# Patient Record
Sex: Male | Born: 1975 | Race: Black or African American | Hispanic: No | Marital: Single | Smoking: Former smoker
Health system: Southern US, Community
[De-identification: ages and names within clinical notes are randomized; demographics above are authoritative.]

## PROBLEM LIST (undated history)

## (undated) DIAGNOSIS — S86019A Strain of unspecified Achilles tendon, initial encounter: Secondary | ICD-10-CM

## (undated) DIAGNOSIS — R7303 Prediabetes: Secondary | ICD-10-CM

## (undated) DIAGNOSIS — I509 Heart failure, unspecified: Secondary | ICD-10-CM

## (undated) DIAGNOSIS — I5040 Unspecified combined systolic (congestive) and diastolic (congestive) heart failure: Secondary | ICD-10-CM

## (undated) DIAGNOSIS — I10 Essential (primary) hypertension: Secondary | ICD-10-CM

## (undated) DIAGNOSIS — R0683 Snoring: Secondary | ICD-10-CM

## (undated) DIAGNOSIS — Z0189 Encounter for other specified special examinations: Secondary | ICD-10-CM

## (undated) DIAGNOSIS — I214 Non-ST elevation (NSTEMI) myocardial infarction: Secondary | ICD-10-CM

## (undated) HISTORY — DX: Encounter for other specified special examinations: Z01.89

## (undated) HISTORY — DX: Prediabetes: R73.03

## (undated) HISTORY — DX: Unspecified combined systolic (congestive) and diastolic (congestive) heart failure: I50.40

## (undated) HISTORY — DX: Non-ST elevation (NSTEMI) myocardial infarction: I21.4

---

## 2007-11-12 ENCOUNTER — Other Ambulatory Visit (HOSPITAL_COMMUNITY): Admission: RE | Admit: 2007-11-12 | Discharge: 2008-01-17 | Payer: Self-pay | Admitting: Psychiatry

## 2007-11-15 ENCOUNTER — Ambulatory Visit: Payer: Self-pay | Admitting: Psychiatry

## 2010-02-25 ENCOUNTER — Ambulatory Visit (HOSPITAL_COMMUNITY): Admission: RE | Admit: 2010-02-25 | Payer: Self-pay | Source: Home / Self Care | Admitting: General Surgery

## 2010-12-13 LAB — URINE DRUGS OF ABUSE SCREEN W ALC, ROUTINE (REF LAB)
Barbiturate Quant, Ur: NEGATIVE
Barbiturate Quant, Ur: NEGATIVE
Benzodiazepines.: NEGATIVE
Benzodiazepines.: NEGATIVE
Ethyl Alcohol: <10
Marijuana Metabolite: NEGATIVE
Marijuana Metabolite: NEGATIVE
Methadone: NEGATIVE
Methadone: NEGATIVE
Phencyclidine (PCP): NEGATIVE
Phencyclidine (PCP): NEGATIVE
Propoxyphene: NEGATIVE
Propoxyphene: NEGATIVE

## 2010-12-14 LAB — URINE DRUGS OF ABUSE SCREEN W ALC, ROUTINE (REF LAB)
Barbiturate Quant, Ur: NEGATIVE
Barbiturate Quant, Ur: NEGATIVE
Barbiturate Quant, Ur: NEGATIVE
Benzodiazepines.: NEGATIVE
Benzodiazepines.: NEGATIVE
Benzodiazepines.: NEGATIVE
Ethyl Alcohol: <10
Ethyl Alcohol: <10
Marijuana Metabolite: NEGATIVE
Marijuana Metabolite: NEGATIVE
Marijuana Metabolite: NEGATIVE
Methadone: NEGATIVE
Methadone: NEGATIVE
Methadone: NEGATIVE
Phencyclidine (PCP): NEGATIVE
Phencyclidine (PCP): NEGATIVE
Phencyclidine (PCP): NEGATIVE
Propoxyphene: NEGATIVE

## 2010-12-15 LAB — URINE DRUGS OF ABUSE SCREEN W ALC, ROUTINE (REF LAB)
Amphetamine Screen, Ur: NEGATIVE
Amphetamine Screen, Ur: NEGATIVE
Barbiturate Quant, Ur: NEGATIVE
Benzodiazepines.: NEGATIVE
Cocaine Metabolites: NEGATIVE
Creatinine,U: 247.8
Creatinine,U: 284.3
Ethyl Alcohol: <10
Ethyl Alcohol: <10
Marijuana Metabolite: POSITIVE — AB
Opiate Screen, Urine: NEGATIVE
Opiate Screen, Urine: NEGATIVE

## 2010-12-15 LAB — THC (MARIJUANA), URINE, CONFIRMATION: Marijuana, Ur-Confirmation: 230 ng/mL

## 2013-03-17 ENCOUNTER — Emergency Department (HOSPITAL_COMMUNITY)
Admission: EM | Admit: 2013-03-17 | Discharge: 2013-03-17 | Disposition: A | Discharge status: Home / Self Care | Payer: Self-pay | Attending: Emergency Medicine | Admitting: Emergency Medicine

## 2013-03-17 ENCOUNTER — Encounter (HOSPITAL_COMMUNITY): Payer: Self-pay | Admitting: Emergency Medicine

## 2013-03-17 ENCOUNTER — Emergency Department (HOSPITAL_COMMUNITY): Payer: Self-pay

## 2013-03-17 DIAGNOSIS — S93499A Sprain of other ligament of unspecified ankle, initial encounter: Secondary | ICD-10-CM | POA: Insufficient documentation

## 2013-03-17 DIAGNOSIS — X500XXA Overexertion from strenuous movement or load, initial encounter: Secondary | ICD-10-CM | POA: Insufficient documentation

## 2013-03-17 DIAGNOSIS — F17200 Nicotine dependence, unspecified, uncomplicated: Secondary | ICD-10-CM | POA: Insufficient documentation

## 2013-03-17 DIAGNOSIS — Y92838 Other recreation area as the place of occurrence of the external cause: Secondary | ICD-10-CM

## 2013-03-17 DIAGNOSIS — S86012A Strain of left Achilles tendon, initial encounter: Secondary | ICD-10-CM

## 2013-03-17 DIAGNOSIS — Y9239 Other specified sports and athletic area as the place of occurrence of the external cause: Secondary | ICD-10-CM | POA: Insufficient documentation

## 2013-03-17 DIAGNOSIS — S96819A Strain of other specified muscles and tendons at ankle and foot level, unspecified foot, initial encounter: Principal | ICD-10-CM

## 2013-03-17 DIAGNOSIS — Y9367 Activity, basketball: Secondary | ICD-10-CM | POA: Insufficient documentation

## 2013-03-17 MED ORDER — TRAMADOL HCL 50 MG PO TABS: 50.0000 mg | ORAL_TABLET | Freq: Four times a day (QID) | ORAL | Status: DC | PRN

## 2013-03-17 NOTE — Discharge Instructions (Signed)
Complete Achilles Tendon Rupture Tendons are the tough, fibrous, and stretchy (elastic) tissues that connect muscle to bone. The Achilles tendon is the large cord-like structure (tendon) in the back of the leg just above the foot. It attaches the large muscles of the lower leg to the heel bone. You can feel this as the large cord just above the heel. The diagnosis of complete Achilles tendon tear (rupture) is made by examination. You are not able to stand up on the toes of the injured side with this injury. X-rays will determine the extent of the injury. Surgical repair with casting is necessary with complete rupture of the tendon. Surgery allows the surgeon to put the tendon back together. The cast is used to allow the repair time to heal. The injury may be casted or immobilized for 6 to 10 weeks. Immobilization means that the tendon injured is kept in position with a cast or splint. Once your caregiver feels you have healed well enough, he or she will provide exercises you can do to make the injured tendon feel better (rehabilitate). HOME CARE INSTRUCTIONS   Apply ice to the injury for 15-20 minutes, 03-04 times per day. Put the ice in a plastic bag and place a towel between the bag of ice and your skin, splint, or immobilization device.  Use crutches and move about only as instructed.  Keep the leg elevated above the level of the heart (the center of the chest) at all times when not using the bathroom. Do not dangle the leg over a chair, couch, or bed. When lying down, elevate your leg on a few pillows. Elevation prevents swelling and reduces pain.  Avoid use other than gentle range of motion of the toes.  Do not drive a car until your caregiver specifically tells you it is safe to do so.  Only take over-the-counter or prescription medicines for pain, discomfort, or fever as directed by your caregiver.  If your caregiver has given you a follow-up appointment, it is very important to keep that  appointment. Not keeping the appointment could result in a chronic or permanent injury, pain, and disability. If there is any problem keeping the appointment, you must call back to this facility for assistance. SEEK MEDICAL CARE IF:   Your pain and swelling increase, or pain is uncontrolled with medications.  You develop new unexplained problems (symptoms) or an increase of the symptoms that brought you to your caregiver.  You develop an inability to move your toes or foot, develop warmth and swelling in your foot, or begin running an unexplained fever. MAKE SURE YOU:   Understand these instructions.  Will watch your condition.  Will get help right away if you are not doing well or get worse. Document Released: 12/08/2004 Document Revised: 05/23/2011 Document Reviewed: 10/19/2012 [facility] Patient Information 2014 [place], [place].

## 2013-03-17 NOTE — ED Notes (Signed)
Pt states he was playing basketball when he stepped down and felt something strain in the back of his L ankle. Pt states he has pain there. Able to ambulate.

## 2013-03-17 NOTE — ED Provider Notes (Signed)
CSN: [id number]     Arrival date & time 03/17/13  1651 History  This chart was scribed for Joseph Ray Signer, PA-C, working with Rolland PorterMark James, MD, by Ardelia Memsylan Malpass ED Scribe. This patient was seen in room WTR6/WTR6 and the patient's care was started at 6:44 PM.   Chief Complaint  Patient presents with  . Ankle Pain    The history is provided by the patient. No language interpreter was used.    HPI Comments: Joseph Ray is a 38 y.o. male who presents to the Emergency Department complaining of a left ankle injury that occurred earlier today. He states that the back of his left heel was stepped on while playing basketball, and that he felt a "pop" at that time. He states that he has no pain in his ankle, but that he has pain in his posterior left lower leg. He also states that he has had left ankle swelling since the injury. He states that his pain is worsened with walking, and he is in a wheelchair that he received in triage. He states that he has taken nothing for pain since the injury occurred. He denies any other pain or symptoms.   History reviewed. No pertinent past medical history. History reviewed. No pertinent past surgical history. History reviewed. No pertinent family history. History  Substance Use Topics  . Smoking status: Current Every Day Smoker  . Smokeless tobacco: Not on file  . Alcohol Use: No    Review of Systems  Musculoskeletal: Positive for arthralgias (left posterior ankle) and joint swelling (left ankle).  All other systems reviewed and are negative.    Allergies  Review of patient's allergies indicates not on file.  Home Medications   Current Outpatient Rx  Name  Route  Sig  Dispense  Refill  . traMADol (ULTRAM) 50 MG tablet   Oral   Take 1 tablet (50 mg total) by mouth every 6 (six) hours as needed.   15 tablet   0     Triage Vitals: BP 158/113  Pulse 105  Temp(Src) 98.4 F (36.9 C) (Oral)  Resp 16  SpO2 95%  Physical Exam  Nursing note and  vitals reviewed. Constitutional: He is oriented to person, place, and time. He appears well-developed and well-nourished. No distress.  HENT:  Head: Normocephalic and atraumatic.  Eyes: EOM are normal.  Neck: Neck supple. No tracheal deviation present.  Cardiovascular: Normal rate.   Pulmonary/Chest: Effort normal. No respiratory distress.  Musculoskeletal: Normal range of motion.       Left ankle: Achilles tendon exhibits defect and abnormal Thompson's test results.  Rupture of left achilles tendon  Neurological: He is alert and oriented to person, place, and time.  Skin: Skin is warm and dry.  Psychiatric: He has a normal mood and affect. His behavior is normal.    ED Course  Procedures (including critical care time)  DIAGNOSTIC STUDIES: Oxygen Saturation is 95% on RA, adequate by my interpretation.    COORDINATION OF CARE: 6:49 PM- Discussed radiology findings and clinical suspicion that pt tore his left achilles tendon. Advised pt of plan to have his ankle immobilized, and to follow-up with an Orthopedic Surgeon. Will discharge with Ultram. Pt advised of plan for treatment and pt agrees.  6:57 PM- Consulted Orthopedic Surgery regarding pt's case who advised that pt not put any pressure on his left foot, and that pt receive a posterior splint and be in crutches  Labs Review Labs Reviewed - No data to display  Imaging Review Dg Ankle Complete Left  03/17/2013   CLINICAL DATA:  Sports injury  EXAM: LEFT ANKLE COMPLETE - 3+ VIEW  COMPARISON:  None.  FINDINGS: Ankle mortise intact. The talar dome is normal. No malleolar fracture. No joint effusion. Calcaneus is normal.  IMPRESSION: No left ankle fracture.   Electronically Signed   By: Genevive Bi M.D.   On: 03/17/2013 18:36    EKG Interpretation   None       MDM   1. Achilles rupture, left, initial encounter    38 y.o.Joseph Ray's evaluation in the Emergency Department is complete. It has been determined that no  acute conditions requiring further emergency intervention are present at this time. The patient/guardian have been advised of the diagnosis and plan. We have discussed signs and symptoms that warrant return to the ED, such as changes or worsening in symptoms.  Vital signs are stable at discharge. Filed Vitals:   03/17/13 1706  BP: 158/113  Pulse: 105  Temp: 98.4 F (36.9 C)  Resp: 16    Patient/guardian has voiced understanding and agreed to follow-up with the PCP or specialist.  I personally performed the services described in this documentation, which was scribed in my presence. The recorded information has been reviewed and is accurate.   Dorthula Matas, PA-C 03/17/13 1901

## 2013-03-18 NOTE — Progress Notes (Signed)
Patient transferred to this RN asking about his discharge paperwork given to him by the [facility] ED staff.  He is from [facility] and his paperwork says for him to follow up with an orthopedic surgeon in [place], [place]?  I explained to him that I was not sure how he got me on the phone and I gave him the phone number for [facility] ED in hopes that they could help.  Lance Bosch, RN

## 2013-03-22 ENCOUNTER — Other Ambulatory Visit: Payer: Self-pay | Admitting: Orthopedic Surgery

## 2013-03-22 ENCOUNTER — Encounter (HOSPITAL_BASED_OUTPATIENT_CLINIC_OR_DEPARTMENT_OTHER): Payer: Self-pay | Admitting: *Deleted

## 2013-03-23 NOTE — H&P (Signed)
Joseph Ray is an 38 y.o. male.    Chief Complaint: Left Achilles Rupture  HPI: Joseph DresserConnie is a 38 year old new patient comes in for evaluation and management of a suspected left Achilles tendon rupture.  He reports while plan basketball 6 days ago the back of his left ankle was stepped on.  Subsequent felt a pop.  He was seen in the ER on 03/17/13.  X-rays of his ankle were performed.  His evaluation led to a diagnosis of a Achilles tendon rupture.  He was placed in a posterior splint and put on crutches and sent here for further evaluation and management.  He denies any prior problems.  He is employed as an job that'll require some to be on his feet up to 75% of the time.  Past Medical History  Diagnosis Date  . Achilles tendon tear     left  . Snores     History reviewed. No pertinent past surgical history.  History reviewed. No pertinent family history. Social History:  reports that he has been smoking.  He does not have any smokeless tobacco history on file. He reports that he drinks alcohol. He reports that he does not use illicit drugs.  Allergies:  Allergies  Allergen Reactions  . Penicillins Rash    No prescriptions prior to admission    No results found for this or any previous visit (from the past 48 hour(s)). No results found.  Review of Systems  Constitutional: Negative.   HENT: Negative.   Eyes: Negative.   Respiratory: Negative.   Cardiovascular: Negative.   Gastrointestinal: Negative.   Genitourinary: Negative.   Musculoskeletal: Positive for joint pain.  Skin: Negative.   Neurological: Negative.   Endo/Heme/Allergies: Negative.   Psychiatric/Behavioral: Negative.     Height 6\' 2"  (1.88 m), weight 124.739 kg (275 lb). Physical Exam  Constitutional: He is oriented to person, place, and time. He appears well-developed and well-nourished.  HENT:  Head: Normocephalic and atraumatic.  Eyes: Pupils are equal, round, and reactive to light.  Neck: Normal range  of motion.  Cardiovascular: Intact distal pulses.   Respiratory: Effort normal.  Musculoskeletal: He exhibits edema and tenderness.  Examination of his left foot and ankle reveals he has moderate swelling around his ankle.  He has tenderness at the proximal portion of the Achilles tendon with a palpable defect.  He is unable to dorsiflex his left foot.  He has increased pain with resistant dorsi flexion of his left foot.  4+5 strength with resistant plantar flexion.  Sensations intact.  He does have some ecchymosis towards the posterior aspect of his ankle.  Skin exam: No signs of this infection.  No breakdown.  No rash.   Neurological: He is alert and oriented to person, place, and time.  Skin: Skin is warm and dry.  Psychiatric: He has a normal mood and affect. His behavior is normal. Judgment and thought content normal.     Assessment/Plan 1.  Left Achilles tendon rupture  Plan:  He was placed in a hinge Cam Walker locked at 45.  He's remain nonweightbearing.  I did discuss the anatomy of the tendon rupture with the patient as well as operative and nonoperative treatment.  The patient would like to proceed with operative treatment since it has the best chance of giving him a strong repair with a lower probability of re rupturing as he wants to remain active.  We'll try to get the surgery set up for him at his convenience sometime  in the next 4-7 days.  Libbi Towner R 03/23/2013, 12:48 PM

## 2013-03-25 ENCOUNTER — Ambulatory Visit (HOSPITAL_BASED_OUTPATIENT_CLINIC_OR_DEPARTMENT_OTHER): Payer: Self-pay | Admitting: Certified Registered"

## 2013-03-25 ENCOUNTER — Encounter (HOSPITAL_BASED_OUTPATIENT_CLINIC_OR_DEPARTMENT_OTHER)
Admission: RE | Disposition: A | Discharge status: Home / Self Care | Payer: Self-pay | Source: Ambulatory Visit | Attending: Orthopedic Surgery

## 2013-03-25 ENCOUNTER — Encounter (HOSPITAL_BASED_OUTPATIENT_CLINIC_OR_DEPARTMENT_OTHER): Payer: Self-pay

## 2013-03-25 ENCOUNTER — Encounter (HOSPITAL_BASED_OUTPATIENT_CLINIC_OR_DEPARTMENT_OTHER): Payer: Self-pay | Admitting: Certified Registered"

## 2013-03-25 ENCOUNTER — Ambulatory Visit (HOSPITAL_BASED_OUTPATIENT_CLINIC_OR_DEPARTMENT_OTHER)
Admission: RE | Admit: 2013-03-25 | Discharge: 2013-03-25 | Disposition: A | Discharge status: Home / Self Care | Payer: Self-pay | Source: Ambulatory Visit | Attending: Orthopedic Surgery | Admitting: Orthopedic Surgery

## 2013-03-25 DIAGNOSIS — S93499A Sprain of other ligament of unspecified ankle, initial encounter: Secondary | ICD-10-CM | POA: Insufficient documentation

## 2013-03-25 DIAGNOSIS — X58XXXA Exposure to other specified factors, initial encounter: Secondary | ICD-10-CM | POA: Insufficient documentation

## 2013-03-25 DIAGNOSIS — Z880 Allergy status to penicillin: Secondary | ICD-10-CM | POA: Insufficient documentation

## 2013-03-25 DIAGNOSIS — Y9367 Activity, basketball: Secondary | ICD-10-CM | POA: Insufficient documentation

## 2013-03-25 DIAGNOSIS — S86012A Strain of left Achilles tendon, initial encounter: Secondary | ICD-10-CM

## 2013-03-25 DIAGNOSIS — S96819A Strain of other specified muscles and tendons at ankle and foot level, unspecified foot, initial encounter: Principal | ICD-10-CM

## 2013-03-25 DIAGNOSIS — F17200 Nicotine dependence, unspecified, uncomplicated: Secondary | ICD-10-CM | POA: Insufficient documentation

## 2013-03-25 HISTORY — PX: ACHILLES TENDON SURGERY: SHX542

## 2013-03-25 HISTORY — DX: Strain of unspecified achilles tendon, initial encounter: S86.019A

## 2013-03-25 HISTORY — DX: Snoring: R06.83

## 2013-03-25 LAB — POCT HEMOGLOBIN-HEMACUE: HEMOGLOBIN: 15.7 g/dL (ref 13.0–17.0)

## 2013-03-25 SURGERY — REPAIR, TENDON, ACHILLES
Anesthesia: General | Site: Leg Lower | Laterality: Left

## 2013-03-25 MED ORDER — BUPIVACAINE-EPINEPHRINE PF 0.5-1:200000 % IJ SOLN
INTRAMUSCULAR | Status: DC | PRN
  Administered 2013-03-25: 30 mL via PERINEURAL

## 2013-03-25 MED ORDER — ONDANSETRON HCL 4 MG/2ML IJ SOLN
INTRAMUSCULAR | Status: DC | PRN
  Administered 2013-03-25: 4 mg via INTRAVENOUS

## 2013-03-25 MED ORDER — CEFAZOLIN SODIUM-DEXTROSE 2-3 GM-% IV SOLR
2.0000 g | INTRAVENOUS | Status: AC
  Administered 2013-03-25: 3 g via INTRAVENOUS

## 2013-03-25 MED ORDER — MIDAZOLAM HCL 2 MG/2ML IJ SOLN
INTRAMUSCULAR | Status: AC
  Filled 2013-03-25: qty 2

## 2013-03-25 MED ORDER — MIDAZOLAM HCL 5 MG/5ML IJ SOLN
INTRAMUSCULAR | Status: DC | PRN
Start: 2013-03-25 — End: 2013-03-25
  Administered 2013-03-25: 2 mg via INTRAVENOUS

## 2013-03-25 MED ORDER — ONDANSETRON HCL 4 MG/2ML IJ SOLN
4.0000 mg | Freq: Once | INTRAMUSCULAR | Status: DC | PRN
Start: 2013-03-25 — End: 2013-03-25

## 2013-03-25 MED ORDER — PROPOFOL 10 MG/ML IV BOLUS
INTRAVENOUS | Status: DC | PRN
  Administered 2013-03-25: 300 mg via INTRAVENOUS

## 2013-03-25 MED ORDER — OXYCODONE HCL 5 MG PO TABS
5.0000 mg | ORAL_TABLET | Freq: Once | ORAL | Status: AC | PRN
  Administered 2013-03-25: 5 mg via ORAL
  Filled 2013-03-25: qty 1

## 2013-03-25 MED ORDER — SUCCINYLCHOLINE CHLORIDE 20 MG/ML IJ SOLN
INTRAMUSCULAR | Status: DC | PRN
  Administered 2013-03-25: 100 mg via INTRAVENOUS

## 2013-03-25 MED ORDER — CHLORHEXIDINE GLUCONATE 4 % EX LIQD: 60.0000 mL | Freq: Once | CUTANEOUS | Status: DC

## 2013-03-25 MED ORDER — HYDROMORPHONE HCL PF 1 MG/ML IJ SOLN
0.2500 mg | INTRAMUSCULAR | Status: DC | PRN
  Administered 2013-03-25 (×3): 0.5 mg via INTRAVENOUS

## 2013-03-25 MED ORDER — FENTANYL CITRATE 0.05 MG/ML IJ SOLN
INTRAMUSCULAR | Status: AC
  Filled 2013-03-25: qty 2

## 2013-03-25 MED ORDER — BUPIVACAINE HCL (PF) 0.5 % IJ SOLN
INTRAMUSCULAR | Status: AC
  Filled 2013-03-25: qty 30

## 2013-03-25 MED ORDER — HYDROMORPHONE HCL PF 1 MG/ML IJ SOLN
INTRAMUSCULAR | Status: AC
Start: 2013-03-25 — End: 2013-03-25
  Filled 2013-03-25: qty 1

## 2013-03-25 MED ORDER — HYDROMORPHONE HCL PF 1 MG/ML IJ SOLN
INTRAMUSCULAR | Status: AC
  Filled 2013-03-25: qty 1

## 2013-03-25 MED ORDER — DEXTROSE-NACL 5-0.45 % IV SOLN: INTRAVENOUS | Status: DC

## 2013-03-25 MED ORDER — OXYCODONE HCL 5 MG/5ML PO SOLN: 5.0000 mg | Freq: Once | ORAL | Status: AC | PRN

## 2013-03-25 MED ORDER — CEFAZOLIN SODIUM-DEXTROSE 2-3 GM-% IV SOLR
INTRAVENOUS | Status: AC
  Filled 2013-03-25: qty 50

## 2013-03-25 MED ORDER — DEXAMETHASONE SODIUM PHOSPHATE 4 MG/ML IJ SOLN
INTRAMUSCULAR | Status: DC | PRN
  Administered 2013-03-25: 10 mg via INTRAVENOUS

## 2013-03-25 MED ORDER — EPHEDRINE SULFATE 50 MG/ML IJ SOLN
INTRAMUSCULAR | Status: DC | PRN
  Administered 2013-03-25 (×2): 10 mg via INTRAVENOUS

## 2013-03-25 MED ORDER — FENTANYL CITRATE 0.05 MG/ML IJ SOLN
INTRAMUSCULAR | Status: DC | PRN
  Administered 2013-03-25: 50 ug via INTRAVENOUS

## 2013-03-25 MED ORDER — FENTANYL CITRATE 0.05 MG/ML IJ SOLN
50.0000 ug | INTRAMUSCULAR | Status: DC | PRN
  Administered 2013-03-25: 100 ug via INTRAVENOUS

## 2013-03-25 MED ORDER — FENTANYL CITRATE 0.05 MG/ML IJ SOLN
INTRAMUSCULAR | Status: AC
  Filled 2013-03-25: qty 6

## 2013-03-25 MED ORDER — MIDAZOLAM HCL 2 MG/2ML IJ SOLN
1.0000 mg | INTRAMUSCULAR | Status: DC | PRN
  Administered 2013-03-25: 2 mg via INTRAVENOUS

## 2013-03-25 MED ORDER — HYDROCODONE-ACETAMINOPHEN 5-325 MG PO TABS: 1.0000 | ORAL_TABLET | Freq: Four times a day (QID) | ORAL | Status: DC | PRN

## 2013-03-25 MED ORDER — BUPIVACAINE-EPINEPHRINE PF 0.5-1:200000 % IJ SOLN
INTRAMUSCULAR | Status: AC
  Filled 2013-03-25: qty 30

## 2013-03-25 MED ORDER — LIDOCAINE HCL (CARDIAC) 20 MG/ML IV SOLN
INTRAVENOUS | Status: DC | PRN
  Administered 2013-03-25: 30 mg via INTRAVENOUS

## 2013-03-25 MED ORDER — LACTATED RINGERS IV SOLN
INTRAVENOUS | Status: DC
  Administered 2013-03-25: 13:00:00 via INTRAVENOUS

## 2013-03-25 SURGICAL SUPPLY — 72 items
BANDAGE ELASTIC 4 VELCRO ST LF (GAUZE/BANDAGES/DRESSINGS) ×3 IMPLANT
BANDAGE ELASTIC 6 VELCRO ST LF (GAUZE/BANDAGES/DRESSINGS) ×2 IMPLANT
BANDAGE ESMARK 6X9 LF (GAUZE/BANDAGES/DRESSINGS) ×1 IMPLANT
BLADE SURG 10 STRL SS (BLADE) ×3 IMPLANT
BLADE SURG 15 STRL LF DISP TIS (BLADE) ×1 IMPLANT
BLADE SURG 15 STRL SS (BLADE) ×3
BNDG CMPR 9X6 STRL LF SNTH (GAUZE/BANDAGES/DRESSINGS) ×1
BNDG ESMARK 6X9 LF (GAUZE/BANDAGES/DRESSINGS) ×3
COVER TABLE BACK 60X90 (DRAPES) ×3 IMPLANT
CUFF TOURNIQUET SINGLE 24IN (TOURNIQUET CUFF) ×2 IMPLANT
CUFF TOURNIQUET SINGLE 34IN LL (TOURNIQUET CUFF) IMPLANT
DECANTER SPIKE VIAL GLASS SM (MISCELLANEOUS) IMPLANT
DRAPE EXTREMITY T 121X128X90 (DRAPE) ×3 IMPLANT
DRAPE U 20/CS (DRAPES) ×3 IMPLANT
DRAPE U-SHAPE 47X51 STRL (DRAPES) ×3 IMPLANT
DURAPREP 26ML APPLICATOR (WOUND CARE) ×3 IMPLANT
ELECT REM PT RETURN 9FT ADLT (ELECTROSURGICAL) ×3
ELECTRODE REM PT RTRN 9FT ADLT (ELECTROSURGICAL) ×1 IMPLANT
GAUZE SPONGE 4X4 16PLY XRAY LF (GAUZE/BANDAGES/DRESSINGS) IMPLANT
GAUZE XEROFORM 1X8 LF (GAUZE/BANDAGES/DRESSINGS) ×3 IMPLANT
GLOVE BIO SURGEON STRL SZ7.5 (GLOVE) ×5 IMPLANT
GLOVE BIO SURGEON STRL SZ8.5 (GLOVE) ×3 IMPLANT
GLOVE BIOGEL PI IND STRL 7.0 (GLOVE) ×1 IMPLANT
GLOVE BIOGEL PI IND STRL 8 (GLOVE) ×1 IMPLANT
GLOVE BIOGEL PI IND STRL 9 (GLOVE) ×1 IMPLANT
GLOVE BIOGEL PI INDICATOR 7.0 (GLOVE) ×4
GLOVE BIOGEL PI INDICATOR 8 (GLOVE) ×2
GLOVE BIOGEL PI INDICATOR 9 (GLOVE) ×2
GLOVE ECLIPSE 7.0 STRL STRAW (GLOVE) ×2 IMPLANT
GLOVE SURG SS PI 8.0 STRL IVOR (GLOVE) ×2 IMPLANT
GOWN STRL REUS W/ TWL LRG LVL3 (GOWN DISPOSABLE) ×1 IMPLANT
GOWN STRL REUS W/ TWL XL LVL3 (GOWN DISPOSABLE) ×2 IMPLANT
GOWN STRL REUS W/TWL LRG LVL3 (GOWN DISPOSABLE) ×3
GOWN STRL REUS W/TWL XL LVL3 (GOWN DISPOSABLE) ×8 IMPLANT
NDL HYPO 25X1 1.5 SAFETY (NEEDLE) IMPLANT
NEEDLE HYPO 25X1 1.5 SAFETY (NEEDLE) IMPLANT
NS IRRIG 1000ML POUR BTL (IV SOLUTION) ×3 IMPLANT
PAD CAST 4YDX4 CTTN HI CHSV (CAST SUPPLIES) ×1 IMPLANT
PADDING CAST ABS 4INX4YD NS (CAST SUPPLIES) ×2
PADDING CAST ABS COTTON 4X4 ST (CAST SUPPLIES) ×1 IMPLANT
PADDING CAST COTTON 4X4 STRL (CAST SUPPLIES) ×3
PADDING CAST SYNTHETIC 4 (CAST SUPPLIES)
PADDING CAST SYNTHETIC 4X4 STR (CAST SUPPLIES) IMPLANT
PASSER SUT SWANSON 36MM LOOP (INSTRUMENTS) IMPLANT
PENCIL BUTTON HOLSTER BLD 10FT (ELECTRODE) ×3 IMPLANT
SCOTCHCAST PLUS 4X4 WHITE (CAST SUPPLIES) IMPLANT
SLEEVE SCD COMPRESS KNEE MED (MISCELLANEOUS) ×2 IMPLANT
SPONGE GAUZE 4X4 12PLY (GAUZE/BANDAGES/DRESSINGS) ×3 IMPLANT
SPONGE LAP 4X18 X RAY DECT (DISPOSABLE) ×3 IMPLANT
STOCKINETTE 6  STRL (DRAPES)
STOCKINETTE 6 STRL (DRAPES) IMPLANT
SUCTION FRAZIER TIP 10 FR DISP (SUCTIONS) IMPLANT
SUT ETHIBOND 2 OS 4 DA (SUTURE) IMPLANT
SUT ETHILON 3 0 PS 1 (SUTURE) ×1 IMPLANT
SUT FIBERWIRE #2 38 T-5 BLUE (SUTURE) ×6
SUT FIBERWIRE #5 38 CONV NDL (SUTURE)
SUT VIC AB 2-0 CT1 27 (SUTURE)
SUT VIC AB 2-0 CT1 TAPERPNT 27 (SUTURE) ×1 IMPLANT
SUT VIC AB 3-0 FS2 27 (SUTURE) IMPLANT
SUT VIC AB 3-0 SH 27 (SUTURE)
SUT VIC AB 3-0 SH 27X BRD (SUTURE) ×1 IMPLANT
SUT VIC AB 3-0 X1 27 (SUTURE) ×2 IMPLANT
SUTURE FIBERWR #2 38 T-5 BLUE (SUTURE) IMPLANT
SUTURE FIBERWR #5 38 CONV NDL (SUTURE) IMPLANT
SYR BULB 3OZ (MISCELLANEOUS) ×5 IMPLANT
SYR BULB IRRIGATION 50ML (SYRINGE) ×1 IMPLANT
SYR CONTROL 10ML LL (SYRINGE) IMPLANT
TOWEL OR 17X24 6PK STRL BLUE (TOWEL DISPOSABLE) ×3 IMPLANT
TUBE CONNECTING 20'X1/4 (TUBING) ×1
TUBE CONNECTING 20X1/4 (TUBING) ×1 IMPLANT
UNDERPAD 30X30 INCONTINENT (UNDERPADS AND DIAPERS) ×3 IMPLANT
YANKAUER SUCT BULB TIP NO VENT (SUCTIONS) ×3 IMPLANT

## 2013-03-25 NOTE — Anesthesia Procedure Notes (Addendum)
Anesthesia Regional Block:  Popliteal block  Pre-Anesthetic Checklist: ,, timeout performed, Correct Patient, Correct Site, Correct Laterality, Correct Procedure, Correct Position, site marked, Risks and benefits discussed,  Surgical consent,  Pre-op evaluation,  At surgeon's request and post-op pain management  Laterality: Left and Lower  Prep: chloraprep       Needles:  Injection technique: Single-shot  Needle Type: Echogenic Needle     Needle Length: 9cm  Needle Gauge: 21 and 21 G    Additional Needles:  Procedures: ultrasound guided (picture in chart) Popliteal block Narrative:  Start time: 03/25/2013 1:20 PM End time: 03/25/2013 1:27 PM Injection made incrementally with aspirations every 5 mL.  Performed by: Personally  Anesthesiologist: Lorrene Reid, MD   Procedure Name: Intubation Date/Time: 03/25/2013 1:33 PM Performed by: Eric Nees Pre-anesthesia Checklist: Patient identified, Emergency Drugs available, Suction available and Patient being monitored Patient Re-evaluated:Patient Re-evaluated prior to inductionOxygen Delivery Method: Circle System Utilized Preoxygenation: Pre-oxygenation with 100% oxygen Intubation Type: IV induction Ventilation: Mask ventilation without difficulty Tube type: Oral Tube size: 7.0 mm Number of attempts: 1 Airway Equipment and Method: stylet and LTA kit utilized Placement Confirmation: ETT inserted through vocal cords under direct vision,  positive ETCO2 and breath sounds checked- equal and bilateral Secured at: 24 cm Tube secured with: Tape Dental Injury: Teeth and Oropharynx as per pre-operative assessment

## 2013-03-25 NOTE — Op Note (Signed)
Pre Op Dx: Left Achilles tendon rupture  Post Op Dx: Same  Procedure: Left Achilles tendon repair  Surgeon: [name], MD  Assistant: Tomi Likens. Gaylene Brooks  (present throughout entire procedure and necessary for timely completion of the procedure)  Anesthesia: General  EBL: Minimal  Fluids: 800 cc crystalloid  Tourniquet Time: 30 minutes  Indications: Patient was playing basketball last week and sustained a left Achilles tendon rupture. Was seen by my partner Dr. Althea Charon or clinical diagnosis was made and confirmed by ultrasound examination. The patient is very active and desires elective repair of the Achilles tendon to minimize the chance of recurrence and maximize function. Risks and benefits of surgical and nonsurgical treatment discussed with the patient  Procedure: The patient identified by arm band and received 3 g Ancef in the holding area at the day surgery center. He was then taken to the operating room where the appropriate anesthetic monitors were attached and general endotracheal anesthesia induced while he was supine in the gurney. He was then placed in the prone position on the operating table with well-padded chest rolls. Tourniquet was applied to the calf and the left lower extremity was prepped and draped in the usual sterile fashion from the toes to the tourniquet. Timeout procedure was performed, 1 wrapped with an Esmarch bandage and the tourniquet inflated to 300 mm of mercury. A 6 cm incision centered over the Achilles tendon rupture defect was made through the skin and subcutaneous into the sheath of the Achilles tendon. The ends of the ruptured tendon were positively identified and trimmed back to healthy tissue and then each stump had a #2 FiberWire whip stitch running for 4 throws up the medial side and 4 throws down the lateral side so there were 2 central core sutures on each stump. The knee was flexed and the foot plantar flexed allowing [place] to over reduce the  tendon as the core sutures were tied and then run proximally and distally with a whipstitch one more time and again tied giving [place] for core sutures. With the knee in extension the foot could be brought to neutral with ring of the tendon whatsoever. The tourniquet was let down small bleeders identified and cauterized the tendon sheath and subcutaneous tissue are closed running 3-0 Vicryl suture and the skin with running subcuticular 3-0 Vicryl suture. A dressing of Xeroform 4 x 4 dressings on his web roll Ace wrap and a Cam Walker boot articulated and plantar flex to 30 was applied. The patient was rolled supine awakened extubated and taken to the recovery without difficulty.

## 2013-03-25 NOTE — Discharge Instructions (Signed)
Achilles Tendon Repair  Care After AFTER THE PROCEDURE  You will be taken to the recovery area where a nurse will watch and check your progress. Once you are awake, stable, and taking fluids well, if there are no other problems, you will be allowed to go home.  If this was done as a same day surgery, make sure to have a responsible adult with you for the first 24 hours following your surgery.  Do not drink alcohol, drive a car, use public transportation, or sign important papers for at least one day after surgery.  Do not resume physical activities until directed by your caregiver and surgeon.  Apply ice to the operative site for 15-20 minutes, 03-04 times per day for the first 2-3 days, or as directed. Put the ice in a plastic bag and place a towel between the bag of ice and your skin, splint or cast.  Only take over-the-counter or prescription medicines for pain, discomfort, or fever as directed by your caregiver.  You may resume your normal diet as directed.  Change your dressings as directed.  Use crutches and move about only as instructed.  Keep leg raised above the level of the heart (the center of the chest) at all times when not using the bathroom, etc. Do not dangle the leg over a chair, couch or bed. When lying down, raise your leg on a couple of pillows. Keeping your leg up this way prevents swelling and reduces pain.  Avoid use of your leg other than gentle range of motion with your toes. SEEK MEDICAL CARE IF:   There is redness, swelling, or increasing pain in the wound.  There is pus coming from the wound.  There is drainage from a wound lasting longer than one day.  An unexplained oral temperature above 102 F (38.9 C) develops.  You notice a bad smell coming from the wound or dressing.  The wound edges break apart after sutures or staples have been removed.  You develop continuing nausea or vomiting. SEEK IMMEDIATE MEDICAL CARE IF:   Your pain and swelling  increase or pain is uncontrolled with medicine.  You develop new, unexplained symptoms.  You have trouble moving or cannot move your toes or foot, or develop warmth and swelling in your foot, or begin running an unexplained temperature.  You develop a rash.  You have a hard time breathing.  You develop or feel you are developing any reaction or side effects to the medicines given. Document Released: 11/04/2003 Document Revised: 05/23/2011 Document Reviewed: 12/19/2007 [facility] Patient Information 2014 [place].  Regional Anesthesia Blocks  1. Numbness or the inability to move the "blocked" extremity may last from 3-48 hours after placement. The length of time depends on the medication injected and your individual response to the medication. If the numbness is not going away after 48 hours, call your surgeon.  2. The extremity that is blocked will need to be protected until the numbness is gone and the  Strength has returned. Because you cannot feel it, you will need to take extra care to avoid injury. Because it may be weak, you may have difficulty moving it or using it. You may not know what position it is in without looking at it while the block is in effect.  3. For blocks in the legs and feet, returning to weight bearing and walking needs to be done carefully. You will need to wait until the numbness is entirely gone and the strength has returned.  You should be able to move your leg and foot normally before you try and bear weight or walk. You will need someone to be with you when you first try to ensure you do not fall and possibly risk injury.  4. Bruising and tenderness at the needle site are common side effects and will resolve in a few days.  5. Persistent numbness or new problems with movement should be communicated to the surgeon or the [facility] [phone] [facility] [phone]).  Post Anesthesia Home Care Instructions  Activity: Get  plenty of rest for the remainder of the day. A responsible adult should stay with you for 24 hours following the procedure.  For the next 24 hours, DO NOT: -Drive a car -Advertising copywriter -Drink alcoholic beverages -Take any medication unless instructed by your physician -Make any legal decisions or sign important papers.  Meals: Start with liquid foods such as gelatin or soup. Progress to regular foods as tolerated. Avoid greasy, spicy, heavy foods. If nausea and/or vomiting occur, drink only clear liquids until the nausea and/or vomiting subsides. Call your physician if vomiting continues.  Special Instructions/Symptoms: Your throat may feel dry or sore from the anesthesia or the breathing tube placed in your throat during surgery. If this causes discomfort, gargle with warm salt water. The discomfort should disappear within 24 hours.

## 2013-03-25 NOTE — Progress Notes (Signed)
Assisted Dr. Crews with left, ultrasound guided, popliteal block. Side rails up, monitors on throughout procedure. See vital signs in flow sheet. Tolerated Procedure well. 

## 2013-03-25 NOTE — Anesthesia Postprocedure Evaluation (Signed)
  Anesthesia Post-op Note  Patient: Joseph Ray  Procedure(s) Performed: Procedure(s): LEFT ACHILLES TENDON REPAIR (Left)  Patient Location: PACU  Anesthesia Type:GA combined with regional for post-op pain  Level of Consciousness: awake, alert  and oriented  Airway and Oxygen Therapy: Patient Spontanous Breathing  Post-op Pain: mild  Post-op Assessment: Post-op Vital signs reviewed  Post-op Vital Signs: Reviewed  Complications: No apparent anesthesia complications

## 2013-03-25 NOTE — Transfer of Care (Signed)
Immediate Anesthesia Transfer of Care Note  Patient: Joseph Ray  Procedure(s) Performed: Procedure(s): LEFT ACHILLES TENDON REPAIR (Left)  Patient Location: PACU  Anesthesia Type:General  Level of Consciousness: awake and alert   Airway & Oxygen Therapy: Patient Spontanous Breathing and Patient connected to face mask oxygen  Post-op Assessment: Report given to PACU RN and Post -op Vital signs reviewed and stable  Post vital signs: Reviewed and stable  Complications: No apparent anesthesia complications

## 2013-03-25 NOTE — Interval H&P Note (Signed)
History and Physical Interval Note:  03/25/2013 1:14 PM  Joseph Ray  has presented today for surgery, with the diagnosis of LEFT ACHILLE TEAR   The various methods of treatment have been discussed with the patient and family. After consideration of risks, benefits and other options for treatment, the patient has consented to  Procedure(s): LEFT ACHILLES TENDON REPAIR (Left) as a surgical intervention .  The patient's history has been reviewed, patient examined, no change in status, stable for surgery.  I have reviewed the patient's chart and labs.  Questions were answered to the patient's satisfaction.     [name]

## 2013-03-25 NOTE — Anesthesia Preprocedure Evaluation (Signed)
Anesthesia Evaluation  Patient identified by MRN, date of birth, ID band Patient awake    Reviewed: Allergy & Precautions, H&P , NPO status , Patient's Chart, lab work & pertinent test results  Airway Mallampati: I TM Distance: >3 FB Neck ROM: Full    Dental  (+) Teeth Intact and Dental Advisory Given   Pulmonary Current Smoker,  breath sounds clear to auscultation        Cardiovascular Rhythm:Regular Rate:Normal     Neuro/Psych    GI/Hepatic   Endo/Other    Renal/GU      Musculoskeletal   Abdominal   Peds  Hematology   Anesthesia Other Findings   Reproductive/Obstetrics                           Anesthesia Physical Anesthesia Plan  ASA: II  Anesthesia Plan: General   Post-op Pain Management:    Induction: Intravenous  Airway Management Planned: Oral ETT  Additional Equipment:   Intra-op Plan:   Post-operative Plan: Extubation in OR  Informed Consent: I have reviewed the patients History and Physical, chart, labs and discussed the procedure including the risks, benefits and alternatives for the proposed anesthesia with the patient or authorized representative who has indicated his/her understanding and acceptance.   Dental advisory given  Plan Discussed with: CRNA, Anesthesiologist and Surgeon  Anesthesia Plan Comments:         Anesthesia Quick Evaluation

## 2013-03-27 ENCOUNTER — Encounter (HOSPITAL_BASED_OUTPATIENT_CLINIC_OR_DEPARTMENT_OTHER): Payer: Self-pay | Admitting: Orthopedic Surgery

## 2013-03-30 NOTE — ED Provider Notes (Signed)
Medical screening examination/treatment/procedure(s) were performed by non-physician practitioner and as supervising physician I was immediately available for consultation/collaboration.  EKG Interpretation   None         Rolland Porter, MD 03/30/13 [phone]

## 2015-04-16 ENCOUNTER — Emergency Department (HOSPITAL_COMMUNITY)
Admission: EM | Admit: 2015-04-16 | Discharge: 2015-04-16 | Disposition: A | Discharge status: Home / Self Care | Payer: Self-pay | Attending: Emergency Medicine | Admitting: Emergency Medicine

## 2015-04-16 ENCOUNTER — Emergency Department (HOSPITAL_COMMUNITY): Payer: Self-pay

## 2015-04-16 ENCOUNTER — Encounter (HOSPITAL_COMMUNITY): Payer: Self-pay | Admitting: Emergency Medicine

## 2015-04-16 DIAGNOSIS — B349 Viral infection, unspecified: Secondary | ICD-10-CM | POA: Insufficient documentation

## 2015-04-16 DIAGNOSIS — Z880 Allergy status to penicillin: Secondary | ICD-10-CM | POA: Insufficient documentation

## 2015-04-16 DIAGNOSIS — F17200 Nicotine dependence, unspecified, uncomplicated: Secondary | ICD-10-CM | POA: Insufficient documentation

## 2015-04-16 DIAGNOSIS — Z87828 Personal history of other (healed) physical injury and trauma: Secondary | ICD-10-CM | POA: Insufficient documentation

## 2015-04-16 DIAGNOSIS — I10 Essential (primary) hypertension: Secondary | ICD-10-CM | POA: Insufficient documentation

## 2015-04-16 LAB — CBC WITH DIFFERENTIAL/PLATELET
BASOS ABS: 0 10*3/uL (ref 0.0–0.1)
Basophils Relative: 0 %
Eosinophils Absolute: 0.2 10*3/uL (ref 0.0–0.7)
Eosinophils Relative: 3 %
HEMATOCRIT: 42.5 % (ref 39.0–52.0)
HEMOGLOBIN: 14.5 g/dL (ref 13.0–17.0)
LYMPHS PCT: 27 %
Lymphs Abs: 1.7 10*3/uL (ref 0.7–4.0)
MCH: 31.2 pg (ref 26.0–34.0)
MCHC: 34.1 g/dL (ref 30.0–36.0)
MCV: 91.4 fL (ref 78.0–100.0)
Monocytes Absolute: 0.8 10*3/uL (ref 0.1–1.0)
Monocytes Relative: 12 %
NEUTROS ABS: 3.6 10*3/uL (ref 1.7–7.7)
Neutrophils Relative %: 58 %
Platelets: 255 10*3/uL (ref 150–400)
RBC: 4.65 MIL/uL (ref 4.22–5.81)
RDW: 13.7 % (ref 11.5–15.5)
WBC: 6.3 10*3/uL (ref 4.0–10.5)

## 2015-04-16 LAB — I-STAT CHEM 8, ED
BUN: 14 mg/dL (ref 6–20)
CHLORIDE: 106 mmol/L (ref 101–111)
CREATININE: 1 mg/dL (ref 0.61–1.24)
Calcium, Ion: 1.19 mmol/L (ref 1.12–1.23)
Glucose, Bld: 165 mg/dL — ABNORMAL HIGH (ref 65–99)
HEMATOCRIT: 45 % (ref 39.0–52.0)
HEMOGLOBIN: 15.3 g/dL (ref 13.0–17.0)
POTASSIUM: 3.7 mmol/L (ref 3.5–5.1)
Sodium: 141 mmol/L (ref 135–145)
TCO2: 22 mmol/L (ref 0–100)

## 2015-04-16 LAB — I-STAT TROPONIN, ED: Troponin i, poc: 0.01 ng/mL (ref 0.00–0.08)

## 2015-04-16 LAB — RAPID STREP SCREEN (MED CTR MEBANE ONLY): STREPTOCOCCUS, GROUP A SCREEN (DIRECT): NEGATIVE

## 2015-04-16 MED ORDER — BENZONATATE 100 MG PO CAPS: 100.0000 mg | ORAL_CAPSULE | Freq: Three times a day (TID) | ORAL | Status: DC

## 2015-04-16 MED ORDER — BENZONATATE 100 MG PO CAPS
200.0000 mg | ORAL_CAPSULE | Freq: Once | ORAL | Status: AC
  Administered 2015-04-16: 200 mg via ORAL
  Filled 2015-04-16: qty 2

## 2015-04-16 MED ORDER — IBUPROFEN 800 MG PO TABS
800.0000 mg | ORAL_TABLET | Freq: Once | ORAL | Status: AC
  Administered 2015-04-16: 800 mg via ORAL
  Filled 2015-04-16: qty 1

## 2015-04-16 MED ORDER — LORATADINE 10 MG PO TABS
10.0000 mg | ORAL_TABLET | Freq: Once | ORAL | Status: AC
  Administered 2015-04-16: 10 mg via ORAL
  Filled 2015-04-16: qty 1

## 2015-04-16 MED ORDER — ERYTHROMYCIN 5 MG/GM OP OINT: TOPICAL_OINTMENT | OPHTHALMIC | Status: DC

## 2015-04-16 MED ORDER — FLUTICASONE PROPIONATE 50 MCG/ACT NA SUSP: 2.0000 | Freq: Every day | NASAL | Status: DC

## 2015-04-16 MED ORDER — NAPROXEN 375 MG PO TABS: 375.0000 mg | ORAL_TABLET | Freq: Two times a day (BID) | ORAL | Status: DC

## 2015-04-16 NOTE — ED Notes (Signed)
Patient transported to X-ray 

## 2015-04-16 NOTE — ED Notes (Signed)
MD at bedside. 

## 2015-04-16 NOTE — ED Provider Notes (Signed)
CSN: [id number]     Arrival date & time 04/16/15  0255 History  By signing my name below, I, [name], attest that this documentation has been prepared under the direction and in the presence of Soyla Bainter, MD. Electronically Signed: [name], ED Scribe. 04/16/2015. 3:19 AM   No chief complaint on file.  Patient is a 40 y.o. male presenting with URI. The history is provided by the patient. No language interpreter was used.  URI Presenting symptoms: congestion, cough and fever   Presenting symptoms: no ear pain, no rhinorrhea and no sore throat   Congestion:    Location:  Nasal   Interferes with sleep: no     Interferes with eating/drinking: no   Cough:    Cough characteristics:  Non-productive   Severity:  Mild   Onset quality:  Gradual   Timing:  Intermittent   Progression:  Unchanged Severity:  Moderate Onset quality:  Gradual Duration:  1 day Timing:  Constant Progression:  Unchanged Chronicity:  New Relieved by:  Nothing Worsened by:  Nothing tried Associated symptoms: no arthralgias, no headaches, no neck pain, no sneezing and no swollen glands   Risk factors: sick contacts   Risk factors: no chronic cardiac disease, no chronic kidney disease, no chronic respiratory disease, no diabetes mellitus and no recent travel    Joseph Ray is a 40 y.o. male who presents to the Emergency Department complaining of new nasal congestion with onset yesterday. Associated symptoms include fever up to 102 F at home and dry cough. Pt denies chest pain, SOB, N/V/D, and  LE swelling or pain. Pt states that several people at his job are sick. No recent travel. Pt denies history of HTN.   Past Medical History  Diagnosis Date  . Achilles tendon tear     left  . Snores    Past Surgical History  Procedure Laterality Date  . Achilles tendon surgery Left 03/25/2013    Procedure: LEFT ACHILLES TENDON REPAIR;  Surgeon: [name], MD;  Location: [facility];   Service: Orthopedics;  Laterality: Left;   History reviewed. No pertinent family history. Social History  Substance Use Topics  . Smoking status: Current Every Day Smoker -- 1.00 packs/day  . Smokeless tobacco: None  . Alcohol Use: Yes     Comment: social    Review of Systems  Constitutional: Positive for fever.  HENT: Positive for congestion. Negative for drooling, ear pain, facial swelling, rhinorrhea, sneezing and sore throat.   Respiratory: Positive for cough. Negative for shortness of breath.   Cardiovascular: Negative for chest pain and leg swelling.  Gastrointestinal: Negative for nausea, vomiting, abdominal pain and diarrhea.  Musculoskeletal: Negative for arthralgias, neck pain and neck stiffness.  Skin: Negative for rash.  Neurological: Negative for headaches.  All other systems reviewed and are negative.  Allergies  Penicillins  Home Medications   Prior to Admission medications   Medication Sig Start Date End Date Taking? Authorizing Provider  HYDROcodone-acetaminophen (NORCO) 5-325 MG per tablet Take 1 tablet by mouth every 6 (six) hours as needed. 03/25/13   Allena Katz, PA-C  ibuprofen (ADVIL,MOTRIN) 600 MG tablet Take 600 mg by mouth every 6 (six) hours as needed.    Historical Provider, MD  traMADol (ULTRAM) 50 MG tablet Take 1 tablet (50 mg total) by mouth every 6 (six) hours as needed. 03/17/13   Tiffany Neva Seat, PA-C   BP 188/136 mmHg  Pulse 114  Temp(Src) 98.6 F (37 C) (  Oral)  Resp 30  SpO2 96% Physical Exam  Constitutional: He is oriented to person, place, and time. He appears well-developed and well-nourished.  HENT:  Head: Normocephalic.  Mouth/Throat: Oropharynx is clear and moist. No oropharyngeal exudate.  Moist mucous membranes No exudate Boggy nasal turbinates Clear colorless post-nasal drip  Eyes: Pupils are equal, round, and reactive to light. Right conjunctiva is injected. Left conjunctiva is injected.  Neck: Normal range of motion.  Neck supple.  Trachea midline  Cardiovascular: Normal rate and regular rhythm.   Pulmonary/Chest: Effort normal and breath sounds normal. No stridor. No respiratory distress. He has no wheezes. He has no rales.  Abdominal: Soft. He exhibits no mass. There is no tenderness. There is no rebound and no guarding.  Musculoskeletal: Normal range of motion.  Lymphadenopathy:    He has no cervical adenopathy.  Neurological: He is alert and oriented to person, place, and time.  Skin: Skin is warm and dry.  Psychiatric: He has a normal mood and affect. His behavior is normal.  Nursing note and vitals reviewed.   ED Course  Procedures (including critical care time) DIAGNOSTIC STUDIES: Oxygen Saturation is 96% on RA,  normal by my interpretation.    COORDINATION OF CARE: 3:12 AM Discussed treatment plan which includes lab work, CXR, Tessalon, and Claritin with pt at bedside and pt agreed to plan.  Labs Review Labs Reviewed  CBC WITH DIFFERENTIAL/PLATELET  I-STAT CHEM 8, ED  I-STAT TROPOININ, ED    Imaging Review No results found. I have personally reviewed and evaluated these images and lab results as part of my medical decision-making.   EKG Interpretation   Date/Time:  Thursday April 16 2015 03:03:42 EST Ventricular Rate:  114 PR Interval:  171 QRS Duration: 85 QT Interval:  351 QTC Calculation: 483 R Axis:   -18 Text Interpretation:  Sinus tachycardia Confirmed by [facility]  MD,  Darleny Sem ([id number]) on 04/16/2015 3:17:40 AM      MDM   Final diagnoses:  None    Stop sudafed as this is increasing your BP.  Will treat symptomatically.  Follow up with your PMD for BP recheck this week.    I personally performed the services described in this documentation, which was scribed in my presence. The recorded information has been reviewed and is accurate.      Cy Blamer, MD 04/16/15 [phone]

## 2015-04-16 NOTE — ED Notes (Signed)
Discharge instructions, follow up care, and rx x4 reviewed with patient. Patient verbalized understanding. Per Nicanor Alcon, MD, patient is to stop taking pseudoephedrine because she believes that is pulling his blood pressure up. Patient verbalized understanding.

## 2015-04-16 NOTE — ED Notes (Addendum)
Patient presents with SOB, fever (102 at home 1000 tylenol 2300 last night) and bilateral neck pain, onset yesterday evening. Denis N/V/D, chest pain, diaphoresis.

## 2015-04-18 LAB — CULTURE, GROUP A STREP (THRC)

## 2015-10-10 ENCOUNTER — Emergency Department (HOSPITAL_COMMUNITY)
Admission: EM | Admit: 2015-10-10 | Discharge: 2015-10-10 | Disposition: A | Discharge status: Home / Self Care | Payer: Self-pay | Attending: Emergency Medicine | Admitting: Emergency Medicine

## 2015-10-10 ENCOUNTER — Encounter (HOSPITAL_COMMUNITY): Payer: Self-pay | Admitting: *Deleted

## 2015-10-10 DIAGNOSIS — J020 Streptococcal pharyngitis: Secondary | ICD-10-CM | POA: Insufficient documentation

## 2015-10-10 DIAGNOSIS — F17200 Nicotine dependence, unspecified, uncomplicated: Secondary | ICD-10-CM | POA: Insufficient documentation

## 2015-10-10 DIAGNOSIS — Z791 Long term (current) use of non-steroidal anti-inflammatories (NSAID): Secondary | ICD-10-CM | POA: Insufficient documentation

## 2015-10-10 DIAGNOSIS — Z79899 Other long term (current) drug therapy: Secondary | ICD-10-CM | POA: Insufficient documentation

## 2015-10-10 LAB — RAPID STREP SCREEN (MED CTR MEBANE ONLY): Streptococcus, Group A Screen (Direct): POSITIVE — AB

## 2015-10-10 MED ORDER — AZITHROMYCIN 500 MG PO TABS: 500.0000 mg | ORAL_TABLET | Freq: Every day | ORAL | 0 refills | Status: DC

## 2015-10-10 NOTE — ED Provider Notes (Signed)
[facility]-EMERGENCY DEPT Provider Note   CSN: [id number] Arrival date & time: 10/10/15  1546  First Provider Contact:  None    By signing my name below, I, Linna Darner, attest that this documentation has been prepared under the direction and in the presence of [employer], PA-C. Electronically Signed: Linna Darner, Scribe. 10/10/2015. 4:35 PM.   History   Chief Complaint Chief Complaint  Patient presents with  . Sore Throat    The history is provided by the patient. No language interpreter was used.     HPI Comments: TAMARIUS EDELSTEIN is a 40 y.o. male who presents to the Emergency Department complaining of sudden onset, constant, right-sided sore throat for the last several days. Pt states that he experienced soreness in the left side of his throat 4 days ago and took various OTC medications with good relief. He reports that he began experiencing right-sided throat soreness and swelling a couple of days ago. He also notes associated right ear pain, nasal congestion, mild cough, postnasal drip, and sinus pain. Pt notes sore throat exacerbation with swallowing. He endorses pain exacerbation with palpation to his right ear. Pt reports his cough has been intermittently dry and productive. He reports he has continued taking the OTC medications with no relief of his current symptoms. He denies fever, headache, ear drainage, SOB, abdominal pain, trouble swallowing, or any other associated symptoms.  Past Medical History:  Diagnosis Date  . Achilles tendon tear    left  . Snores    There are no active problems to display for this patient.   Past Surgical History:  Procedure Laterality Date  . ACHILLES TENDON SURGERY Left 03/25/2013   Procedure: LEFT ACHILLES TENDON REPAIR;  Surgeon: [name], MD;  Location: [facility];  Service: Orthopedics;  Laterality: Left;    Home Medications    Prior to Admission medications   Medication Sig Start Date End Date Taking?  Authorizing Provider  benzonatate (TESSALON) 100 MG capsule Take 1 capsule (100 mg total) by mouth every 8 (eight) hours. 04/16/15   April Palumbo, MD  erythromycin ophthalmic ointment Place a 1/2 inch ribbon of ointment into the lower eyelid bid. 04/16/15   April Palumbo, MD  fluticasone [facility]) 50 MCG/ACT nasal spray Place 2 sprays into both nostrils daily. 04/16/15   April Palumbo, MD  HYDROcodone-acetaminophen (NORCO) 5-325 MG per tablet Take 1 tablet by mouth every 6 (six) hours as needed. Patient not taking: Reported on 04/16/2015 03/25/13   Allena Katz, PA-C  ibuprofen (ADVIL,MOTRIN) 600 MG tablet Take 600 mg by mouth every 6 (six) hours as needed for moderate pain.     Historical Provider, MD  naproxen (NAPROSYN) 375 MG tablet Take 1 tablet (375 mg total) by mouth 2 (two) times daily. 04/16/15   April Palumbo, MD  Pseudoephedrine-APAP-DM (TYLENOL FLU PO) Take 1 capsule by mouth at bedtime.    Historical Provider, MD  traMADol (ULTRAM) 50 MG tablet Take 1 tablet (50 mg total) by mouth every 6 (six) hours as needed. Patient not taking: Reported on 04/16/2015 03/17/13   Marlon Pel, PA-C    Family History No family history on file.  Social History Social History  Substance Use Topics  . Smoking status: Current Every Day Smoker    Packs/day: 1.00  . Smokeless tobacco: Never Used  . Alcohol use Yes     Comment: social     Allergies   Penicillins   Review of Systems Review of Systems  Constitutional:  Negative for fever.  HENT: Positive for congestion, ear pain (right), facial swelling (right side of throat), postnasal drip and sore throat. Negative for ear discharge and trouble swallowing.   Respiratory: Negative for shortness of breath.   Gastrointestinal: Negative for abdominal pain.  Neurological: Negative for headaches.    Physical Exam Updated Vital Signs BP 160/100 (BP Location: Left Arm)   Pulse 102   Temp 98.3 F (36.8 C) (Oral)   Resp 18   SpO2 97%   Physical  Exam  Constitutional: He is oriented to person, place, and time. He appears well-developed and well-nourished. No distress.  HENT:  Head: Normocephalic and atraumatic.  Right Ear: Hearing, external ear and ear canal normal. Tympanic membrane is erythematous.  Left Ear: Hearing, tympanic membrane, external ear and ear canal normal.  Nose: No rhinorrhea.  Mouth/Throat: Uvula is midline and mucous membranes are normal. No uvula swelling. Posterior oropharyngeal erythema present. No oropharyngeal exudate, posterior oropharyngeal edema or tonsillar abscesses.  Eyes: Conjunctivae and EOM are normal. Pupils are equal, round, and reactive to light.  Neck: Neck supple. No tracheal deviation present.  Right sided cervical adenopathy  Cardiovascular: Normal rate and regular rhythm.  Exam reveals no gallop and no friction rub.   No murmur heard. Pulmonary/Chest: Effort normal and breath sounds normal. No respiratory distress. He has no wheezes. He has no rales. He exhibits no tenderness.  Abdominal: Soft. Bowel sounds are normal. He exhibits no distension and no mass. There is no tenderness. There is no rebound and no guarding. No hernia.  Musculoskeletal: Normal range of motion.  Lymphadenopathy:    He has cervical adenopathy.  Neurological: He is alert and oriented to person, place, and time.  Skin: Skin is warm and dry.  Psychiatric: He has a normal mood and affect. His behavior is normal.  Nursing note and vitals reviewed.   ED Treatments / Results  Labs (all labs ordered are listed, but only abnormal results are displayed) Labs Reviewed  RAPID STREP SCREEN (NOT AT [facility]) - Abnormal; Notable for the following:       Result Value   Streptococcus, Group A Screen (Direct) POSITIVE (*)    All other components within normal limits    EKG  EKG Interpretation None       Radiology No results found.  Procedures Procedures (including critical care time)  DIAGNOSTIC STUDIES: Oxygen  Saturation is 97% on RA, normal by my interpretation.    COORDINATION OF CARE: 4:35 PM Discussed treatment plan with pt at bedside and pt agreed to plan.   Medications Ordered in ED Medications - No data to display   Initial Impression / Assessment and Plan / ED Course  I have reviewed the triage vital signs and the nursing notes.  Pertinent labs & imaging results that were available during my care of the patient were reviewed by me and considered in my medical decision making (see chart for details).  Clinical Course  Value Comment By Time  Streptococcus, Group A Screen (Direct): (!) POSITIVE (Reviewed) Linna Darner [birth year] 3910   40 year old male presents with strep throat. Patient has PCN allergy so will rx Azithromycin for 5 days. Continue home symptomatic treatment and plenty of fluids. Work note given. Patient is NAD, non-toxic, with stable VS. Patient is informed of clinical course, understands medical decision making process, and agrees with plan. Opportunity for questions provided and all questions answered. Return precautions given.   I personally performed the services described in this documentation,  which was scribed in my presence. The recorded information has been reviewed and is accurate.   Final Clinical Impressions(s) / ED Diagnoses   Final diagnoses:  Strep throat    New Prescriptions Discharge Medication List as of 10/10/2015  4:58 PM    START taking these medications   Details  azithromycin (ZITHROMAX) 500 MG tablet Take 1 tablet (500 mg total) by mouth daily., Starting Sat 10/10/2015, Print         [name], PA-C 10/10/15 1809    Tilden Fossa, MD 10/11/15 1356

## 2015-10-10 NOTE — ED Triage Notes (Signed)
Pt complains of sore throat, swollen right lymph nodes since Monday. Pt denies fever.

## 2016-05-23 ENCOUNTER — Encounter (HOSPITAL_COMMUNITY): Payer: Self-pay | Admitting: Emergency Medicine

## 2016-05-23 ENCOUNTER — Emergency Department (HOSPITAL_COMMUNITY)
Admission: EM | Admit: 2016-05-23 | Discharge: 2016-05-24 | Disposition: A | Discharge status: Home / Self Care | Payer: Self-pay | Attending: Emergency Medicine | Admitting: Emergency Medicine

## 2016-05-23 DIAGNOSIS — F17200 Nicotine dependence, unspecified, uncomplicated: Secondary | ICD-10-CM | POA: Insufficient documentation

## 2016-05-23 DIAGNOSIS — Z79899 Other long term (current) drug therapy: Secondary | ICD-10-CM | POA: Insufficient documentation

## 2016-05-23 DIAGNOSIS — J029 Acute pharyngitis, unspecified: Secondary | ICD-10-CM | POA: Insufficient documentation

## 2016-05-23 LAB — RAPID STREP SCREEN (MED CTR MEBANE ONLY): Streptococcus, Group A Screen (Direct): NEGATIVE

## 2016-05-23 MED ORDER — DEXAMETHASONE SODIUM PHOSPHATE 10 MG/ML IJ SOLN
10.0000 mg | Freq: Once | INTRAMUSCULAR | Status: AC
  Administered 2016-05-23: 10 mg via INTRAMUSCULAR
  Filled 2016-05-23: qty 1

## 2016-05-23 NOTE — ED Triage Notes (Signed)
Pt is c/o sore throat, pain in his ears for the past 2 days  Pt states it is hurts to swallow and talk  Pt states he has been using theraflu without relief

## 2016-05-23 NOTE — ED Provider Notes (Signed)
[facility]-EMERGENCY DEPT Provider Note   CSN: [id number] Arrival date & time: 05/23/16  2214     History   Chief Complaint Chief Complaint  Patient presents with  . Sore Throat    HPI Joseph Ray is a 41 y.o. male.  The history is provided by the patient. No language interpreter was used.  Sore Throat  This is a new problem. The current episode started yesterday. The problem has been gradually worsening. Pertinent negatives include no chest pain, no abdominal pain, no headaches and no shortness of breath. The symptoms are aggravated by swallowing. He has tried nothing for the symptoms.    Past Medical History:  Diagnosis Date  . Achilles tendon tear    left  . Snores     There are no active problems to display for this patient.   Past Surgical History:  Procedure Laterality Date  . ACHILLES TENDON SURGERY Left 03/25/2013   Procedure: LEFT ACHILLES TENDON REPAIR;  Surgeon: [name], MD;  Location: [facility];  Service: Orthopedics;  Laterality: Left;       Home Medications    Prior to Admission medications   Medication Sig Start Date End Date Taking? Authorizing Provider  azithromycin (ZITHROMAX) 500 MG tablet Take 1 tablet (500 mg total) by mouth daily. 10/10/15   [name], PA-C  benzonatate (TESSALON) 100 MG capsule Take 1 capsule (100 mg total) by mouth every 8 (eight) hours. 04/16/15   April Palumbo, MD  erythromycin ophthalmic ointment Place a 1/2 inch ribbon of ointment into the lower eyelid bid. 04/16/15   April Palumbo, MD  fluticasone [facility]) 50 MCG/ACT nasal spray Place 2 sprays into both nostrils daily. 04/16/15   April Palumbo, MD  HYDROcodone-acetaminophen (NORCO) 5-325 MG per tablet Take 1 tablet by mouth every 6 (six) hours as needed. Patient not taking: Reported on 04/16/2015 03/25/13   Allena Katz, PA-C  ibuprofen (ADVIL,MOTRIN) 600 MG tablet Take 600 mg by mouth every 6 (six) hours as needed for moderate pain.     Historical  Provider, MD  naproxen (NAPROSYN) 375 MG tablet Take 1 tablet (375 mg total) by mouth 2 (two) times daily. 04/16/15   April Palumbo, MD  Pseudoephedrine-APAP-DM (TYLENOL FLU PO) Take 1 capsule by mouth at bedtime.    Historical Provider, MD  traMADol (ULTRAM) 50 MG tablet Take 1 tablet (50 mg total) by mouth every 6 (six) hours as needed. Patient not taking: Reported on 04/16/2015 03/17/13   Marlon Pel, PA-C    Family History Family History  Problem Relation Age of Onset  . Hypertension Mother   . Hypertension Father     Social History Social History  Substance Use Topics  . Smoking status: Current Some Day Smoker    Packs/day: 1.00  . Smokeless tobacco: Never Used  . Alcohol use Yes     Comment: social     Allergies   Penicillins   Review of Systems Review of Systems  Constitutional: Positive for fever.  Respiratory: Negative for shortness of breath.   Cardiovascular: Negative for chest pain.  Gastrointestinal: Negative for abdominal pain, nausea and vomiting.  Musculoskeletal: Negative for arthralgias and myalgias.  Skin: Negative for rash.  Neurological: Negative for headaches.  All other systems reviewed and are negative.    Physical Exam Updated Vital Signs BP (!) 193/149 (BP Location: Right Arm)   Pulse 88   Temp 98.1 F (36.7 C) (Oral)   Resp 18   Ht 6\' 2"  (  1.88 m)   Wt 122.5 kg   SpO2 94%   BMI 34.67 kg/m   Physical Exam  Constitutional: He is oriented to person, place, and time. He appears well-developed and well-nourished.  HENT:  Head: Normocephalic.  Mouth/Throat: Uvula is midline. No trismus in the jaw. No uvula swelling. Posterior oropharyngeal erythema present. No oropharyngeal exudate or tonsillar abscesses. No tonsillar exudate.  Eyes: Conjunctivae are normal.  Neck: Neck supple.  Cardiovascular: Normal rate and regular rhythm.   Pulmonary/Chest: Effort normal and breath sounds normal.  Abdominal: Soft. Bowel sounds are normal.    Musculoskeletal: He exhibits no edema or tenderness.  Lymphadenopathy:    He has no cervical adenopathy.  Neurological: He is alert and oriented to person, place, and time.  Skin: Skin is warm and dry. No rash noted.  Psychiatric: He has a normal mood and affect.  Nursing note and vitals reviewed.    ED Treatments / Results  Labs (all labs ordered are listed, but only abnormal results are displayed) Labs Reviewed  RAPID STREP SCREEN (NOT AT [facility])  CULTURE, GROUP A STREP [facility])    EKG  EKG Interpretation None       Radiology No results found.  Procedures Procedures (including critical care time)  Medications Ordered in ED Medications  dexamethasone (DECADRON) injection 10 mg (10 mg Intramuscular Given 05/23/16 2345)     Initial Impression / Assessment and Plan / ED Course  I have reviewed the triage vital signs and the nursing notes.  Pertinent labs & imaging results that were available during my care of the patient were reviewed by me and considered in my medical decision making (see chart for details).     Pt with negative strep. Diagnosis of viral pharyngitis. No abx indicated at this time. Discussed that results of strep culture are pending and patient will be informed if positive result and abx will be called in at that time. Discharge with symptomatic tx. No evidence of dehydration. Pt is tolerating secretions. Presentation not concerning for peritonsillar abscess or spread of infection to deep spaces of the throat; patent airway. Specific return precautions discussed. Recommended PCP follow up. Pt appears safe for discharge.  Patient noted to be hypertensive in the emergency department.  No signs of hypertensive urgency.  Discussed with patient the need for close follow-up and management by their primary care physician.   Final Clinical Impressions(s) / ED Diagnoses   Final diagnoses:  Sore throat    New Prescriptions New Prescriptions   No medications  on file     Felicie Morn, NP 05/24/16 1610    Loren Racer, MD 05/24/16 [phone]

## 2016-05-26 LAB — CULTURE, GROUP A STREP (THRC)

## 2017-03-16 ENCOUNTER — Emergency Department (HOSPITAL_COMMUNITY)
Admission: EM | Admit: 2017-03-16 | Discharge: 2017-03-16 | Disposition: A | Discharge status: Home / Self Care | Payer: No Typology Code available for payment source | Attending: Emergency Medicine | Admitting: Emergency Medicine

## 2017-03-16 ENCOUNTER — Encounter (HOSPITAL_COMMUNITY): Payer: Self-pay | Admitting: Emergency Medicine

## 2017-03-16 ENCOUNTER — Emergency Department (HOSPITAL_COMMUNITY): Payer: No Typology Code available for payment source

## 2017-03-16 DIAGNOSIS — Y9389 Activity, other specified: Secondary | ICD-10-CM | POA: Diagnosis not present

## 2017-03-16 DIAGNOSIS — Y92410 Unspecified street and highway as the place of occurrence of the external cause: Secondary | ICD-10-CM | POA: Diagnosis not present

## 2017-03-16 DIAGNOSIS — S3992XA Unspecified injury of lower back, initial encounter: Secondary | ICD-10-CM | POA: Diagnosis present

## 2017-03-16 DIAGNOSIS — R030 Elevated blood-pressure reading, without diagnosis of hypertension: Secondary | ICD-10-CM | POA: Diagnosis not present

## 2017-03-16 DIAGNOSIS — S39012A Strain of muscle, fascia and tendon of lower back, initial encounter: Secondary | ICD-10-CM | POA: Insufficient documentation

## 2017-03-16 DIAGNOSIS — Y999 Unspecified external cause status: Secondary | ICD-10-CM | POA: Diagnosis not present

## 2017-03-16 DIAGNOSIS — S161XXA Strain of muscle, fascia and tendon at neck level, initial encounter: Secondary | ICD-10-CM | POA: Insufficient documentation

## 2017-03-16 DIAGNOSIS — Z79899 Other long term (current) drug therapy: Secondary | ICD-10-CM | POA: Diagnosis not present

## 2017-03-16 MED ORDER — NAPROXEN 500 MG PO TABS: 500.0000 mg | ORAL_TABLET | Freq: Two times a day (BID) | ORAL | 0 refills | Status: DC

## 2017-03-16 MED ORDER — METHOCARBAMOL 500 MG PO TABS: 500.0000 mg | ORAL_TABLET | Freq: Two times a day (BID) | ORAL | 0 refills | Status: DC

## 2017-03-16 MED ORDER — KETOROLAC TROMETHAMINE 30 MG/ML IJ SOLN
30.0000 mg | Freq: Once | INTRAMUSCULAR | Status: AC
  Administered 2017-03-16: 30 mg via INTRAMUSCULAR
  Filled 2017-03-16: qty 1
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
## 2017-03-16 NOTE — ED Notes (Signed)
Bed: WTR6 Expected date:  Expected time:  Means of arrival:  Comments: 

## 2017-03-16 NOTE — Discharge Instructions (Signed)
Naprosyn for pain. Robaxin for spasms. Try stretches. Heating pads. Follow up with family doctor as needed if not improving.

## 2017-03-16 NOTE — ED Notes (Signed)
Patient transported to X-ray 

## 2019-01-15 ENCOUNTER — Other Ambulatory Visit: Payer: Self-pay

## 2019-01-15 ENCOUNTER — Ambulatory Visit
Admission: EM | Admit: 2019-01-15 | Discharge: 2019-01-15 | Disposition: A | Discharge status: Home / Self Care | Payer: PRIVATE HEALTH INSURANCE | Attending: Emergency Medicine | Admitting: Emergency Medicine

## 2019-01-15 ENCOUNTER — Encounter: Payer: Self-pay | Admitting: Emergency Medicine

## 2019-01-15 DIAGNOSIS — R030 Elevated blood-pressure reading, without diagnosis of hypertension: Secondary | ICD-10-CM | POA: Diagnosis not present

## 2019-01-15 DIAGNOSIS — L03311 Cellulitis of abdominal wall: Secondary | ICD-10-CM | POA: Diagnosis not present

## 2019-01-15 MED ORDER — DOXYCYCLINE HYCLATE 100 MG PO CAPS: 100.0000 mg | ORAL_CAPSULE | Freq: Two times a day (BID) | ORAL | 0 refills | Status: DC

## 2019-01-15 NOTE — Discharge Instructions (Signed)
Keep area(s) clean and dry. Apply hot compress / towel for 5-10 minutes 3-5 times daily. Take antibiotic as prescribed with food - important to complete course. Go to ER for worsening pain, redness, swelling, discharge, fever.  Helpful prevention tips: Keep nails short to avoid secondary skin infections.

## 2019-01-15 NOTE — ED Provider Notes (Signed)
[facility]    CSN: [id number] Arrival date & time: 01/15/19  1628      History   Chief Complaint Chief Complaint  Patient presents with  . Insect Bite    HPI Joseph Ray is a 43 y.o. male presenting for redness, swelling over area of bug bite that occurred on upper abdomen 1 week ago.  Patient states that he noticed the redness started to spread up towards his chest 2 days ago.  Patient started using hot compresses today with mild relief.  No open wound, discharge.  Patient unsure of what bit him.  Denies fever, arthralgias, myalgias, other rashes, chest pain, shortness of breath.  Patient is hypertensive in office: Chart review done by me showing numerous elevated blood pressures in the past.  Patient denies antihypertensive medication use, history of MI/stroke.  Denies history of diabetes, immunocompromise status.   Past Medical History:  Diagnosis Date  . Achilles tendon tear    left  . Snores     There are no active problems to display for this patient.   Past Surgical History:  Procedure Laterality Date  . ACHILLES TENDON SURGERY Left 03/25/2013   Procedure: LEFT ACHILLES TENDON REPAIR;  Surgeon: Kerin Salen, MD;  Location: [facility];  Service: Orthopedics;  Laterality: Left;       Home Medications    Prior to Admission medications   Medication Sig Start Date End Date Taking? Authorizing Provider  doxycycline (VIBRAMYCIN) 100 MG capsule Take 1 capsule (100 mg total) by mouth 2 (two) times daily. 01/15/19   Hall-Potvin, [place], PA-C  fluticasone (FLONASE) 50 MCG/ACT nasal spray Place 2 sprays into both nostrils daily. 04/16/15   Palumbo, April, MD  ibuprofen (ADVIL,MOTRIN) 600 MG tablet Take 600 mg by mouth every 6 (six) hours as needed for moderate pain.     [provider]    Family History Family History  Problem Relation Age of Onset  . Hypertension Mother   . Hypertension Father     Social History Social  History   Tobacco Use  . Smoking status: Current Some Day Smoker    Packs/day: 0.15  . Smokeless tobacco: Never Used  Substance Use Topics  . Alcohol use: Yes    Comment: social  . Drug use: No     Allergies   Penicillins   Review of Systems Review of Systems  Constitutional: Negative for fatigue and fever.  Respiratory: Negative for cough and shortness of breath.   Cardiovascular: Negative for chest pain and palpitations.  Gastrointestinal: Negative for abdominal pain, diarrhea and vomiting.  Musculoskeletal: Negative for arthralgias and myalgias.  Skin: Positive for rash. Negative for wound.  Neurological: Negative for speech difficulty and headaches.  All other systems reviewed and are negative.    Physical Exam Triage Vital Signs ED Triage Vitals  Enc Vitals Group     BP      Pulse      Resp      Temp      Temp src      SpO2      Weight      Height      Head Circumference      Peak Flow      Pain Score      Pain Loc      Pain Edu?      Excl. in [place]?    No data found.  Updated Vital Signs BP (!) 184/124 (BP Location: Left Arm)  Comment: retake  Pulse (!) 104   Temp 98.2 F (36.8 C) (Temporal)   Resp (!) 22   SpO2 96%   Visual Acuity Right Eye Distance:   Left Eye Distance:   Bilateral Distance:    Right Eye Near:   Left Eye Near:    Bilateral Near:     Physical Exam Constitutional:      General: He is not in acute distress. HENT:     Head: Normocephalic and atraumatic.  Eyes:     General: No scleral icterus.    Pupils: Pupils are equal, round, and reactive to light.  Cardiovascular:     Rate and Rhythm: Normal rate and regular rhythm.     Heart sounds: No murmur. No gallop.      Comments: HR 97-108 bpm w/ provider Pulmonary:     Effort: Pulmonary effort is normal. No respiratory distress.     Breath sounds: No wheezing.     Comments: Respiratory rate under 20 per provider Abdominal:     General: Bowel sounds are normal.      Tenderness: There is no abdominal tenderness.    Neurological:     Mental Status: He is alert and oriented to person, place, and time.      UC Treatments / Results  Labs (all labs ordered are listed, but only abnormal results are displayed) Labs Reviewed - No data to display  EKG   Radiology No results found.  Procedures Procedures (including critical care time)  Medications Ordered in UC Medications - No data to display  Initial Impression / Assessment and Plan / UC Course  I have reviewed the triage vital signs and the nursing notes.  Pertinent labs & imaging results that were available during my care of the patient were reviewed by me and considered in my medical decision making (see chart for details).     Lesion outlined with skin marker in office.  We will treat for cellulitis status post unknown bug bite with doxycycline as outlined below.  Low concern for tickborne illness given lack of systemic symptoms, fever.  Patient hypertensive in office: Denies symptoms, has had similar readings in the past.  Patient to follow-up with PCP for evaluation/management.  Return precautions discussed, patient verbalized understanding and is agreeable to plan. Final Clinical Impressions(s) / UC Diagnoses   Final diagnoses:  Cellulitis of abdominal wall     Discharge Instructions     Keep area(s) clean and dry. Apply hot compress / towel for 5-10 minutes 3-5 times daily. Take antibiotic as prescribed with food - important to complete course. Go to ER for worsening pain, redness, swelling, discharge, fever.  Helpful prevention tips: Keep nails short to avoid secondary skin infections.    ED Prescriptions    Medication Sig Dispense Auth. Provider   doxycycline (VIBRAMYCIN) 100 MG capsule Take 1 capsule (100 mg total) by mouth 2 (two) times daily. 20 capsule Hall-Potvin, [place], PA-C     PDMP not reviewed this encounter.   Hall-Potvin, [place], [place] 01/15/19 1716

## 2019-01-15 NOTE — ED Triage Notes (Signed)
Pt presents to [facility] for assessment of bug bite to upper abdomen 1 week ago, with spreading redness x 2 days.  Denies fevers, chills, n/v.

## 2019-01-15 NOTE — ED Notes (Signed)
Patient able to ambulate independently  

## 2019-11-29 ENCOUNTER — Ambulatory Visit
Admission: EM | Admit: 2019-11-29 | Discharge: 2019-11-29 | Disposition: A | Discharge status: Home / Self Care | Payer: PRIVATE HEALTH INSURANCE | Attending: Family | Admitting: Family

## 2019-11-29 DIAGNOSIS — R030 Elevated blood-pressure reading, without diagnosis of hypertension: Secondary | ICD-10-CM | POA: Diagnosis not present

## 2019-11-29 DIAGNOSIS — H6983 Other specified disorders of Eustachian tube, bilateral: Secondary | ICD-10-CM | POA: Diagnosis not present

## 2019-11-29 DIAGNOSIS — H938X3 Other specified disorders of ear, bilateral: Secondary | ICD-10-CM

## 2019-11-29 DIAGNOSIS — H6993 Unspecified Eustachian tube disorder, bilateral: Secondary | ICD-10-CM

## 2019-11-29 MED ORDER — FLUTICASONE PROPIONATE 50 MCG/ACT NA SUSP: 1.0000 | Freq: Two times a day (BID) | NASAL | 0 refills | Status: DC

## 2019-11-29 NOTE — ED Provider Notes (Signed)
[facility]    CSN: [id number] Arrival date & time: 11/29/19  1009      History   Chief Complaint Chief Complaint  Patient presents with  . Otalgia    HPI Joseph Ray is a 44 y.o. male.   44 year old male presents with bilateral ear pressure for the past week. Ears feel "full" but no pain. Denies any fever, nasal congestion, sore throat or drainage from his ears. Has not tried any medication for symptoms. Did fly recently and symptoms started about 2 days after landing. Other chronic health issues include occasional elevated blood pressure- is being worked up for possible causes including sleep apnea. Has appointment in about 6 weeks for evaluation. Denies any chest pain, palpitations, difficulty breathing, dizziness or GI symptoms. Did try Zyrtec-D once for symptoms with minimal relief. No other daily medications.   The history is provided by the patient.    Past Medical History:  Diagnosis Date  . Achilles tendon tear    left  . Snores     There are no problems to display for this patient.   Past Surgical History:  Procedure Laterality Date  . ACHILLES TENDON SURGERY Left 03/25/2013   Procedure: LEFT ACHILLES TENDON REPAIR;  Surgeon: [name], MD;  Location: [facility];  Service: Orthopedics;  Laterality: Left;       Home Medications    Prior to Admission medications   Medication Sig Start Date End Date Taking? Authorizing Provider  fluticasone (FLONASE) 50 MCG/ACT nasal spray Place 1 spray into both nostrils in the morning and at bedtime. 11/29/19   Sudie Grumbling, NP    Family History Family History  Problem Relation Age of Onset  . Hypertension Mother   . Hypertension Father     Social History Social History   Tobacco Use  . Smoking status: Current Some Day Smoker    Packs/day: 0.15  . Smokeless tobacco: Never Used  Vaping Use  . Vaping Use: Never used  Substance Use Topics  . Alcohol use: Yes    Comment:  social  . Drug use: No     Allergies   Penicillins   Review of Systems Review of Systems  Constitutional: Negative for activity change, appetite change, chills, diaphoresis, fatigue and fever.  HENT: Negative for congestion, ear discharge, ear pain, nosebleeds, postnasal drip, rhinorrhea, sinus pressure, sore throat, tinnitus and trouble swallowing. Hearing loss: ears feel full/pressure.   Eyes: Negative for photophobia and visual disturbance.  Respiratory: Negative for cough, chest tightness and shortness of breath.   Cardiovascular: Negative for chest pain and palpitations.  Gastrointestinal: Negative for nausea and vomiting.  Skin: Negative for color change and rash.  Allergic/Immunologic: Positive for environmental allergies. Negative for food allergies and immunocompromised state.  Neurological: Negative for dizziness, tremors, seizures, syncope, facial asymmetry, speech difficulty, weakness, light-headedness, numbness and headaches.  Hematological: Negative for adenopathy. Does not bruise/bleed easily.  Psychiatric/Behavioral: Positive for sleep disturbance.     Physical Exam Triage Vital Signs ED Triage Vitals [11/29/19 1034]  Enc Vitals Group     BP (!) 178/132     Pulse Rate (!) 105     Resp (!) 22     Temp 98.5 F (36.9 C)     Temp Source Oral     SpO2 95 %     Weight      Height      Head Circumference      Peak Flow  Pain Score 0     Pain Loc      Pain Edu?      Excl. in [place]?    No data found.  Updated Vital Signs BP (!) 178/132 (BP Location: Left Arm)   Pulse (!) 105   Temp 98.5 F (36.9 C) (Oral)   Resp (!) 22   SpO2 95%   Visual Acuity Right Eye Distance:   Left Eye Distance:   Bilateral Distance:    Right Eye Near:   Left Eye Near:    Bilateral Near:     Physical Exam Vitals and nursing note reviewed.  Constitutional:      General: He is awake. He is not in acute distress.    Appearance: He is well-developed, well-groomed and  overweight. He is not ill-appearing.     Comments: He is sitting comfortably on the exam table in no acute distress.   HENT:     Head: Normocephalic and atraumatic.     Right Ear: Hearing, ear canal and external ear normal. No drainage, swelling or tenderness. There is no impacted cerumen. Tympanic membrane is bulging. Tympanic membrane is not injected or erythematous.     Left Ear: Hearing, ear canal and external ear normal. No drainage, swelling or tenderness. There is no impacted cerumen. Tympanic membrane is bulging. Tympanic membrane is not injected or erythematous.     Ears:     Comments: Slight fluid present behind both TM's.     Nose: Nose normal.     Right Sinus: No maxillary sinus tenderness or frontal sinus tenderness.     Left Sinus: No maxillary sinus tenderness or frontal sinus tenderness.     Mouth/Throat:     Lips: Pink.     Mouth: Mucous membranes are moist.     Pharynx: Oropharynx is clear. Uvula midline. No pharyngeal swelling, oropharyngeal exudate, posterior oropharyngeal erythema or uvula swelling.  Eyes:     Extraocular Movements: Extraocular movements intact.     Conjunctiva/sclera: Conjunctivae normal.  Neck:     Vascular: No carotid bruit.  Cardiovascular:     Rate and Rhythm: Normal rate and regular rhythm.     Heart sounds: Normal heart sounds. No murmur heard.      Comments: Heart rate is now 94. Pulmonary:     Effort: Pulmonary effort is normal. No tachypnea, prolonged expiration or respiratory distress.     Breath sounds: Normal breath sounds and air entry. No decreased air movement. No decreased breath sounds, wheezing, rhonchi or rales.     Comments: Respiratory rate is now 18.  Musculoskeletal:        General: Normal range of motion.     Cervical back: Normal range of motion and neck supple. No rigidity or tenderness.  Lymphadenopathy:     Cervical: No cervical adenopathy.  Skin:    General: Skin is warm and dry.     Capillary Refill: Capillary  refill takes less than 2 seconds.     Findings: No rash.  Neurological:     General: No focal deficit present.     Mental Status: He is alert and oriented to person, place, and time.     Motor: Motor function is intact.  Psychiatric:        Mood and Affect: Mood normal.        Behavior: Behavior normal. Behavior is cooperative.        Thought Content: Thought content normal.        Judgment: Judgment normal.  UC Treatments / Results  Labs (all labs ordered are listed, but only abnormal results are displayed) Labs Reviewed - No data to display  EKG   Radiology No results found.  Procedures Procedures (including critical care time)  Medications Ordered in UC Medications - No data to display  Initial Impression / Assessment and Plan / UC Course  I have reviewed the triage vital signs and the nursing notes.  Pertinent labs & imaging results that were available during my care of the patient were reviewed by me and considered in my medical decision making (see chart for details).     Discussed that he appears to have bilateral eustachian tube dysfunction. No impacted cerumen. May trial Flonase twice a day as directed. May also try Zyrtec 10mg  once daily- avoid any decongestants or Sudafed which can raise his blood pressure. Discussed that his blood pressure is very elevated but patient is asymptomatic. Recommend continue to monitor. If any chest pain, difficulty breathing, or palpitations occur, go to the ER ASAP. Otherwise, follow-up with your provider for BP management as scheduled. May need ENT referral if ear symptoms do not improve. Follow-up in 4 to 5 days if not improving.  Final Clinical Impressions(s) / UC Diagnoses   Final diagnoses:  Eustachian tube dysfunction, bilateral  Sensation of fullness in both ears  Elevated blood-pressure reading without diagnosis of hypertension     Discharge Instructions     Recommend restart Flonase 1 spray in each nostril  twice a day. May restart OTC Zyrtec 10mg  daily. Avoid decongestants and combination Zyrtec-D. Continue to monitor symptoms. May need to see ENT if symptoms do not resolve. Also continue to monitor blood pressure. Follow-up in 4 to 5 days if not improving.     ED Prescriptions    Medication Sig Dispense Auth. Provider   fluticasone (FLONASE) 50 MCG/ACT nasal spray Place 1 spray into both nostrils in the morning and at bedtime. 16 g , NP     PDMP not reviewed this encounter.   , NP 11/29/19 2156

## 2019-11-29 NOTE — ED Triage Notes (Signed)
Pt present pressure in both ears, he states it not painful but his ears feel full. Symptoms started over a week ago.

## 2019-11-29 NOTE — Discharge Instructions (Addendum)
Recommend restart Flonase 1 spray in each nostril twice a day. May restart OTC Zyrtec 10mg  daily. Avoid decongestants and combination Zyrtec-D. Continue to monitor symptoms. May need to see ENT if symptoms do not resolve. Also continue to monitor blood pressure. Follow-up in 4 to 5 days if not improving.

## 2020-03-11 ENCOUNTER — Inpatient Hospital Stay (HOSPITAL_COMMUNITY)
Admission: EM | Admit: 2020-03-11 | Discharge: 2020-03-13 | DRG: 291 | Disposition: A | Discharge status: Home / Self Care | Payer: HRSA Program | Attending: Internal Medicine | Admitting: Internal Medicine

## 2020-03-11 ENCOUNTER — Emergency Department (HOSPITAL_COMMUNITY): Payer: PRIVATE HEALTH INSURANCE

## 2020-03-11 ENCOUNTER — Encounter (HOSPITAL_COMMUNITY): Payer: Self-pay | Admitting: Student

## 2020-03-11 ENCOUNTER — Other Ambulatory Visit: Payer: Self-pay

## 2020-03-11 DIAGNOSIS — Z880 Allergy status to penicillin: Secondary | ICD-10-CM

## 2020-03-11 DIAGNOSIS — F17210 Nicotine dependence, cigarettes, uncomplicated: Secondary | ICD-10-CM | POA: Diagnosis present

## 2020-03-11 DIAGNOSIS — Z8249 Family history of ischemic heart disease and other diseases of the circulatory system: Secondary | ICD-10-CM

## 2020-03-11 DIAGNOSIS — I77810 Thoracic aortic ectasia: Secondary | ICD-10-CM | POA: Diagnosis present

## 2020-03-11 DIAGNOSIS — R0683 Snoring: Secondary | ICD-10-CM | POA: Diagnosis present

## 2020-03-11 DIAGNOSIS — F1290 Cannabis use, unspecified, uncomplicated: Secondary | ICD-10-CM | POA: Diagnosis present

## 2020-03-11 DIAGNOSIS — R030 Elevated blood-pressure reading, without diagnosis of hypertension: Secondary | ICD-10-CM | POA: Diagnosis present

## 2020-03-11 DIAGNOSIS — I509 Heart failure, unspecified: Secondary | ICD-10-CM

## 2020-03-11 DIAGNOSIS — I10 Essential (primary) hypertension: Secondary | ICD-10-CM | POA: Diagnosis present

## 2020-03-11 DIAGNOSIS — U071 COVID-19: Secondary | ICD-10-CM

## 2020-03-11 DIAGNOSIS — Z79899 Other long term (current) drug therapy: Secondary | ICD-10-CM

## 2020-03-11 DIAGNOSIS — I152 Hypertension secondary to endocrine disorders: Secondary | ICD-10-CM | POA: Diagnosis present

## 2020-03-11 DIAGNOSIS — I110 Hypertensive heart disease with heart failure: Principal | ICD-10-CM | POA: Diagnosis present

## 2020-03-11 DIAGNOSIS — I5021 Acute systolic (congestive) heart failure: Secondary | ICD-10-CM | POA: Diagnosis present

## 2020-03-11 DIAGNOSIS — Z6836 Body mass index (BMI) 36.0-36.9, adult: Secondary | ICD-10-CM

## 2020-03-11 DIAGNOSIS — I700 Atherosclerosis of aorta: Secondary | ICD-10-CM | POA: Diagnosis present

## 2020-03-11 DIAGNOSIS — E669 Obesity, unspecified: Secondary | ICD-10-CM | POA: Diagnosis present

## 2020-03-11 DIAGNOSIS — I429 Cardiomyopathy, unspecified: Secondary | ICD-10-CM | POA: Diagnosis present

## 2020-03-11 DIAGNOSIS — I5040 Unspecified combined systolic (congestive) and diastolic (congestive) heart failure: Secondary | ICD-10-CM

## 2020-03-11 HISTORY — DX: COVID-19: U07.1

## 2020-03-11 LAB — CBC
HCT: 42.7 % (ref 39.0–52.0)
Hemoglobin: 13.9 g/dL (ref 13.0–17.0)
MCH: 30.7 pg (ref 26.0–34.0)
MCHC: 32.6 g/dL (ref 30.0–36.0)
MCV: 94.3 fL (ref 80.0–100.0)
Platelets: 223 10*3/uL (ref 150–400)
RBC: 4.53 MIL/uL (ref 4.22–5.81)
RDW: 14.3 % (ref 11.5–15.5)
WBC: 6 10*3/uL (ref 4.0–10.5)
nRBC: 0 % (ref 0.0–0.2)

## 2020-03-11 LAB — BASIC METABOLIC PANEL
Anion gap: 8 (ref 5–15)
BUN: 11 mg/dL (ref 6–20)
CO2: 26 mmol/L (ref 22–32)
Calcium: 9.2 mg/dL (ref 8.9–10.3)
Chloride: 106 mmol/L (ref 98–111)
Creatinine, Ser: 1.24 mg/dL (ref 0.61–1.24)
GFR, Estimated: >60 mL/min (ref >60)
Glucose, Bld: 101 mg/dL — ABNORMAL HIGH (ref 70–99)
Potassium: 4.2 mmol/L (ref 3.5–5.1)
Sodium: 140 mmol/L (ref 135–145)

## 2020-03-11 LAB — RESP PANEL BY RT-PCR (FLU A&B, COVID) ARPGX2
Influenza A by PCR: NEGATIVE
Influenza B by PCR: NEGATIVE
SARS Coronavirus 2 by RT PCR: POSITIVE — AB

## 2020-03-11 LAB — TROPONIN I (HIGH SENSITIVITY)
Troponin I (High Sensitivity): 84 ng/L — ABNORMAL HIGH (ref <18)
Troponin I (High Sensitivity): 65 ng/L — ABNORMAL HIGH (ref <18)

## 2020-03-11 LAB — POC SARS CORONAVIRUS 2 AG -  ED: SARS Coronavirus 2 Ag: NEGATIVE

## 2020-03-11 LAB — BRAIN NATRIURETIC PEPTIDE: B Natriuretic Peptide: 291.4 pg/mL — ABNORMAL HIGH (ref 0.0–100.0)

## 2020-03-11 MED ORDER — ONDANSETRON HCL 4 MG/2ML IJ SOLN: 4.0000 mg | Freq: Four times a day (QID) | INTRAMUSCULAR | Status: DC | PRN

## 2020-03-11 MED ORDER — FUROSEMIDE 10 MG/ML IJ SOLN
40.0000 mg | Freq: Two times a day (BID) | INTRAMUSCULAR | Status: DC
  Administered 2020-03-12: 05:00:00 40 mg via INTRAVENOUS
  Filled 2020-03-11: qty 4

## 2020-03-11 MED ORDER — SODIUM CHLORIDE 0.9 % IV SOLN
250.0000 mL | INTRAVENOUS | Status: DC | PRN
Start: 2020-03-11 — End: 2020-03-13

## 2020-03-11 MED ORDER — ZINC SULFATE 220 (50 ZN) MG PO CAPS
220.0000 mg | ORAL_CAPSULE | Freq: Every day | ORAL | Status: DC
  Administered 2020-03-11 – 2020-03-13 (×3): 220 mg via ORAL
  Filled 2020-03-11 (×3): qty 1

## 2020-03-11 MED ORDER — SODIUM CHLORIDE 0.9% FLUSH
3.0000 mL | Freq: Two times a day (BID) | INTRAVENOUS | Status: DC
  Administered 2020-03-11 – 2020-03-13 (×4): 3 mL via INTRAVENOUS

## 2020-03-11 MED ORDER — POTASSIUM CHLORIDE CRYS ER 20 MEQ PO TBCR
20.0000 meq | EXTENDED_RELEASE_TABLET | Freq: Once | ORAL | Status: AC
  Administered 2020-03-11: 19:00:00 20 meq via ORAL
  Filled 2020-03-11: qty 1

## 2020-03-11 MED ORDER — GUAIFENESIN-DM 100-10 MG/5ML PO SYRP: 10.0000 mL | ORAL_SOLUTION | ORAL | Status: DC | PRN

## 2020-03-11 MED ORDER — LOSARTAN POTASSIUM 25 MG PO TABS
25.0000 mg | ORAL_TABLET | Freq: Every day | ORAL | Status: DC
  Administered 2020-03-11 – 2020-03-12 (×2): 25 mg via ORAL
  Filled 2020-03-11 (×2): qty 1

## 2020-03-11 MED ORDER — SODIUM CHLORIDE 0.9% FLUSH: 3.0000 mL | INTRAVENOUS | Status: DC | PRN

## 2020-03-11 MED ORDER — ASCORBIC ACID 500 MG PO TABS
500.0000 mg | ORAL_TABLET | Freq: Every day | ORAL | Status: DC
  Administered 2020-03-11 – 2020-03-13 (×3): 500 mg via ORAL
  Filled 2020-03-11 (×3): qty 1

## 2020-03-11 MED ORDER — IOHEXOL 350 MG/ML SOLN
100.0000 mL | Freq: Once | INTRAVENOUS | Status: AC | PRN
  Administered 2020-03-11: 100 mL via INTRAVENOUS

## 2020-03-11 MED ORDER — FUROSEMIDE 10 MG/ML IJ SOLN
40.0000 mg | Freq: Once | INTRAMUSCULAR | Status: AC
  Administered 2020-03-11: 19:00:00 40 mg via INTRAVENOUS
  Filled 2020-03-11: qty 4

## 2020-03-11 MED ORDER — ENOXAPARIN SODIUM 40 MG/0.4ML ~~LOC~~ SOLN
40.0000 mg | SUBCUTANEOUS | Status: DC
  Administered 2020-03-11 – 2020-03-12 (×2): 40 mg via SUBCUTANEOUS
  Filled 2020-03-11 (×2): qty 0.4

## 2020-03-11 MED ORDER — ACETAMINOPHEN 325 MG PO TABS: 650.0000 mg | ORAL_TABLET | ORAL | Status: DC | PRN

## 2020-03-11 NOTE — ED Notes (Signed)
Pt stated that he has history of high BP but is not currently on medication for it, RN has been made aware of elevated BP readings as well as pt hx.

## 2020-03-11 NOTE — H&P (Addendum)
History and Physical    Joseph Ray KGU:[id number] DOB: [birth year] DOA: 03/11/2020  PCP: Patient, No Pcp Per  Patient coming from: Home  I have personally briefly reviewed patient's old medical records in [facility]  Chief Complaint: Shortness of breath  HPI: Joseph Ray is a 44 y.o. male with medical history significant for hypertension, obesity who presents to the ED for evaluation of shortness of breath.  Patient states 3-4 years ago he developed cough productive of clear/white sputum.  He felt as if he was coming down with an upper respiratory infection therefore was try to support himself with TheraFlu, fluids, and soup.  Over the last 2 days he developed new onset of shortness of breath, worse when lying flat.  He has had associated paroxysmal nocturnal dyspnea.  He has occasional sensation of palpitations.  He denies any significant dyspnea on exertion.  He says he had seen swelling around his ankle several months ago which resolved on its own and has not seen any recent lower extremity edema.  He says he does snore at night and is planning on undergoing a sleep study at some point.  He does report tobacco use, 1 pack lasting up to 1 week.  He reports occasional alcohol use in a social setting.  He admits to marijuana use.  He denies any cocaine or IV drug use.  He reports a history of high blood pressure in both of his parents.  ED Course:  Initial vitals showed BP 173/134, pulse 101, RR 18, temp 99.5 F, SPO2 97% on room air.  Labs show sodium 140, potassium 4.2, bicarb 26, BUN 11, creatinine 1.24, serum glucose 101, WBC 6.0, hemoglobin 13.9, platelets 223,000, BNP 291.4, high-sensitivity troponin I 84 > 65.  SARS-CoV-2 antigen is negative.  SARS-CoV-2 PCR panel is ordered and pending.  Two view chest x-ray shows diffuse interstitial prominence with patchy perihilar/basilar opacities.  CTA chest PE study is negative for PE.  Cardiomegaly and changes suggestive of  generalized pulmonary edema are seen.  Trace right pleural effusion noted.  A fusiform dilatation of the ascending aorta at 4.3 cm also identified.  Patient was given IV Lasix 40 mg and oral K 20 mEq once.  The hospitalist service was consulted to admit for further evaluation and management.  Review of Systems: All systems reviewed and are negative except as documented in history of present illness above.   Past Medical History:  Diagnosis Date   Achilles tendon tear    left   Snores     Past Surgical History:  Procedure Laterality Date   ACHILLES TENDON SURGERY Left 03/25/2013   Procedure: LEFT ACHILLES TENDON REPAIR;  Surgeon: [name], MD;  Location: [facility];  Service: Orthopedics;  Laterality: Left;    Social History:  reports that he has been smoking. He has been smoking about 0.15 packs per day. He has never used smokeless tobacco. He reports current alcohol use. He reports that he does not use drugs.  Allergies  Allergen Reactions   Penicillins Rash        Family History  Problem Relation Age of Onset   Hypertension Mother    Hypertension Father      Prior to Admission medications   Medication Sig Start Date End Date Taking? Authorizing Provider  guaifenesin (ROBITUSSIN) 100 MG/5ML syrup Take 200 mg by mouth 2 (two) times daily as needed for cough.   Yes [provider]  Pheniramine-PE-APAP (THERAFLU FLU & SORE  THROAT) 20-10-650 MG PACK Take 1 packet by mouth daily as needed (cold & flu).   Yes [provider]  fluticasone (FLONASE) 50 MCG/ACT nasal spray Place 1 spray into both nostrils in the morning and at bedtime. Patient not taking: No sig reported 11/29/19   Sudie Grumbling, NP    Physical Exam: Vitals:   03/11/20 1615 03/11/20 1630 03/11/20 1752 03/11/20 2100  BP: (!) 187/148 (!) 163/122 (!) 157/132 (!) 105/95  Pulse: 100 90 100 98  Resp: 19 (!) 23 (!) 25 (!) 26  Temp:      TempSrc:      SpO2: 99% 95% 98%  98%  Weight:      Height:       Constitutional: NAD, calm, comfortable Eyes: PERRL, lids and conjunctivae normal ENMT: Mucous membranes are moist. Posterior pharynx clear of any exudate or lesions.Normal dentition.  Neck: normal, supple, no masses. Respiratory: Bibasilar inspiratory crackles. Normal respiratory effort. No accessory muscle use.  Cardiovascular: Regular rate and rhythm, no murmurs / rubs / gallops. No extremity edema. 2+ pedal pulses. Abdomen: no tenderness, no masses palpated. No hepatosplenomegaly. Bowel sounds positive.  Musculoskeletal: no clubbing / cyanosis. No joint deformity upper and lower extremities. Good ROM, no contractures. Normal muscle tone.  Skin: no rashes, lesions, ulcers. No induration Neurologic: CN 2-12 grossly intact. Sensation intact, Strength 5/5 in all 4.  Psychiatric: Normal judgment and insight. Alert and oriented x 3. Normal mood.   Labs on Admission: I have personally reviewed following labs and imaging studies  CBC: Recent Labs  Lab 03/11/20 1155  WBC 6.0  HGB 13.9  HCT 42.7  MCV 94.3  PLT 223   Basic Metabolic Panel: Recent Labs  Lab 03/11/20 1155  NA 140  K 4.2  CL 106  CO2 26  GLUCOSE 101*  BUN 11  CREATININE 1.24  CALCIUM 9.2   GFR: Estimated Creatinine Clearance: 111.1 mL/min (by C-G formula based on SCr of 1.24 mg/dL). Liver Function Tests: No results for input(s): AST, ALT, ALKPHOS, BILITOT, PROT, ALBUMIN in the last 168 hours. No results for input(s): LIPASE, AMYLASE in the last 168 hours. No results for input(s): AMMONIA in the last 168 hours. Coagulation Profile: No results for input(s): INR, PROTIME in the last 168 hours. Cardiac Enzymes: No results for input(s): CKTOTAL, CKMB, CKMBINDEX, TROPONINI in the last 168 hours. BNP (last 3 results) No results for input(s): PROBNP in the last 8760 hours. HbA1C: No results for input(s): HGBA1C in the last 72 hours. CBG: No results for input(s): GLUCAP in the last  168 hours. Lipid Profile: No results for input(s): CHOL, HDL, LDLCALC, TRIG, CHOLHDL, LDLDIRECT in the last 72 hours. Thyroid Function Tests: No results for input(s): TSH, T4TOTAL, FREET4, T3FREE, THYROIDAB in the last 72 hours. Anemia Panel: No results for input(s): VITAMINB12, FOLATE, FERRITIN, TIBC, IRON, RETICCTPCT in the last 72 hours. Urine analysis: No results found for: COLORURINE, APPEARANCEUR, LABSPEC, PHURINE, GLUCOSEU, HGBUR, BILIRUBINUR, KETONESUR, PROTEINUR, UROBILINOGEN, NITRITE, LEUKOCYTESUR  Radiological Exams on Admission: DG Chest 2 View  Result Date: 03/11/2020 CLINICAL DATA:  shortness of breath EXAM: CHEST - 2 VIEW COMPARISON:  04/16/2015. FINDINGS: Mild peribronchial cuffing and diffuse interstitial prominence. Patchy perihilar/basilar opacities. No pneumothorax or pleural effusion. Cardiomediastinal silhouette is unchanged. No acute osseous abnormality. IMPRESSION: Interstitial prominence and patchy perihilar/basilar opacities, infection versus edema. Mild bronchitic changes. Electronically Signed   By: Stana Bunting M.D.   On: 03/11/2020 08:48   CT Angio Chest PE W/Cm &/Or Wo Cm  Result Date: 03/11/2020 CLINICAL DATA:  Shortness of breath and chest wall pain for 2 days. Cough. Increased pain with coughing. EXAM: CT ANGIOGRAPHY CHEST WITH CONTRAST TECHNIQUE: Multidetector CT imaging of the chest was performed using the standard protocol during bolus administration of intravenous contrast. Multiplanar CT image reconstructions and MIPs were obtained to evaluate the vascular anatomy. CONTRAST:  OMNIPAQUE IOHEXOL 350 MG/ML SOLN COMPARISON:  Radiograph earlier today. FINDINGS: Cardiovascular: There are no filling defects within the pulmonary arteries to suggest pulmonary embolus. Multi chamber cardiomegaly. Fusiform dilatation of the ascending aorta at 4.3 cm. Cannot assess for dissection given phase of contrast tailored to pulmonary artery evaluation. Aberrant right  subclavian artery courses posterior to the esophagus. Small amount pericardial fluid in the superior pericardial recess. Mediastinum/Nodes: Increased number of multiple small mediastinal and hilar lymph nodes, nonenlarged by size criteria. No esophageal wall thickening. No visualized thyroid nodule. Lungs/Pleura: Breathing motion artifact partially limits detailed assessment, particularly the at the bases and apices. Suggestion of subtle septal thickening and mild generalized ground-glass density throughout the lungs. Mild central bronchial thickening. No focal airspace disease or confluent consolidation. Trace right pleural effusion. Minimal left pleural thickening. Upper Abdomen: Suspected hepatic steatosis.  No acute findings. Musculoskeletal: There are no acute or suspicious osseous abnormalities. Review of the MIP images confirms the above findings. IMPRESSION: 1. No pulmonary embolus. 2. Multi chamber cardiomegaly. Mild septal thickening in generalized ground-glass density of the lungs suggest pulmonary edema. Trace right pleural effusion. Overall findings suggest fluid overload. 3. Mild central bronchial thickening, which may be bronchitic or congestive. 4. Fusiform dilatation of the ascending aorta at 4.3 cm. Recommend annual imaging followup by CTA or MRA. This recommendation follows 2010 ACCF/AHA/AATS/ACR/ASA/SCA/SCAI/SIR/STS/SVM Guidelines for the Diagnosis and Management of Patients with Thoracic Aortic Disease. Circulation. 2010; 121: J194-R740. Aortic aneurysm NOS (ICD10-I71.9) 5. 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 Aortic Atherosclerosis (ICD10-I70.0). Electronically Signed   By: Narda Rutherford M.D.   On: 03/11/2020 17:09    EKG: Personally reviewed. Sinus tachycardia, rate 101, LAE, LVH, QTC 480, nonspecific T wave changes lateral leads.  LVH new when compared to prior.  Assessment/Plan Principal Problem:   Acute CHF (congestive heart failure) (HCC) Active  Problems:   Essential hypertension   Ascending aorta dilatation (HCC)   COVID-19 virus detected  Joseph Ray is a 44 y.o. male with medical history significant for hypertension, obesity who is admitted with suspected acute CHF.  Pulmonary edema suspected due to acute CHF: Patient without orthopnea, PND, and evidence of cardiomegaly with pulmonary edema on imaging.  Symptoms worsened with high dietary salt load prior to admission.  He has been given IV Lasix in the ED with good urine output so far.  Troponin mildly elevated and downtrending on repeat.  Likely demand from acute CHF. -Continue IV Lasix 40 mg twice daily -Obtain echocardiogram -Monitor strict I/O's and daily weights  Positive COVID-19 test: SARS-CoV-2 PCR has resulted positive on admission.  Presenting symptoms and imaging still suggestive of CHF.  Will hold off on directed Covid therapy at this time however will need to closely monitor with low threshold to initiate remdesivir and steroid treatment.  Currently respiratory status has improved with initial IV Lasix and he is saturating well on room air. -Continue contact and airborne precautions  Hypertension: Not on antihypertensives as an outpatient.  Start losartan 25 mg daily.  Continue IV Lasix diuresis as above.  Ascending aortic dilatation: Fusiform dilatation of the ascending aorta at 4.3 cm  identified on CT imaging.  Annual imaging follow-up by CTA or MRI recommended.  Suspected sleep apnea: Patient states he is planning on outpatient sleep study for further evaluation.  DVT prophylaxis: Lovenox Code Status: Full code, confirmed with patient Family Communication: Discussed with patient, he has discussed with family Disposition Plan: From home and likely discharge to home pending symptomatic improvement with adequate diuresis and further evaluation with echocardiogram Consults called: None Admission status:  Status is: Observation  The patient remains OBS  appropriate and will d/c before 2 midnights.  Dispo: The patient is from: Home              Anticipated d/c is to: Home              Anticipated d/c date is: 1 day              Patient currently is not medically stable to d/c.    Darreld Mclean MD [facility]  If 7PM-7AM, please contact night-coverage [email or web address]  03/11/2020, 9:49 PM

## 2020-03-11 NOTE — ED Notes (Signed)
ED Provider at bedside. 

## 2020-03-11 NOTE — ED Triage Notes (Signed)
Pt c/o ShOB and chest wall pain x2 days.  Denies fevers.  Reports slight productive cough. Reports worsening chest pain when coughing.

## 2020-03-11 NOTE — ED Provider Notes (Signed)
[facility]-EMERGENCY DEPT Provider Note   CSN: [id number] Arrival date & time: 03/11/20  0813     History Chief Complaint  Patient presents with  . Shortness of Breath  . Chest Pain    AXXEL GUDE is a 44 y.o. male with a hx of tobacco abuse who presents to the ED with complaints of shortness of breath of the past 2 days.  Patient states that he has had nasal congestion with cough productive of mucus phlegm sputum for the past few days as well dyspnea & chest tightness. Last night the dyspnea & chest tightness seemed to get worse which concerned him prompting ED visit. He feels a bit better now since being in the ED. He had some fever a few days ago but this resolved. His family members are in the nursing field and wanted him to come get checked out. Denies leg pain/swelling, hemoptysis, recent surgery/trauma, recent long travel, hormone use, personal hx of cancer, or hx of DVT/PE. He has received both doses of the COVID 19 vaccine.   HPI     Past Medical History:  Diagnosis Date  . Achilles tendon tear    left  . Snores     There are no problems to display for this patient.   Past Surgical History:  Procedure Laterality Date  . ACHILLES TENDON SURGERY Left 03/25/2013   Procedure: LEFT ACHILLES TENDON REPAIR;  Surgeon: [name], MD;  Location: [facility];  Service: Orthopedics;  Laterality: Left;       Family History  Problem Relation Age of Onset  . Hypertension Mother   . Hypertension Father     Social History   Tobacco Use  . Smoking status: Current Some Day Smoker    Packs/day: 0.15  . Smokeless tobacco: Never Used  Vaping Use  . Vaping Use: Never used  Substance Use Topics  . Alcohol use: Yes    Comment: social  . Drug use: No    Home Medications Prior to Admission medications   Medication Sig Start Date End Date Taking? Authorizing Provider  fluticasone (FLONASE) 50 MCG/ACT nasal spray Place 1 spray into  both nostrils in the morning and at bedtime. 11/29/19   Sudie Grumbling, NP    Allergies    Penicillins  Review of Systems   Review of Systems  Constitutional: Positive for fever (subjective).  HENT: Positive for congestion. Negative for ear pain.   Respiratory: Positive for cough and shortness of breath.   Cardiovascular: Positive for chest pain. Negative for leg swelling.  Gastrointestinal: Negative for abdominal pain, blood in stool, diarrhea and vomiting.  Genitourinary: Negative for dysuria.  Neurological: Negative for syncope.  All other systems reviewed and are negative.   Physical Exam Updated Vital Signs BP (!) 173/134 (BP Location: Right Arm) Comment: checked both arms Left arm 173/134  Pulse (!) 101   Temp 99.5 F (37.5 C) (Oral)   Resp 18   Ht 6\' 3"  (1.905 m)   Wt 131.5 kg   SpO2 97%   BMI 36.25 kg/m   Physical Exam Vitals and nursing note reviewed.  Constitutional:      General: He is not in acute distress.    Appearance: He is well-developed. He is not toxic-appearing.  HENT:     Head: Normocephalic and atraumatic.     Right Ear: Ear canal normal. Tympanic membrane is not perforated, erythematous, retracted or bulging.     Left Ear: Ear canal normal.  Tympanic membrane is not perforated, erythematous, retracted or bulging.     Ears:     Comments: No mastoid erythema/swellng/tenderness.     Nose: Congestion present.     Right Sinus: No maxillary sinus tenderness or frontal sinus tenderness.     Left Sinus: No maxillary sinus tenderness or frontal sinus tenderness.     Mouth/Throat:     Pharynx: Oropharynx is clear. Uvula midline. No oropharyngeal exudate or posterior oropharyngeal erythema.     Comments: Posterior oropharynx is symmetric appearing. Patient tolerating own secretions without difficulty. No trismus. No drooling. No hot potato voice. No swelling beneath the tongue, submandibular compartment is soft.  Eyes:     General:        Right eye: No  discharge.        Left eye: No discharge.     Conjunctiva/sclera: Conjunctivae normal.  Cardiovascular:     Rate and Rhythm: Regular rhythm. Tachycardia present.  Pulmonary:     Effort: No respiratory distress.     Breath sounds: Decreased breath sounds (bibasilar) present. No wheezing, rhonchi or rales.  Abdominal:     General: There is no distension.     Palpations: Abdomen is soft.     Tenderness: There is no abdominal tenderness.  Musculoskeletal:     Cervical back: Neck supple. No rigidity.     Comments: Trace to 1+ symmetric pitting edema to the bilateral lower legs without overlying erythema or palpable calf tenderness.  Lymphadenopathy:     Cervical: No cervical adenopathy.  Skin:    General: Skin is warm and dry.     Findings: No rash.  Neurological:     Mental Status: He is alert.     Comments: Clear speech.   Psychiatric:        Behavior: Behavior normal.    ED Results / Procedures / Treatments   Labs (all labs ordered are listed, but only abnormal results are displayed) Labs Reviewed  BASIC METABOLIC PANEL - Abnormal; Notable for the following components:      Result Value   Glucose, Bld 101 (*)    All other components within normal limits  BRAIN NATRIURETIC PEPTIDE - Abnormal; Notable for the following components:   B Natriuretic Peptide 291.4 (*)    All other components within normal limits  TROPONIN I (HIGH SENSITIVITY) - Abnormal; Notable for the following components:   Troponin I (High Sensitivity) 84 (*)    All other components within normal limits  TROPONIN I (HIGH SENSITIVITY) - Abnormal; Notable for the following components:   Troponin I (High Sensitivity) 65 (*)    All other components within normal limits  RESP PANEL BY RT-PCR (FLU A&B, COVID) ARPGX2  CBC  POC SARS CORONAVIRUS 2 AG -  ED    EKG EKG Interpretation  Date/Time:  Wednesday March 11 2020 08:29:58 EST Ventricular Rate:  101 PR Interval:    QRS Duration: 101 QT  Interval:  370 QTC Calculation: 480 R Axis:   -4 Text Interpretation: Sinus tachycardia LAE, consider biatrial enlargement Left ventricular hypertrophy Borderline T abnormalities, lateral leads Borderline prolonged QT interval Since last tracing LVH is present Otherwise no significant change Confirmed by Mancel Bale [phone]) on 03/11/2020 8:49:08 AM   Radiology DG Chest 2 View  Result Date: 03/11/2020 CLINICAL DATA:  shortness of breath EXAM: CHEST - 2 VIEW COMPARISON:  04/16/2015. FINDINGS: Mild peribronchial cuffing and diffuse interstitial prominence. Patchy perihilar/basilar opacities. No pneumothorax or pleural effusion. Cardiomediastinal silhouette is unchanged. No acute  osseous abnormality. IMPRESSION: Interstitial prominence and patchy perihilar/basilar opacities, infection versus edema. Mild bronchitic changes. Electronically Signed   By: Stana Bunting M.D.   On: 03/11/2020 08:48   CT Angio Chest PE W/Cm &/Or Wo Cm  Result Date: 03/11/2020 CLINICAL DATA:  Shortness of breath and chest wall pain for 2 days. Cough. Increased pain with coughing. EXAM: CT ANGIOGRAPHY CHEST WITH CONTRAST TECHNIQUE: Multidetector CT imaging of the chest was performed using the standard protocol during bolus administration of intravenous contrast. Multiplanar CT image reconstructions and MIPs were obtained to evaluate the vascular anatomy. CONTRAST:  OMNIPAQUE IOHEXOL 350 MG/ML SOLN COMPARISON:  Radiograph earlier today. FINDINGS: Cardiovascular: There are no filling defects within the pulmonary arteries to suggest pulmonary embolus. Multi chamber cardiomegaly. Fusiform dilatation of the ascending aorta at 4.3 cm. Cannot assess for dissection given phase of contrast tailored to pulmonary artery evaluation. 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 Small amount pericardial fluid in the superior pericardial recess. Mediastinum/Nodes: Increased number of multiple small  mediastinal and hilar lymph nodes, nonenlarged by size criteria. No esophageal wall thickening. No visualized thyroid nodule. Lungs/Pleura: Breathing motion artifact partially limits detailed assessment, particularly the at the bases and apices. Suggestion of subtle septal thickening and mild generalized ground-glass density throughout the lungs. Mild central bronchial thickening. No focal airspace disease or confluent consolidation. Trace right pleural effusion. Minimal left pleural thickening. Upper Abdomen: Suspected hepatic steatosis.  No acute findings. Musculoskeletal: There are no acute or suspicious osseous abnormalities. Review of the MIP images confirms the above findings. IMPRESSION: 1. No pulmonary embolus. 2. Multi chamber cardiomegaly. Mild septal thickening in generalized ground-glass density of the lungs suggest pulmonary edema. Trace right pleural effusion. Overall findings suggest fluid overload. 3. Mild central bronchial thickening, which may be bronchitic or congestive. 4. Fusiform dilatation of the ascending aorta at 4.3 cm. Recommend annual imaging followup by CTA or MRA. This recommendation follows 2010 ACCF/AHA/AATS/ACR/ASA/SCA/SCAI/SIR/STS/SVM Guidelines for the Diagnosis and Management of Patients with Thoracic Aortic Disease. Circulation. 2010; 121: R975-O832. Aortic aneurysm NOS (ICD10-I71.9) 5. 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 Aortic Atherosclerosis (ICD10-I70.0). Electronically Signed   By: Narda Rutherford M.D.   On: 03/11/2020 17:09    Procedures Procedures (including critical care time)  Medications Ordered in ED Medications  furosemide (LASIX) injection 40 mg (has no administration in time range)  potassium chloride SA (KLOR-CON) CR tablet 20 mEq (has no administration in time range)  iohexol (OMNIPAQUE) 350 MG/ML injection 100 mL (100 mLs Intravenous Contrast Given 03/11/20 1654)    ED Course  I have reviewed the triage vital signs  and the nursing notes.  Pertinent labs & imaging results that were available during my care of the patient were reviewed by me and considered in my medical decision making (see chart for details).    MDM Rules/Calculators/A&P                          Patient presents to the ED with complaints of cough, chest tightness, & dyspnea.    DDx: Pneumonia, COVID-19, CHF, pulmonary embolism, atypical ACS  Additional history obtained:  Additional history obtained from chart review & nursing note review.   EKG: No STEMI.   Lab Tests:  I Ordered, reviewed, and interpreted labs, which included:  CBC/BMP: Unremarkable Troponin: Elevated @ 84--> downtrending BNP: elevated   Imaging Studies ordered:  CXR ordered by triage, I independently visualized and interpreted imaging which showed  Interstitial prominence and patchy perihilar/basilar opacities, infection versus edema. Mild bronchitic changes. Subsequently ordered CTA for further assessment: No pulmonary embolism, multichamber cardiomegaly with findings suggestive of pulmonary edema as well as a trace right pleural effusion.  Additional findings as above.  Overall presentation is concerning for new onset heart failure.  Feel patient would benefit from admission for this with echocardiogram & diuresis, will start lasix in the emergency department and discuss with hospitalist service.  I discussed results and plan of care with the patient who is in agreement.  Findings and plan of care discussed with supervising physician Dr. Stevie Kern who is in agreement.   18:37: CONSULT: Discussed with hospitalist Dr. Allena Katz- accepts admission.   Portions of this note were generated with Scientist, clinical (histocompatibility and immunogenetics). Dictation errors may occur despite best attempts at proofreading.  Final Clinical Impression(s) / ED Diagnoses Final diagnoses:  Acute congestive heart failure, unspecified heart failure type [facility])    Rx / DC Orders ED Discharge Orders    None        Cherly Anderson, PA-C 03/11/20 1848    Milagros Loll, MD 03/13/20 0009

## 2020-03-12 ENCOUNTER — Observation Stay (HOSPITAL_BASED_OUTPATIENT_CLINIC_OR_DEPARTMENT_OTHER): Payer: HRSA Program

## 2020-03-12 DIAGNOSIS — I5041 Acute combined systolic (congestive) and diastolic (congestive) heart failure: Secondary | ICD-10-CM

## 2020-03-12 DIAGNOSIS — I429 Cardiomyopathy, unspecified: Secondary | ICD-10-CM | POA: Diagnosis not present

## 2020-03-12 DIAGNOSIS — I10 Essential (primary) hypertension: Secondary | ICD-10-CM

## 2020-03-12 DIAGNOSIS — U071 COVID-19: Secondary | ICD-10-CM

## 2020-03-12 DIAGNOSIS — I509 Heart failure, unspecified: Secondary | ICD-10-CM | POA: Diagnosis not present

## 2020-03-12 LAB — ECHOCARDIOGRAM LIMITED
Area-P 1/2: 3.85 cm2
Height: 75 in
S' Lateral: 4.9 cm
Weight: 4640 oz

## 2020-03-12 LAB — CBC WITH DIFFERENTIAL/PLATELET
Abs Immature Granulocytes: 0.01 10*3/uL (ref 0.00–0.07)
Basophils Absolute: 0 10*3/uL (ref 0.0–0.1)
Basophils Relative: 0 %
Eosinophils Absolute: 0.2 10*3/uL (ref 0.0–0.5)
Eosinophils Relative: 3 %
HCT: 41.4 % (ref 39.0–52.0)
Hemoglobin: 13.8 g/dL (ref 13.0–17.0)
Immature Granulocytes: 0 %
Lymphocytes Relative: 29 %
Lymphs Abs: 1.6 10*3/uL (ref 0.7–4.0)
MCH: 30.7 pg (ref 26.0–34.0)
MCHC: 33.3 g/dL (ref 30.0–36.0)
MCV: 92 fL (ref 80.0–100.0)
Monocytes Absolute: 0.9 10*3/uL (ref 0.1–1.0)
Monocytes Relative: 17 %
Neutro Abs: 2.8 10*3/uL (ref 1.7–7.7)
Neutrophils Relative %: 51 %
Platelets: 208 10*3/uL (ref 150–400)
RBC: 4.5 MIL/uL (ref 4.22–5.81)
RDW: 14 % (ref 11.5–15.5)
WBC: 5.6 10*3/uL (ref 4.0–10.5)
nRBC: 0 % (ref 0.0–0.2)

## 2020-03-12 LAB — LIPID PANEL
Cholesterol: 193 mg/dL (ref 0–200)
HDL: 26 mg/dL — ABNORMAL LOW (ref >40)
LDL Cholesterol: 137 mg/dL — ABNORMAL HIGH (ref 0–99)
Total CHOL/HDL Ratio: 7.4 RATIO
Triglycerides: 152 mg/dL — ABNORMAL HIGH (ref <150)
VLDL: 30 mg/dL (ref 0–40)

## 2020-03-12 LAB — PHOSPHORUS: Phosphorus: 4.3 mg/dL (ref 2.5–4.6)

## 2020-03-12 LAB — RAPID URINE DRUG SCREEN, HOSP PERFORMED
Amphetamines: NOT DETECTED
Barbiturates: NOT DETECTED
Benzodiazepines: NOT DETECTED
Cocaine: NOT DETECTED
Opiates: NOT DETECTED
Tetrahydrocannabinol: POSITIVE — AB

## 2020-03-12 LAB — COMPREHENSIVE METABOLIC PANEL
ALT: 81 U/L — ABNORMAL HIGH (ref 0–44)
AST: 54 U/L — ABNORMAL HIGH (ref 15–41)
Albumin: 4.1 g/dL (ref 3.5–5.0)
Alkaline Phosphatase: 42 U/L (ref 38–126)
Anion gap: 12 (ref 5–15)
BUN: 15 mg/dL (ref 6–20)
CO2: 23 mmol/L (ref 22–32)
Calcium: 9.1 mg/dL (ref 8.9–10.3)
Chloride: 104 mmol/L (ref 98–111)
Creatinine, Ser: 1.23 mg/dL (ref 0.61–1.24)
GFR, Estimated: >60 mL/min (ref >60)
Glucose, Bld: 100 mg/dL — ABNORMAL HIGH (ref 70–99)
Potassium: 3.6 mmol/L (ref 3.5–5.1)
Sodium: 139 mmol/L (ref 135–145)
Total Bilirubin: 0.9 mg/dL (ref 0.3–1.2)
Total Protein: 7.7 g/dL (ref 6.5–8.1)

## 2020-03-12 LAB — HEMOGLOBIN A1C
Hgb A1c MFr Bld: 5.9 % — ABNORMAL HIGH (ref 4.8–5.6)
Mean Plasma Glucose: 122.63 mg/dL

## 2020-03-12 LAB — D-DIMER, QUANTITATIVE: D-Dimer, Quant: 2.07 ug/mL-FEU — ABNORMAL HIGH (ref 0.00–0.50)

## 2020-03-12 LAB — HIV ANTIBODY (ROUTINE TESTING W REFLEX): HIV Screen 4th Generation wRfx: NONREACTIVE

## 2020-03-12 LAB — MAGNESIUM: Magnesium: 2.1 mg/dL (ref 1.7–2.4)

## 2020-03-12 LAB — FERRITIN: Ferritin: 502 ng/mL — ABNORMAL HIGH (ref 24–336)

## 2020-03-12 LAB — C-REACTIVE PROTEIN: CRP: 1.3 mg/dL — ABNORMAL HIGH (ref <1.0)

## 2020-03-12 MED ORDER — SACUBITRIL-VALSARTAN 24-26 MG PO TABS
1.0000 | ORAL_TABLET | Freq: Two times a day (BID) | ORAL | Status: DC
  Administered 2020-03-12 – 2020-03-13 (×2): 1 via ORAL
  Filled 2020-03-12 (×2): qty 1

## 2020-03-12 MED ORDER — FUROSEMIDE 40 MG PO TABS
40.0000 mg | ORAL_TABLET | Freq: Every day | ORAL | Status: DC
  Administered 2020-03-13: 40 mg via ORAL
  Filled 2020-03-12: qty 1

## 2020-03-12 MED ORDER — CARVEDILOL 3.125 MG PO TABS
3.1250 mg | ORAL_TABLET | Freq: Two times a day (BID) | ORAL | Status: DC
  Administered 2020-03-12: 19:00:00 3.125 mg via ORAL
  Filled 2020-03-12: qty 1

## 2020-03-12 NOTE — Progress Notes (Signed)
*  PRELIMINARY RESULTS* Echocardiogram 2D Echocardiogram has been performed.  Pieter Partridge 03/12/2020, 8:42 AM

## 2020-03-12 NOTE — ED Notes (Addendum)
Patient's O2 sats consistently falling into the low 90s and high 80s. Placed on 2 L O2 via nasal cannula.

## 2020-03-12 NOTE — Plan of Care (Signed)
Care plan initiated.

## 2020-03-12 NOTE — Progress Notes (Signed)
PROGRESS NOTE    Joseph Ray  WGN:[id number] DOB: 09-10-1975 DOA: 03/11/2020 PCP: Patient, No Pcp Per    Brief Narrative:  44 y.o. male with medical history significant for hypertension, obesity who presents to the ED for evaluation of shortness of breath.  Patient states 3-4 years ago he developed cough productive of clear/white sputum.  He felt as if he was coming down with an upper respiratory infection therefore was try to support himself with TheraFlu, fluids, and soup.  Over the last 2 days he developed new onset of shortness of breath, worse when lying flat.  He has had associated paroxysmal nocturnal dyspnea.  He has occasional sensation of palpitations.  He denies any significant dyspnea on exertion.  He says he had seen swelling around his ankle several months ago which resolved on its own and has not seen any recent lower extremity edema.  He says he does snore at night and is planning on undergoing a sleep study at some point.  He does report tobacco use, 1 pack lasting up to 1 week.  He reports occasional alcohol use in a social setting.  He admits to marijuana use.  He denies any cocaine or IV drug use.  He reports a history of high blood pressure in both of his parents.  ED Course:  Initial vitals showed BP 173/134, pulse 101, RR 18, temp 99.5 F, SPO2 97% on room air.  Labs show sodium 140, potassium 4.2, bicarb 26, BUN 11, creatinine 1.24, serum glucose 101, WBC 6.0, hemoglobin 13.9, platelets 223,000, BNP 291.4, high-sensitivity troponin I 84 > 65.  SARS-CoV-2 antigen is negative.  SARS-CoV-2 PCR panel is ordered and pending.  Two view chest x-ray shows diffuse interstitial prominence with patchy perihilar/basilar opacities.  CTA chest PE study is negative for PE.  Cardiomegaly and changes suggestive of generalized pulmonary edema are seen.  Trace right pleural effusion noted.  A fusiform dilatation of the ascending aorta at 4.3 cm also identified.  Patient was  given IV Lasix 40 mg and oral K 20 mEq once.  The hospitalist service was consulted to admit for further evaluation and management.  Assessment & Plan:   Principal Problem:   Acute CHF (congestive heart failure) (HCC) Active Problems:   Essential hypertension   Ascending aorta dilatation (HCC)   COVID-19 virus detected  #NEW ONSET SYSTOLIC  CHF-admitted woth doe pnd and lower extremity edema Chest xray pulmonary edema Will continue IV lasix Consulted cardiology appreciate in put Will need further work up Follow I AN O negative 800 cc On cozaar Monitor renal functions on iv diuresis  #covid positive on 2 L o2.supportive treatment  #Polysubstance abuse uds pending  # HTN  Continue currrent meds  #Possible sleep apnea needs outpatient w/u  #Aortic dilatation- yearly f/u   Estimated body mass index is 36.25 kg/m as calculated from the following:   Height as of this encounter:  (1.905 m).   Weight as of this encounter: 131.5 kg.  DVT prophylaxis:lovenox Code Status:full Family Communication:none Disposition Plan:  Status is: in patient   Dispo: The patient is from: Home              Anticipated d/c is to: Home              Anticipated d/c date is: 2 days              Patient currently is not medically stable to d/c.   Consultants:   cardiology  Procedures:none  Antimicrobials:none  Subjective: C/o dyspenea on exertion with desaturation  Objective: Vitals:   03/12/20 0500 03/12/20 0615 03/12/20 0903 03/12/20 0929  BP: (!) 170/151 (!) 149/113 (!) 140/104 (!) 150/118  Pulse: 100 89 89 98  Resp: (!) 22 19 20 20   Temp:    98.6 F (37 C)  TempSrc:    Oral  SpO2: 99% 94% 94% 98%  Weight:      Height:        Intake/Output Summary (Last 24 hours) at 03/12/2020 1356 Last data filed at 03/12/2020 0233 Gross per 24 hour  Intake --  Output 800 ml  Net -800 ml   Filed Weights   03/11/20 0830  Weight: 131.5 kg    Examination:  General exam: Appears  calm and comfortable  Respiratory system: diminished at the bases  to auscultation. Respiratory effort normal. Cardiovascular system: S1 & S2 heard, RRR. No JVD, murmurs, rubs, gallops or clicks. No pedal edema. Gastrointestinal system: Abdomen is nondistended, soft and nontender. No organomegaly or masses felt. Normal bowel sounds heard. Central nervous system: Alert and oriented. No focal neurological deficits. Extremities: Symmetric 5 x 5 power. Skin: No rashes, lesions or ulcers Psychiatry: Judgement and insight appear normal. Mood & affect appropriate.     Data Reviewed: I have personally reviewed following labs and imaging studies  CBC: Recent Labs  Lab 03/11/20 1155 03/12/20 0455  WBC 6.0 5.6  NEUTROABS  --  2.8  HGB 13.9 13.8  HCT 42.7 41.4  MCV 94.3 92.0  PLT 223 208   Basic Metabolic Panel: Recent Labs  Lab 03/11/20 1155 03/12/20 0454 03/12/20 0455  NA 140 139  --   K 4.2 3.6  --   CL 106 104  --   CO2 26 23  --   GLUCOSE 101* 100*  --   BUN 11 15  --   CREATININE 1.24 1.23  --   CALCIUM 9.2 9.1  --   MG  --   --  2.1  PHOS  --  4.3  --    GFR: Estimated Creatinine Clearance: 112 mL/min (by C-G formula based on SCr of 1.23 mg/dL). Liver Function Tests: Recent Labs  Lab 03/12/20 0454  AST 54*  ALT 81*  ALKPHOS 42  BILITOT 0.9  PROT 7.7  ALBUMIN 4.1   No results for input(s): LIPASE, AMYLASE in the last 168 hours. No results for input(s): AMMONIA in the last 168 hours. Coagulation Profile: No results for input(s): INR, PROTIME in the last 168 hours. Cardiac Enzymes: No results for input(s): CKTOTAL, CKMB, CKMBINDEX, TROPONINI in the last 168 hours. BNP (last 3 results) No results for input(s): PROBNP in the last 8760 hours. HbA1C: No results for input(s): HGBA1C in the last 72 hours. CBG: No results for input(s): GLUCAP in the last 168 hours. Lipid Profile: No results for input(s): CHOL, HDL, LDLCALC, TRIG, CHOLHDL, LDLDIRECT in the last 72  hours. Thyroid Function Tests: No results for input(s): TSH, T4TOTAL, FREET4, T3FREE, THYROIDAB in the last 72 hours. Anemia Panel: Recent Labs    03/12/20 0454  FERRITIN 502*   Sepsis Labs: No results for input(s): PROCALCITON, LATICACIDVEN in the last 168 hours.  Recent Results (from the past 240 hour(s))  Resp Panel by RT-PCR (Flu A&B, Covid) Nasopharyngeal Swab     Status: Abnormal   Collection Time: 03/11/20  7:26 PM   Specimen: Nasopharyngeal Swab; Nasopharyngeal(NP) swabs in vial transport medium  Result Value Ref Range Status   SARS Coronavirus 2  by RT PCR POSITIVE (A) NEGATIVE Final    Comment: RESULT CALLED TO, READ BACK BY AND VERIFIED WITH: LOWDERMILK J. 12.29.21 @ 2123 BY MECIAL J.  (NOTE) SARS-CoV-2 target nucleic acids are DETECTED.  The SARS-CoV-2 RNA is generally detectable in upper respiratory specimens during the acute phase of infection. Positive results are indicative of the presence of the identified virus, but do not rule out bacterial infection or co-infection with other pathogens not detected by the test. Clinical correlation with patient history and other diagnostic information is necessary to determine patient infection status. The expected result is Negative.  Fact Sheet for Patients: BloggerCourse.com  Fact Sheet for Healthcare Providers: SeriousBroker.it  This test is not yet approved or cleared by the [place] FDA and  has been authorized for detection and/or diagnosis of SARS-CoV-2 by FDA under an Emergency Use Authorization (EUA).  This EUA will remain in effect (meaning this t est can be used) for the duration of  the COVID-19 declaration under Section 564(b)(1) of the Act, 21 U.S.C. section 360bbb-3(b)(1), unless the authorization is terminated or revoked sooner.     Influenza A by PCR NEGATIVE NEGATIVE Final   Influenza B by PCR NEGATIVE NEGATIVE Final    Comment: (NOTE) The  Xpert Xpress SARS-CoV-2/FLU/RSV plus assay is intended as an aid in the diagnosis of influenza from Nasopharyngeal swab specimens and should not be used as a sole basis for treatment. Nasal washings and aspirates are unacceptable for Xpert Xpress SARS-CoV-2/FLU/RSV testing.  Fact Sheet for Patients: BloggerCourse.com  Fact Sheet for Healthcare Providers: SeriousBroker.it  This test is not yet approved or cleared by the [place] FDA and has been authorized for detection and/or diagnosis of SARS-CoV-2 by FDA under an Emergency Use Authorization (EUA). This EUA will remain in effect (meaning this test can be used) for the duration of the COVID-19 declaration under Section 564(b)(1) of the Act, 21 U.S.C. section 360bbb-3(b)(1), unless the authorization is terminated or revoked.  Performed at [facility], [address], [place], [place] [address]          Radiology Studies: DG Chest 2 View  Result Date: 03/11/2020 CLINICAL DATA:  shortness of breath EXAM: CHEST - 2 VIEW COMPARISON:  04/16/2015. FINDINGS: Mild peribronchial cuffing and diffuse interstitial prominence. Patchy perihilar/basilar opacities. No pneumothorax or pleural effusion. Cardiomediastinal silhouette is unchanged. No acute osseous abnormality. IMPRESSION: Interstitial prominence and patchy perihilar/basilar opacities, infection versus edema. Mild bronchitic changes. Electronically Signed   By: Stana Bunting M.D.   On: 03/11/2020 08:48   CT Angio Chest PE W/Cm &/Or Wo Cm  Result Date: 03/11/2020 CLINICAL DATA:  Shortness of breath and chest wall pain for 2 days. Cough. Increased pain with coughing. EXAM: CT ANGIOGRAPHY CHEST WITH CONTRAST TECHNIQUE: Multidetector CT imaging of the chest was performed using the standard protocol during bolus administration of intravenous contrast. Multiplanar CT image reconstructions and MIPs were obtained  to evaluate the vascular anatomy. CONTRAST:  OMNIPAQUE IOHEXOL 350 MG/ML SOLN COMPARISON:  Radiograph earlier today. FINDINGS: Cardiovascular: There are no filling defects within the pulmonary arteries to suggest pulmonary embolus. Multi chamber cardiomegaly. Fusiform dilatation of the ascending aorta at 4.3 cm. Cannot assess for dissection given phase of contrast tailored to pulmonary artery evaluation. 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 Small amount pericardial fluid in the superior pericardial recess. Mediastinum/Nodes: Increased number of multiple small mediastinal and hilar lymph nodes, nonenlarged by size criteria. No esophageal wall thickening. No visualized thyroid nodule.  Lungs/Pleura: Breathing motion artifact partially limits detailed assessment, particularly the at the bases and apices. Suggestion of subtle septal thickening and mild generalized ground-glass density throughout the lungs. Mild central bronchial thickening. No focal airspace disease or confluent consolidation. Trace right pleural effusion. Minimal left pleural thickening. Upper Abdomen: Suspected hepatic steatosis.  No acute findings. Musculoskeletal: There are no acute or suspicious osseous abnormalities. Review of the MIP images confirms the above findings. IMPRESSION: 1. No pulmonary embolus. 2. Multi chamber cardiomegaly. Mild septal thickening in generalized ground-glass density of the lungs suggest pulmonary edema. Trace right pleural effusion. Overall findings suggest fluid overload. 3. Mild central bronchial thickening, which may be bronchitic or congestive. 4. Fusiform dilatation of the ascending aorta at 4.3 cm. Recommend annual imaging followup by CTA or MRA. This recommendation follows 2010 ACCF/AHA/AATS/ACR/ASA/SCA/SCAI/SIR/STS/SVM Guidelines for the Diagnosis and Management of Patients with Thoracic Aortic Disease. Circulation. 2010; 121: P824-M353. Aortic aneurysm NOS (ICD10-I71.9) 5.  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 Aortic Atherosclerosis (ICD10-I70.0). Electronically Signed   By: Narda Rutherford M.D.   On: 03/11/2020 17:09   ECHOCARDIOGRAM LIMITED  Result Date: 03/12/2020    ECHOCARDIOGRAM LIMITED REPORT   Patient Name:   Joseph Ray Date of Exam: 03/12/2020 Medical Rec #:  [id number]      Height:       75.0 in Accession #:    [id number]     Weight:       290.0 lb Date of Birth:  Oct 07, 1975      BSA:          2.570 m Patient Age:    44 years       BP:           149/113 mmHg Patient Gender: M              HR:           89 bpm. Exam Location:  Inpatient Procedure: Limited Echo, Cardiac Doppler and Color Doppler Indications:    CHF  History:        Patient has no prior history of Echocardiogram examinations.                 Signs/Symptoms:Shortness of Breath and Pul. edema; Risk                 Factors:Hypertension, Current Smoker and Obesity. COVID +.  Sonographer:    Lavenia Atlas Referring Phys: [id number] VISHAL R PATEL IMPRESSIONS  1. Left ventricular ejection fraction, by estimation, is 30 to 35%. The left ventricle has moderately decreased function. The left ventricle demonstrates global hypokinesis. The left ventricular internal cavity size was moderately dilated. There is moderate left ventricular hypertrophy. Left ventricular diastolic parameters are consistent with Grade I diastolic dysfunction (impaired relaxation).  2. The mitral valve is normal in structure. Mild mitral valve regurgitation.  3. The aortic valve is normal in structure. Aortic valve regurgitation is not visualized. No aortic stenosis is present.  4. The inferior vena cava is normal in size with greater than 50% respiratory variability, suggesting right atrial pressure of 3 mmHg. FINDINGS  Left Ventricle: Left ventricular ejection fraction, by estimation, is 30 to 35%. The left ventricle has moderately decreased function. The left ventricle demonstrates global hypokinesis.  The left ventricular internal cavity size was moderately dilated. There is moderate left ventricular hypertrophy. Left ventricular diastolic parameters are consistent with Grade I diastolic dysfunction (impaired relaxation). Mitral Valve: The mitral valve is normal in structure. Mild mitral valve  regurgitation. Tricuspid Valve: The tricuspid valve is normal in structure. Tricuspid valve regurgitation is not demonstrated. Aortic Valve: The aortic valve is normal in structure. Aortic valve regurgitation is not visualized. No aortic stenosis is present. Venous: The inferior vena cava is normal in size with greater than 50% respiratory variability, suggesting right atrial pressure of 3 mmHg. LEFT VENTRICLE PLAX 2D LVIDd:         5.90 cm  Diastology LVIDs:         4.90 cm  LV e' medial:    5.11 cm/s LV PW:         1.60 cm  LV E/e' medial:  11.9 LV IVS:        1.50 cm  LV e' lateral:   5.77 cm/s LVOT diam:     2.50 cm  LV E/e' lateral: 10.5 LVOT Area:     4.91 cm  LEFT ATRIUM         Index LA diam:    5.80 cm 2.26 cm/m   AORTA Ao Root diam: 3.50 cm MITRAL VALVE MV Area (PHT): 3.85 cm    SHUNTS MV Decel Time: 197 msec    Systemic Diam: 2.50 cm MV E velocity: 60.80 cm/s MV A velocity: 60.00 cm/s MV E/A ratio:  1.01 Donato Schultz MD Electronically signed by Donato Schultz MD Signature Date/Time: 03/12/2020/10:59:06 AM    Final         Scheduled Meds: . vitamin C  500 mg Oral Daily  . enoxaparin (LOVENOX) injection  40 mg Subcutaneous Q24H  . furosemide  40 mg Intravenous Q12H  . losartan  25 mg Oral Daily  . sodium chloride flush  3 mL Intravenous Q12H  . zinc sulfate  220 mg Oral Daily   Continuous Infusions: . sodium chloride       LOS: 0 days   Alwyn Ren, MD 03/12/2020, 1:56 PM

## 2020-03-12 NOTE — ED Notes (Signed)
Echo at bedside

## 2020-03-12 NOTE — Progress Notes (Signed)
Patient states urine accidentally thrown out despite telling him we need a urine sample. States he will call when has voided again. Will inform night nurse.

## 2020-03-12 NOTE — Consult Note (Addendum)
Cardiology Consultation:   Due to the COVID-19 pandemic, this visit was completed with telemedicine (audio/video) technology to reduce patient and provider exposure as well as to preserve personal protective equipment.   Patient ID: Joseph Ray MRN: [id number]; DOB: 11-20-1975  Admit date: 03/11/2020 Date of Consult: 03/12/2020  Primary Care Provider: Patient, No Pcp Per Primary Cardiologist: New Primary Electrophysiologist:  None    Patient Profile:   Joseph Ray is a 44 y.o. male with a hx of hypertension, tobacco use and obesity who is being seen today for the evaluation of CHF at the request of Dr. Jerolyn Center.  History of Present Illness:   Joseph Ray has not been seen by cardiology in the past. No significant cardiac history. He is a smoker, about 1 pack a week. Admits to marijuana use daily. Occasional alcohol use. He denies cocaine or drug use.  No history of stroke, no heart attack, heart failure, arrhythmia. PTA he was not on any medications. He has not seen primary care provider in about 2 years. Family history positive with CAD (open heart surgery) in father and HTN in mother. He lives by himself and is able to take care of himself ok. Works full-time from home at [employer]. Does not do any regular activity, sedentary lifestyle. He admits he needs to improve his diet.   The patient presented to [facility] ED 03/11/20 for shortness of breath for the last 2 days. SOB was worse at night with orthopnea. Also had a cough the last couple days. He did note a low grade fever. No chest pain.Denied lower leg edema.  He started taking over the counter medications without releif. Patient came into the ER because sob became severe at night. No DOE. No sick contacts.   In the ED BP 173/134, pulse 101, RR 18, mild temperature, 97%. Labs showed potassium 4.2, sodium 140, creatinine 1.24, BUN 11, WBC 6.0, Hgb 13.9, plts 223,000, BNP 291. HS troponin 84>65. D-dimer elevated to 2.07.Covid antigen was  negative but PCR was positive. CXR showed diffuse interstitial prominence with patchy perihilar/basilar opacities. CAT chest negative for PE and it showed cardiomegaly with pulmonary edema. Patient was given IV lasix 40mg  and potassium once and was admitted for further evaluation.    Past Medical History:  Diagnosis Date  . Achilles tendon tear    left  . Snores     Past Surgical History:  Procedure Laterality Date  . ACHILLES TENDON SURGERY Left 03/25/2013   Procedure: LEFT ACHILLES TENDON REPAIR;  Surgeon: [name], MD;  Location: [facility];  Service: Orthopedics;  Laterality: Left;     Home Medications:  Prior to Admission medications   Medication Sig Start Date End Date Taking? Authorizing Provider  guaifenesin (ROBITUSSIN) 100 MG/5ML syrup Take 200 mg by mouth 2 (two) times daily as needed for cough.   Yes [provider]  Pheniramine-PE-APAP (THERAFLU FLU & SORE THROAT) 20-10-650 MG PACK Take 1 packet by mouth daily as needed (cold & flu).   Yes [provider]  fluticasone (FLONASE) 50 MCG/ACT nasal spray Place 1 spray into both nostrils in the morning and at bedtime. Patient not taking: No sig reported 11/29/19   Sudie Grumbling, NP    Inpatient Medications: Scheduled Meds: . vitamin C  500 mg Oral Daily  . enoxaparin (LOVENOX) injection  40 mg Subcutaneous Q24H  . furosemide  40 mg Intravenous Q12H  . losartan  25 mg Oral Daily  . sodium chloride flush  3 mL Intravenous Q12H  . zinc sulfate  220 mg Oral Daily   Continuous Infusions: . sodium chloride     PRN Meds: sodium chloride, acetaminophen, guaiFENesin-dextromethorphan, ondansetron (ZOFRAN) IV, sodium chloride flush  Allergies: Penicillins  Social History:   Social History   Socioeconomic History  . Marital status: Single    Spouse name: Not on file  . Number of children: Not on file  . Years of education: Not on file  . Highest education level: Not on file   Occupational History  . Not on file  Tobacco Use  . Smoking status: Current Some Day Smoker    Packs/day: 0.15  . Smokeless tobacco: Never Used  Vaping Use  . Vaping Use: Never used  Substance and Sexual Activity  . Alcohol use: Yes    Comment: social  . Drug use: No  . Sexual activity: Not on file  Other Topics Concern  . Not on file  Social History Narrative  . Not on file   Social Determinants of Health   Financial Resource Strain: Not on file  Food Insecurity: Not on file  Transportation Needs: Not on file  Physical Activity: Not on file  Stress: Not on file  Social Connections: Not on file  Intimate Partner Violence: Not on file    Family History:   Family History  Problem Relation Age of Onset  . Hypertension Mother   . Hypertension Father      ROS:  Please see the history of present illness.  All other ROS reviewed and negative.     Physical Exam/Data:   Vitals:   03/12/20 0500 03/12/20 0615 03/12/20 0903 03/12/20 0929  BP: (!) 170/151 (!) 149/113 (!) 140/104 (!) 150/118  Pulse: 100 89 89 98  Resp: (!) 22 19 20 20   Temp:    98.6 F (37 C)  TempSrc:    Oral  SpO2: 99% 94% 94% 98%  Weight:      Height:        Intake/Output Summary (Last 24 hours) at 03/12/2020 1408 Last data filed at 03/12/2020 0233 Gross per 24 hour  Intake --  Output 800 ml  Net -800 ml   Last 3 Weights 03/11/2020 03/16/2017 05/23/2016  Weight (lbs) 290 lb 270 lb 270 lb  Weight (kg) 131.543 kg 122.471 kg 122.471 kg     Body mass index is 36.25 kg/m.   VITAL SIGNS:  reviewed  EKG:  The EKG was personally reviewed and demonstrates:  NSR, LVH, TWI V4-V6 (new V5 and V6), T wave flattenting aVL, Qtc 07/23/2016 Telemetry:  Telemetry was personally reviewed and demonstrates:    Relevant CV Studies:  Echo 03/12/20 1. Left ventricular ejection fraction, by estimation, is 30 to 35%. The  left ventricle has moderately decreased function. The left ventricle  demonstrates global  hypokinesis. The left ventricular internal cavity size  was moderately dilated. There is  moderate left ventricular hypertrophy. Left ventricular diastolic  parameters are consistent with Grade I diastolic dysfunction (impaired  relaxation).  2. The mitral valve is normal in structure. Mild mitral valve  regurgitation.  3. The aortic valve is normal in structure. Aortic valve regurgitation is  not visualized. No aortic stenosis is present.  4. The inferior vena cava is normal in size with greater than 50%  respiratory variability, suggesting right atrial pressure of 3 mmHg.   Laboratory Data:  Chemistry Recent Labs  Lab 03/11/20 1155 03/12/20 0454  NA 140 139  K 4.2 3.6  CL  106 104  CO2 26 23  GLUCOSE 101* 100*  BUN 11 15  CREATININE 1.24 1.23  CALCIUM 9.2 9.1  GFRNONAA >60 >60  ANIONGAP 8 12    Recent Labs  Lab 03/12/20 0454  PROT 7.7  ALBUMIN 4.1  AST 54*  ALT 81*  ALKPHOS 42  BILITOT 0.9   Hematology Recent Labs  Lab 03/11/20 1155 03/12/20 0455  WBC 6.0 5.6  RBC 4.53 4.50  HGB 13.9 13.8  HCT 42.7 41.4  MCV 94.3 92.0  MCH 30.7 30.7  MCHC 32.6 33.3  RDW 14.3 14.0  PLT 223 208   Cardiac EnzymesNo results for input(s): TROPONINI in the last 168 hours. No results for input(s): TROPIPOC in the last 168 hours.  BNP Recent Labs  Lab 03/11/20 1614  BNP 291.4*    DDimer  Recent Labs  Lab 03/12/20 0454  DDIMER 2.07*    Radiology/Studies:  DG Chest 2 View  Result Date: 03/11/2020 CLINICAL DATA:  shortness of breath EXAM: CHEST - 2 VIEW COMPARISON:  04/16/2015. FINDINGS: Mild peribronchial cuffing and diffuse interstitial prominence. Patchy perihilar/basilar opacities. No pneumothorax or pleural effusion. Cardiomediastinal silhouette is unchanged. No acute osseous abnormality. IMPRESSION: Interstitial prominence and patchy perihilar/basilar opacities, infection versus edema. Mild bronchitic changes. Electronically Signed   By: Stana Bunting M.D.    On: 03/11/2020 08:48   CT Angio Chest PE W/Cm &/Or Wo Cm  Result Date: 03/11/2020 CLINICAL DATA:  Shortness of breath and chest wall pain for 2 days. Cough. Increased pain with coughing. EXAM: CT ANGIOGRAPHY CHEST WITH CONTRAST TECHNIQUE: Multidetector CT imaging of the chest was performed using the standard protocol during bolus administration of intravenous contrast. Multiplanar CT image reconstructions and MIPs were obtained to evaluate the vascular anatomy. CONTRAST:  OMNIPAQUE IOHEXOL 350 MG/ML SOLN COMPARISON:  Radiograph earlier today. FINDINGS: Cardiovascular: There are no filling defects within the pulmonary arteries to suggest pulmonary embolus. Multi chamber cardiomegaly. Fusiform dilatation of the ascending aorta at 4.3 cm. Cannot assess for dissection given phase of contrast tailored to pulmonary artery evaluation. 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 Small amount pericardial fluid in the superior pericardial recess. Mediastinum/Nodes: Increased number of multiple small mediastinal and hilar lymph nodes, nonenlarged by size criteria. No esophageal wall thickening. No visualized thyroid nodule. Lungs/Pleura: Breathing motion artifact partially limits detailed assessment, particularly the at the bases and apices. Suggestion of subtle septal thickening and mild generalized ground-glass density throughout the lungs. Mild central bronchial thickening. No focal airspace disease or confluent consolidation. Trace right pleural effusion. Minimal left pleural thickening. Upper Abdomen: Suspected hepatic steatosis.  No acute findings. Musculoskeletal: There are no acute or suspicious osseous abnormalities. Review of the MIP images confirms the above findings. IMPRESSION: 1. No pulmonary embolus. 2. Multi chamber cardiomegaly. Mild septal thickening in generalized ground-glass density of the lungs suggest pulmonary edema. Trace right pleural effusion. Overall findings  suggest fluid overload. 3. Mild central bronchial thickening, which may be bronchitic or congestive. 4. Fusiform dilatation of the ascending aorta at 4.3 cm. Recommend annual imaging followup by CTA or MRA. This recommendation follows 2010 ACCF/AHA/AATS/ACR/ASA/SCA/SCAI/SIR/STS/SVM Guidelines for the Diagnosis and Management of Patients with Thoracic Aortic Disease. Circulation. 2010; 121: O756-E332. Aortic aneurysm NOS (ICD10-I71.9) 5. 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 Aortic Atherosclerosis (ICD10-I70.0). Electronically Signed   By: Narda Rutherford M.D.   On: 03/11/2020 17:09   ECHOCARDIOGRAM LIMITED  Result Date: 03/12/2020    ECHOCARDIOGRAM LIMITED REPORT   Patient Name:  Celine Ahr Date of Exam: 03/12/2020 Medical Rec #:  [id number]      Height:       75.0 in Accession #:    [id number]     Weight:       290.0 lb Date of Birth:  1975/06/06      BSA:          2.570 m Patient Age:    44 years       BP:           149/113 mmHg Patient Gender: M              HR:           89 bpm. Exam Location:  Inpatient Procedure: Limited Echo, Cardiac Doppler and Color Doppler Indications:    CHF  History:        Patient has no prior history of Echocardiogram examinations.                 Signs/Symptoms:Shortness of Breath and Pul. edema; Risk                 Factors:Hypertension, Current Smoker and Obesity. COVID +.  Sonographer:    Lavenia Atlas Referring Phys: [id number] VISHAL R PATEL IMPRESSIONS  1. Left ventricular ejection fraction, by estimation, is 30 to 35%. The left ventricle has moderately decreased function. The left ventricle demonstrates global hypokinesis. The left ventricular internal cavity size was moderately dilated. There is moderate left ventricular hypertrophy. Left ventricular diastolic parameters are consistent with Grade I diastolic dysfunction (impaired relaxation).  2. The mitral valve is normal in structure. Mild mitral valve regurgitation.  3. The aortic  valve is normal in structure. Aortic valve regurgitation is not visualized. No aortic stenosis is present.  4. The inferior vena cava is normal in size with greater than 50% respiratory variability, suggesting right atrial pressure of 3 mmHg. FINDINGS  Left Ventricle: Left ventricular ejection fraction, by estimation, is 30 to 35%. The left ventricle has moderately decreased function. The left ventricle demonstrates global hypokinesis. The left ventricular internal cavity size was moderately dilated. There is moderate left ventricular hypertrophy. Left ventricular diastolic parameters are consistent with Grade I diastolic dysfunction (impaired relaxation). Mitral Valve: The mitral valve is normal in structure. Mild mitral valve regurgitation. Tricuspid Valve: The tricuspid valve is normal in structure. Tricuspid valve regurgitation is not demonstrated. Aortic Valve: The aortic valve is normal in structure. Aortic valve regurgitation is not visualized. No aortic stenosis is present. Venous: The inferior vena cava is normal in size with greater than 50% respiratory variability, suggesting right atrial pressure of 3 mmHg. LEFT VENTRICLE PLAX 2D LVIDd:         5.90 cm  Diastology LVIDs:         4.90 cm  LV e' medial:    5.11 cm/s LV PW:         1.60 cm  LV E/e' medial:  11.9 LV IVS:        1.50 cm  LV e' lateral:   5.77 cm/s LVOT diam:     2.50 cm  LV E/e' lateral: 10.5 LVOT Area:     4.91 cm  LEFT ATRIUM         Index LA diam:    5.80 cm 2.26 cm/m   AORTA Ao Root diam: 3.50 cm MITRAL VALVE MV Area (PHT): 3.85 cm    SHUNTS MV Decel Time: 197 msec    Systemic Diam: 2.50 cm MV E  velocity: 60.80 cm/s MV A velocity: 60.00 cm/s MV E/A ratio:  1.01 Donato Schultz MD Electronically signed by Donato Schultz MD Signature Date/Time: 03/12/2020/10:59:06 AM    Final     @   Assessment and Plan:   COVID positive - patient presented with URI symptoms. COVID antigen test was negative but PCR was positive.  CXR showed infection vs edema. D-dimer was elevated but CT chest was negative for PE but showed pulmonary edema and trace right pleural effusion.  - no leukocytosis - CRP slightly elevated - breathing improving  Acute systolic heart failure - pulmonary edema on imaging and BNP mildly elevated to 291 - Given IV lasix 40 in the ED and started on IV lasix BID - I/Os show poor urine output. Need weight today - creatinine stable - patient feels breathing is better today, able to lay flat.  - Echo showed low EF of 30-35% - Given low EF patient will eventually need an ischemic work-up however will have to wait until patient has recovered from acute illness.  - Started on Losartan which can later be transitioned to [facility] - start BB. Will get UDS - can also consider spiro  Elevated troponin - HS troponin 84>65. Trend in flat and not consistent with ACS. Likely demand ischemic in the setting of mild CHF and Covid infection - Aortic atherosclerosis on CT but no mention of CAD - Plan for ischemic eval as above - Will defer IV heparin to MD - Consider aspirin.  - Risk stratify with A1C and Lipid panel  HTN - PTA not on antihypertensives - Pressures elevated - started on Losartan  daily>>can later transition to [facility] as above - BB as above  Ascending aortic dilation - 4.2cm on CT.  - Needs annual imaging with CT or MRI  For questions or updates, please contact [facility] Please consult [email or web address] for contact info under     Signed, Donnavin Vandenbrink David Stall, PA-C  03/12/2020 2:08 PM

## 2020-03-12 NOTE — ED Notes (Signed)
800 cc emptied from pt's urinal.

## 2020-03-12 NOTE — ED Notes (Signed)
Pt's oxygen saturation dropped to 70s.  Upon entering room pt is sleeping. I woke pt and asked pt to take some deep breaths and oxygen saturation increased to 90s. RN notified.

## 2020-03-13 DIAGNOSIS — I5041 Acute combined systolic (congestive) and diastolic (congestive) heart failure: Secondary | ICD-10-CM | POA: Diagnosis not present

## 2020-03-13 DIAGNOSIS — U071 COVID-19: Secondary | ICD-10-CM | POA: Diagnosis not present

## 2020-03-13 LAB — C-REACTIVE PROTEIN: CRP: 0.9 mg/dL (ref <1.0)

## 2020-03-13 LAB — CBC WITH DIFFERENTIAL/PLATELET
Abs Immature Granulocytes: 0.01 10*3/uL (ref 0.00–0.07)
Basophils Absolute: 0 10*3/uL (ref 0.0–0.1)
Basophils Relative: 0 %
Eosinophils Absolute: 0.3 10*3/uL (ref 0.0–0.5)
Eosinophils Relative: 6 %
HCT: 44.5 % (ref 39.0–52.0)
Hemoglobin: 14.8 g/dL (ref 13.0–17.0)
Immature Granulocytes: 0 %
Lymphocytes Relative: 41 %
Lymphs Abs: 1.8 10*3/uL (ref 0.7–4.0)
MCH: 30.2 pg (ref 26.0–34.0)
MCHC: 33.3 g/dL (ref 30.0–36.0)
MCV: 90.8 fL (ref 80.0–100.0)
Monocytes Absolute: 0.7 10*3/uL (ref 0.1–1.0)
Monocytes Relative: 15 %
Neutro Abs: 1.7 10*3/uL (ref 1.7–7.7)
Neutrophils Relative %: 38 %
Platelets: 213 10*3/uL (ref 150–400)
RBC: 4.9 MIL/uL (ref 4.22–5.81)
RDW: 14 % (ref 11.5–15.5)
WBC: 4.6 10*3/uL (ref 4.0–10.5)
nRBC: 0 % (ref 0.0–0.2)

## 2020-03-13 LAB — COMPREHENSIVE METABOLIC PANEL
ALT: 64 U/L — ABNORMAL HIGH (ref 0–44)
AST: 39 U/L (ref 15–41)
Albumin: 4.2 g/dL (ref 3.5–5.0)
Alkaline Phosphatase: 40 U/L (ref 38–126)
Anion gap: 11 (ref 5–15)
BUN: 17 mg/dL (ref 6–20)
CO2: 24 mmol/L (ref 22–32)
Calcium: 9.1 mg/dL (ref 8.9–10.3)
Chloride: 104 mmol/L (ref 98–111)
Creatinine, Ser: 1.11 mg/dL (ref 0.61–1.24)
GFR, Estimated: >60 mL/min (ref >60)
Glucose, Bld: 111 mg/dL — ABNORMAL HIGH (ref 70–99)
Potassium: 3.5 mmol/L (ref 3.5–5.1)
Sodium: 139 mmol/L (ref 135–145)
Total Bilirubin: 0.8 mg/dL (ref 0.3–1.2)
Total Protein: 7.9 g/dL (ref 6.5–8.1)

## 2020-03-13 LAB — D-DIMER, QUANTITATIVE: D-Dimer, Quant: 1.04 ug/mL-FEU — ABNORMAL HIGH (ref 0.00–0.50)

## 2020-03-13 LAB — PHOSPHORUS: Phosphorus: 4.5 mg/dL (ref 2.5–4.6)

## 2020-03-13 LAB — FERRITIN: Ferritin: 446 ng/mL — ABNORMAL HIGH (ref 24–336)

## 2020-03-13 LAB — MAGNESIUM: Magnesium: 2.2 mg/dL (ref 1.7–2.4)

## 2020-03-13 MED ORDER — ASCORBIC ACID 500 MG PO TABS: 500.0000 mg | ORAL_TABLET | Freq: Every day | ORAL | Status: DC

## 2020-03-13 MED ORDER — FUROSEMIDE 40 MG PO TABS: 40.0000 mg | ORAL_TABLET | Freq: Every day | ORAL | 2 refills | Status: DC

## 2020-03-13 MED ORDER — SACUBITRIL-VALSARTAN 24-26 MG PO TABS: 1.0000 | ORAL_TABLET | Freq: Two times a day (BID) | ORAL | 2 refills | Status: DC

## 2020-03-13 MED ORDER — CARVEDILOL 12.5 MG PO TABS: 12.5000 mg | ORAL_TABLET | Freq: Two times a day (BID) | ORAL | 2 refills | Status: DC

## 2020-03-13 MED ORDER — ZINC SULFATE 220 (50 ZN) MG PO CAPS: 220.0000 mg | ORAL_CAPSULE | Freq: Every day | ORAL | Status: DC

## 2020-03-13 MED ORDER — CARVEDILOL 12.5 MG PO TABS: 12.5000 mg | ORAL_TABLET | Freq: Two times a day (BID) | ORAL | Status: DC

## 2020-03-13 MED ORDER — CARVEDILOL 6.25 MG PO TABS
6.2500 mg | ORAL_TABLET | Freq: Two times a day (BID) | ORAL | Status: DC
  Administered 2020-03-13: 6.25 mg via ORAL
  Filled 2020-03-13: qty 1

## 2020-03-13 NOTE — Progress Notes (Incomplete)
Progress Note  Patient Name: Joseph Ray Date of Encounter: 03/13/2020  [facility] Cardiologist: Jodelle Red, MD   Subjective   Spoke to patient on the phone. He said his breathing is much better, pretty much back to baseline. He is able to lay flat. I/Os again do not look complete. Patient he had good urine output and filled up a couple jugs. Creatinine stable. Pressures still elevated.   Inpatient Medications    Scheduled Meds: . vitamin C  500 mg Oral Daily  . carvedilol  3.125 mg Oral BID WC  . enoxaparin (LOVENOX) injection  40 mg Subcutaneous Q24H  . furosemide  40 mg Oral Daily  . sacubitril-valsartan  1 tablet Oral BID  . sodium chloride flush  3 mL Intravenous Q12H  . zinc sulfate  220 mg Oral Daily   Continuous Infusions: . sodium chloride     PRN Meds: sodium chloride, acetaminophen, guaiFENesin-dextromethorphan, ondansetron (ZOFRAN) IV, sodium chloride flush   Vital Signs    Vitals:   03/12/20 1410 03/12/20 1807 03/12/20 2208 03/13/20 0636  BP: (!) 163/113 (!) 153/116 (!) 158/123 (!) 151/119  Pulse: 87 88 85 89  Resp: 18 18 20 20   Temp: (!) 97.5 F (36.4 C) 98 F (36.7 C) 98.2 F (36.8 C) 98.3 F (36.8 C)  TempSrc: Oral Oral Oral Oral  SpO2: 96% 98% 98% 95%  Weight:      Height:        Intake/Output Summary (Last 24 hours) at 03/13/2020 0747 Last data filed at 03/12/2020 2100 Gross per 24 hour  Intake -  Output 300 ml  Net -300 ml   Last 3 Weights 03/11/2020 03/16/2017 05/23/2016  Weight (lbs) 290 lb 270 lb 270 lb  Weight (kg) 131.543 kg 122.471 kg 122.471 kg      Telemetry    N/A - Personally Reviewed  ECG    No New - Personally Reviewed  Physical Exam   GEN: No acute distress.   Neck: No JVD Cardiac: RRR, no murmurs, rubs, or gallops.  Respiratory: Clear to auscultation bilaterally. GI: Soft, nontender, non-distended  MS: No edema; No deformity. Neuro:  Nonfocal  Psych: Normal affect   Labs    High  Sensitivity Troponin:   Recent Labs  Lab 03/11/20 1155 03/11/20 1614  TROPONINIHS 84* 65*      Chemistry Recent Labs  Lab 03/11/20 1155 03/12/20 0454 03/13/20 0451  NA 140 139 139  K 4.2 3.6 3.5  CL 106 104 104  CO2 26 23 24   GLUCOSE 101* 100* 111*  BUN 11 15 17   CREATININE 1.24 1.23 1.11  CALCIUM 9.2 9.1 9.1  PROT  --  7.7 7.9  ALBUMIN  --  4.1 4.2  AST  --  54* 39  ALT  --  81* 64*  ALKPHOS  --  42 40  BILITOT  --  0.9 0.8  GFRNONAA >60 >60 >60  ANIONGAP 8 12 11      Hematology Recent Labs  Lab 03/11/20 1155 03/12/20 0455 03/13/20 0451  WBC 6.0 5.6 4.6  RBC 4.53 4.50 4.90  HGB 13.9 13.8 14.8  HCT 42.7 41.4 44.5  MCV 94.3 92.0 90.8  MCH 30.7 30.7 30.2  MCHC 32.6 33.3 33.3  RDW 14.3 14.0 14.0  PLT 223 208 213    BNP Recent Labs  Lab 03/11/20 1614  BNP 291.4*     DDimer  Recent Labs  Lab 03/12/20 0454 03/13/20 0451  DDIMER 2.07* 1.04*  Radiology    DG Chest 2 View  Result Date: 03/11/2020 CLINICAL DATA:  shortness of breath EXAM: CHEST - 2 VIEW COMPARISON:  04/16/2015. FINDINGS: Mild peribronchial cuffing and diffuse interstitial prominence. Patchy perihilar/basilar opacities. No pneumothorax or pleural effusion. Cardiomediastinal silhouette is unchanged. No acute osseous abnormality. IMPRESSION: Interstitial prominence and patchy perihilar/basilar opacities, infection versus edema. Mild bronchitic changes. Electronically Signed   By: Stana Bunting M.D.   On: 03/11/2020 08:48   CT Angio Chest PE W/Cm &/Or Wo Cm  Result Date: 03/11/2020 CLINICAL DATA:  Shortness of breath and chest wall pain for 2 days. Cough. Increased pain with coughing. EXAM: CT ANGIOGRAPHY CHEST WITH CONTRAST TECHNIQUE: Multidetector CT imaging of the chest was performed using the standard protocol during bolus administration of intravenous contrast. Multiplanar CT image reconstructions and MIPs were obtained to evaluate the vascular anatomy. CONTRAST:   OMNIPAQUE IOHEXOL 350 MG/ML SOLN COMPARISON:  Radiograph earlier today. FINDINGS: Cardiovascular: There are no filling defects within the pulmonary arteries to suggest pulmonary embolus. Multi chamber cardiomegaly. Fusiform dilatation of the ascending aorta at 4.3 cm. Cannot assess for dissection given phase of contrast tailored to pulmonary artery evaluation. 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 Small amount pericardial fluid in the superior pericardial recess. Mediastinum/Nodes: Increased number of multiple small mediastinal and hilar lymph nodes, nonenlarged by size criteria. No esophageal wall thickening. No visualized thyroid nodule. Lungs/Pleura: Breathing motion artifact partially limits detailed assessment, particularly the at the bases and apices. Suggestion of subtle septal thickening and mild generalized ground-glass density throughout the lungs. Mild central bronchial thickening. No focal airspace disease or confluent consolidation. Trace right pleural effusion. Minimal left pleural thickening. Upper Abdomen: Suspected hepatic steatosis.  No acute findings. Musculoskeletal: There are no acute or suspicious osseous abnormalities. Review of the MIP images confirms the above findings. IMPRESSION: 1. No pulmonary embolus. 2. Multi chamber cardiomegaly. Mild septal thickening in generalized ground-glass density of the lungs suggest pulmonary edema. Trace right pleural effusion. Overall findings suggest fluid overload. 3. Mild central bronchial thickening, which may be bronchitic or congestive. 4. Fusiform dilatation of the ascending aorta at 4.3 cm. Recommend annual imaging followup by CTA or MRA. This recommendation follows 2010 ACCF/AHA/AATS/ACR/ASA/SCA/SCAI/SIR/STS/SVM Guidelines for the Diagnosis and Management of Patients with Thoracic Aortic Disease. Circulation. 2010; 121: F818-E993. Aortic aneurysm NOS (ICD10-I71.9) 5. Aberrant right subclavian artery courses posterior  to the esophagus. Aortic Atherosclerosis (ICD10-I70.0). Electronically Signed   By: Narda Rutherford M.D.   On: 03/11/2020 17:09   ECHOCARDIOGRAM LIMITED  Result Date: 03/12/2020    ECHOCARDIOGRAM LIMITED REPORT   Patient Name:   Joseph Ray Date of Exam: 03/12/2020 Medical Rec #:  [id number]      Height:       75.0 in Accession #:    [id number]     Weight:       290.0 lb Date of Birth:  [birth year]      BSA:          2.570 m Patient Age:    44 years       BP:           149/113 mmHg Patient Gender: M              HR:           89 bpm. Exam Location:  Inpatient Procedure: Limited Echo, Cardiac Doppler and Color Doppler Indications:    CHF  History:        Patient has no  prior history of Echocardiogram examinations.                 Signs/Symptoms:Shortness of Breath and Pul. edema; Risk                 Factors:Hypertension, Current Smoker and Obesity. COVID +.  Sonographer:    Lavenia Atlas Referring Phys: [id number] VISHAL R PATEL IMPRESSIONS  1. Left ventricular ejection fraction, by estimation, is 30 to 35%. The left ventricle has moderately decreased function. The left ventricle demonstrates global hypokinesis. The left ventricular internal cavity size was moderately dilated. There is moderate left ventricular hypertrophy. Left ventricular diastolic parameters are consistent with Grade I diastolic dysfunction (impaired relaxation).  2. The mitral valve is normal in structure. Mild mitral valve regurgitation.  3. The aortic valve is normal in structure. Aortic valve regurgitation is not visualized. No aortic stenosis is present.  4. The inferior vena cava is normal in size with greater than 50% respiratory variability, suggesting right atrial pressure of 3 mmHg. FINDINGS  Left Ventricle: Left ventricular ejection fraction, by estimation, is 30 to 35%. The left ventricle has moderately decreased function. The left ventricle demonstrates global hypokinesis. The left ventricular internal cavity size was  moderately dilated. There is moderate left ventricular hypertrophy. Left ventricular diastolic parameters are consistent with Grade I diastolic dysfunction (impaired relaxation). Mitral Valve: The mitral valve is normal in structure. Mild mitral valve regurgitation. Tricuspid Valve: The tricuspid valve is normal in structure. Tricuspid valve regurgitation is not demonstrated. Aortic Valve: The aortic valve is normal in structure. Aortic valve regurgitation is not visualized. No aortic stenosis is present. Venous: The inferior vena cava is normal in size with greater than 50% respiratory variability, suggesting right atrial pressure of 3 mmHg. LEFT VENTRICLE PLAX 2D LVIDd:         5.90 cm  Diastology LVIDs:         4.90 cm  LV e' medial:    5.11 cm/s LV PW:         1.60 cm  LV E/e' medial:  11.9 LV IVS:        1.50 cm  LV e' lateral:   5.77 cm/s LVOT diam:     2.50 cm  LV E/e' lateral: 10.5 LVOT Area:     4.91 cm  LEFT ATRIUM         Index LA diam:    5.80 cm 2.26 cm/m   AORTA Ao Root diam: 3.50 cm MITRAL VALVE MV Area (PHT): 3.85 cm    SHUNTS MV Decel Time: 197 msec    Systemic Diam: 2.50 cm MV E velocity: 60.80 cm/s MV A velocity: 60.00 cm/s MV E/A ratio:  1.01 Donato Schultz MD Electronically signed by Donato Schultz MD Signature Date/Time: 03/12/2020/10:59:06 AM    Final     Cardiac Studies   Echo 03/12/20 1. Left ventricular ejection fraction, by estimation, is 30 to 35%. The  left ventricle has moderately decreased function. The left ventricle  demonstrates global hypokinesis. The left ventricular internal cavity size  was moderately dilated. There is  moderate left ventricular hypertrophy. Left ventricular diastolic  parameters are consistent with Grade I diastolic dysfunction (impaired  relaxation).  2. The mitral valve is normal in structure. Mild mitral valve  regurgitation.  3. The aortic valve is normal in structure. Aortic valve regurgitation is  not visualized. No aortic stenosis is  present.  4. The inferior vena cava is normal in size with greater than 50%  respiratory variability,  suggesting right atrial pressure of 3 mmHg.   Patient Profile     44 y.o. male  with a hx of hypertension, tobacco use and obesity who is being seen today for the evaluation of CHF. Patient is Covid positive.  Assessment & Plan    COVID positive - patient presented with URI symptoms. COVID antigen test was negative but PCR was positive. CXR showed infection vs edema. D-dimer was elevated but CT chest was negative for PE but showed pulmonary edema and trace right pleural effusion.  - no leukocytosis - CRP slightly elevated - breathing improving  Acute systolic heart failure - pulmonary edema on imaging and BNP mildly elevated to 291 - Given IV lasix 40 in the ED and started on IV lasix BID - Again I/Os show low urine output. Unsure of the accuracy. Need weight today - creatinine stable - Echo showed low EF of 30-35%. Given low EF patient will eventually need an ischemic work-up however will have to wait until patient has recovered from acute illness.  - Started on Entresto  - started on Coreg. Will increase this.  - can also consider spiro since there is bp room.   Elevated troponin - HS troponin 84>65. Trend in flat and not consistent with ACS. Likely demand ischemic in the setting of mild CHF and Covid infection - Aortic atherosclerosis on CT but no mention of CAD - Plan for ischemic eval as above - No plan for IV heparin  - Plan to consider aspirin and statin as OP  HTN - PTA not on antihypertensives - Pressures elevated - started on Entresto and coreg. Will increase coreg as above.   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  - Needs annual imaging with CT or MRI  For questions or updates, please contact [facility] Please consult [email or web address] for contact info under        Signed, Dymon Summerhill David Stall, PA-C  03/13/2020, 7:47 AM

## 2020-03-13 NOTE — Discharge Summary (Signed)
Physician Discharge Summary  Joseph Ray JYN:[id number] DOB: Sep 11, 1975 DOA: 03/11/2020  PCP: Patient, No Pcp Per  Admit date: 03/11/2020 Discharge date: 03/13/2020  Admitted From: Home Disposition: Home Recommendations for Outpatient Follow-up:  1. Follow up with PCP in 1-2 weeks 2. Please obtain BMP/CBC in one week 3. Please follow up with cardiology for further cardiac work-up  Home Health: None Equipment/Devices: None  Discharge Condition: Stable and improved CODE STATUS full code Diet recommendation: Cardiac Brief/Interim Summary:44 y.o.malewith medical history significant forhypertension, obesity who presents to the ED for evaluation of shortness of breath.  Patient states 3-4 years ago he developed cough productive of clear/white sputum. He felt as if he was coming down with an upper respiratory infection therefore was try to support himself with TheraFlu, fluids, and soup. Over the last 2 days he developed new onset of shortness of breath, worse when lying flat. He has had associated paroxysmal nocturnal dyspnea. He has occasional sensation of palpitations. He denies any significant dyspnea on exertion. He says he had seen swelling around his ankle several months ago which resolved on its own and has not seen any recent lower extremity edema. He says he does snore at night and is planning on undergoing a sleep study at some point.  He does report tobacco use, 1 pack lasting up to 1 week. He reports occasional alcohol use in a social setting. He admits to marijuana use. He denies any cocaine or IV drug use. He reports a history of high blood pressure in both of his parents.   Discharge Diagnoses:  Principal Problem:   Acute CHF (congestive heart failure) (HCC) Active Problems:   Essential hypertension   Ascending aorta dilatation (HCC)   COVID-19 virus detected     #NEW ONSET SYSTOLIC  CHF-admitted woth doe pnd and lower extremity edema Chest xray  pulmonary edema He was treated with Lasix, beta-blocker, Entresto was started.  Seen by cardiology.  He will need to follow-up with them as an outpatient. Echo revealed low EF 30 to 35%. His troponin was mildly elevated 84-65 which was thought to be due to demand ischemia.  #covid positive on 2 L o2.he was stable offered supportive treatment.   #Polysubstance abuse uds positive for THC.  # HTN  Continue Lasix Entresto and beta-blocker.  #Possible sleep apnea needs outpatient w/u  #Aortic dilatation- yearly f/u.  Ascending aortic dilatation 4.2 cm on CT scan.  #Hyperlipidemia with LDL of 137 triglycerides 152.i called in Lipitor 20 mg to [facility].    Estimated body mass index is 36.25 kg/m as calculated from the following:   Height as of this encounter:  (1.905 m).   Weight as of this encounter: 131.5 kg.  Discharge Instructions  Discharge Instructions    Call MD for:  difficulty breathing, headache or visual disturbances   Complete by: As directed    Call MD for:  persistant dizziness or light-headedness   Complete by: As directed    Call MD for:  persistant nausea and vomiting   Complete by: As directed    Diet - low sodium heart healthy   Complete by: As directed    Heart Failure patients record your daily weight using the same scale at the same time of day   Complete by: As directed    Increase activity slowly   Complete by: As directed    STOP any activity that causes chest pain, shortness of breath, dizziness, sweating, or exessive weakness   Complete by: As  directed      Allergies as of 03/13/2020      Reactions   Penicillins Rash   Has patient had a PCN reaction causing immediate rash, facial/tongue/throat swelling, SOB or lightheadedness with hypotension: NoNo Has patient had a PCN reaction causing severe rash involving mucus membranes or skin necrosis: NoNo Has patient had a PCN reaction that required hospitalization NoNo Has patient had a PCN  reaction occurring within the last 10 years: NoNo If all of the above answers are "NO", then may proceed with Cephalosporin       Medication List    STOP taking these medications   fluticasone 50 MCG/ACT nasal spray Commonly known as: FLONASE   guaifenesin 100 MG/5ML syrup Commonly known as: ROBITUSSIN     TAKE these medications   ascorbic acid 500 MG tablet Commonly known as: VITAMIN C Take 1 tablet (500 mg total) by mouth daily. Start taking on: March 14, 2020   carvedilol 12.5 MG tablet Commonly known as: COREG Take 1 tablet (12.5 mg total) by mouth 2 (two) times daily with a meal.   furosemide 40 MG tablet Commonly known as: LASIX Take 1 tablet (40 mg total) by mouth daily. Start taking on: March 14, 2020   sacubitril-valsartan 24-26 MG Commonly known as: ENTRESTO Take 1 tablet by mouth 2 (two) times daily.   Theraflu Flu & Sore Throat 20-10-650 MG Pack Generic drug: Pheniramine-PE-APAP Take 1 packet by mouth daily as needed (cold & flu).   zinc sulfate 220 (50 Zn) MG capsule Take 1 capsule (220 mg total) by mouth daily. Start taking on: March 14, 2020       Follow-up Information    Jodelle Red, MD Follow up.   Specialty: Cardiology Contact information: [address] [place] [address] [phone]              Allergies  Allergen Reactions  . Penicillins Rash    Has patient had a PCN reaction causing immediate rash, facial/tongue/throat swelling, SOB or lightheadedness with hypotension: NoNo Has patient had a PCN reaction causing severe rash involving mucus membranes or skin necrosis: YesNo Has patient had a PCN reaction that required hospitalization YesNo Has patient had a PCN reaction occurring within the last 10 years: Theophilus Kinds If all of the above answers are "NO", then may proceed with Cephalosporin     Consultations: cardiology   Procedures/Studies: DG Chest 2 View  Result Date: 03/11/2020 CLINICAL DATA:   shortness of breath EXAM: CHEST - 2 VIEW COMPARISON:  04/16/2015. FINDINGS: Mild peribronchial cuffing and diffuse interstitial prominence. Patchy perihilar/basilar opacities. No pneumothorax or pleural effusion. Cardiomediastinal silhouette is unchanged. No acute osseous abnormality. IMPRESSION: Interstitial prominence and patchy perihilar/basilar opacities, infection versus edema. Mild bronchitic changes. Electronically Signed   By: Stana Bunting M.D.   On: 03/11/2020 08:48   CT Angio Chest PE W/Cm &/Or Wo Cm  Result Date: 03/11/2020 CLINICAL DATA:  Shortness of breath and chest wall pain for 2 days. Cough. Increased pain with coughing. EXAM: CT ANGIOGRAPHY CHEST WITH CONTRAST TECHNIQUE: Multidetector CT imaging of the chest was performed using the standard protocol during bolus administration of intravenous contrast. Multiplanar CT image reconstructions and MIPs were obtained to evaluate the vascular anatomy. CONTRAST:  OMNIPAQUE IOHEXOL 350 MG/ML SOLN COMPARISON:  Radiograph earlier today. FINDINGS: Cardiovascular: There are no filling defects within the pulmonary arteries to suggest pulmonary embolus. Multi chamber cardiomegaly. Fusiform dilatation of the ascending aorta at 4.3 cm. Cannot assess for dissection given  phase of contrast tailored to pulmonary artery evaluation. 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 Small amount pericardial fluid in the superior pericardial recess. Mediastinum/Nodes: Increased number of multiple small mediastinal and hilar lymph nodes, nonenlarged by size criteria. No esophageal wall thickening. No visualized thyroid nodule. Lungs/Pleura: Breathing motion artifact partially limits detailed assessment, particularly the at the bases and apices. Suggestion of subtle septal thickening and mild generalized ground-glass density throughout the lungs. Mild central bronchial thickening. No focal airspace disease or confluent consolidation.  Trace right pleural effusion. Minimal left pleural thickening. Upper Abdomen: Suspected hepatic steatosis.  No acute findings. Musculoskeletal: There are no acute or suspicious osseous abnormalities. Review of the MIP images confirms the above findings. IMPRESSION: 1. No pulmonary embolus. 2. Multi chamber cardiomegaly. Mild septal thickening in generalized ground-glass density of the lungs suggest pulmonary edema. Trace right pleural effusion. Overall findings suggest fluid overload. 3. Mild central bronchial thickening, which may be bronchitic or congestive. 4. Fusiform dilatation of the ascending aorta at 4.3 cm. Recommend annual imaging followup by CTA or MRA. This recommendation follows 2010 ACCF/AHA/AATS/ACR/ASA/SCA/SCAI/SIR/STS/SVM Guidelines for the Diagnosis and Management of Patients with Thoracic Aortic Disease. Circulation. 2010; 121: D220-U542. Aortic aneurysm NOS (ICD10-I71.9) 5. 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 Aortic Atherosclerosis (ICD10-I70.0). Electronically Signed   By: Narda Rutherford M.D.   On: 03/11/2020 17:09   ECHOCARDIOGRAM LIMITED  Result Date: 03/12/2020    ECHOCARDIOGRAM LIMITED REPORT   Patient Name:   Joseph Ray Date of Exam: 03/12/2020 Medical Rec #:  [id number]      Height:       75.0 in Accession #:    [id number]     Weight:       290.0 lb Date of Birth:  1975-07-31      BSA:          2.570 m Patient Age:    44 years       BP:           149/113 mmHg Patient Gender: M              HR:           89 bpm. Exam Location:  Inpatient Procedure: Limited Echo, Cardiac Doppler and Color Doppler Indications:    CHF  History:        Patient has no prior history of Echocardiogram examinations.                 Signs/Symptoms:Shortness of Breath and Pul. edema; Risk                 Factors:Hypertension, Current Smoker and Obesity. COVID +.  Sonographer:    Lavenia Atlas Referring Phys: [id number] VISHAL R PATEL IMPRESSIONS  1. Left ventricular  ejection fraction, by estimation, is 30 to 35%. The left ventricle has moderately decreased function. The left ventricle demonstrates global hypokinesis. The left ventricular internal cavity size was moderately dilated. There is moderate left ventricular hypertrophy. Left ventricular diastolic parameters are consistent with Grade I diastolic dysfunction (impaired relaxation).  2. The mitral valve is normal in structure. Mild mitral valve regurgitation.  3. The aortic valve is normal in structure. Aortic valve regurgitation is not visualized. No aortic stenosis is present.  4. The inferior vena cava is normal in size with greater than 50% respiratory variability, suggesting right atrial pressure of 3 mmHg. FINDINGS  Left Ventricle: Left ventricular ejection fraction, by estimation, is 30 to 35%. The left ventricle has  moderately decreased function. The left ventricle demonstrates global hypokinesis. The left ventricular internal cavity size was moderately dilated. There is moderate left ventricular hypertrophy. Left ventricular diastolic parameters are consistent with Grade I diastolic dysfunction (impaired relaxation). Mitral Valve: The mitral valve is normal in structure. Mild mitral valve regurgitation. Tricuspid Valve: The tricuspid valve is normal in structure. Tricuspid valve regurgitation is not demonstrated. Aortic Valve: The aortic valve is normal in structure. Aortic valve regurgitation is not visualized. No aortic stenosis is present. Venous: The inferior vena cava is normal in size with greater than 50% respiratory variability, suggesting right atrial pressure of 3 mmHg. LEFT VENTRICLE PLAX 2D LVIDd:         5.90 cm  Diastology LVIDs:         4.90 cm  LV e' medial:    5.11 cm/s LV PW:         1.60 cm  LV E/e' medial:  11.9 LV IVS:        1.50 cm  LV e' lateral:   5.77 cm/s LVOT diam:     2.50 cm  LV E/e' lateral: 10.5 LVOT Area:     4.91 cm  LEFT ATRIUM         Index LA diam:    5.80 cm 2.26 cm/m    AORTA Ao Root diam: 3.50 cm MITRAL VALVE MV Area (PHT): 3.85 cm    SHUNTS MV Decel Time: 197 msec    Systemic Diam: 2.50 cm MV E velocity: 60.80 cm/s MV A velocity: 60.00 cm/s MV E/A ratio:  1.01 Donato Schultz MD Electronically signed by Donato Schultz MD Signature Date/Time: 03/12/2020/10:59:06 AM    Final    (Echo, Carotid, EGD, Colonoscopy, ERCP)    Subjective:  Patient resting in bed in no acute distress anxious to go home Discharge Exam: Vitals:   03/12/20 2208 03/13/20 0636  BP: (!) 158/123 (!) 151/119  Pulse: 85 89  Resp: 20 20  Temp: 98.2 F (36.8 C) 98.3 F (36.8 C)  SpO2: 98% 95%   Vitals:   03/12/20 1410 03/12/20 1807 03/12/20 2208 03/13/20 0636  BP: (!) 163/113 (!) 153/116 (!) 158/123 (!) 151/119  Pulse: 87 88 85 89  Resp: 18 18 20 20   Temp: (!) 97.5 F (36.4 C) 98 F (36.7 C) 98.2 F (36.8 C) 98.3 F (36.8 C)  TempSrc: Oral Oral Oral Oral  SpO2: 96% 98% 98% 95%  Weight:      Height:        General: Pt is alert, awake, not in acute distress Cardiovascular: RRR, S1/S2 +, no rubs, no gallops Respiratory: CTA bilaterally, no wheezing, no rhonchi Abdominal: Soft, NT, ND, bowel sounds + Extremities: no edema, no cyanosis    The results of significant diagnostics from this hospitalization (including imaging, microbiology, ancillary and laboratory) are listed below for reference.     Microbiology: Recent Results (from the past 240 hour(s))  Resp Panel by RT-PCR (Flu A&B, Covid) Nasopharyngeal Swab     Status: Abnormal   Collection Time: 03/11/20  7:26 PM   Specimen: Nasopharyngeal Swab; Nasopharyngeal(NP) swabs in vial transport medium  Result Value Ref Range Status   SARS Coronavirus 2 by RT PCR POSITIVE (A) NEGATIVE Final    Comment: RESULT CALLED TO, READ BACK BY AND VERIFIED WITH: LOWDERMILK J. 12.29.21 @ 2123 BY MECIAL J.  (NOTE) SARS-CoV-2 target nucleic acids are DETECTED.  The SARS-CoV-2 RNA is generally detectable in upper respiratory specimens  during the acute phase of infection.  Positive results are indicative of the presence of the identified virus, but do not rule out bacterial infection or co-infection with other pathogens not detected by the test. Clinical correlation with patient history and other diagnostic information is necessary to determine patient infection status. The expected result is Negative.  Fact Sheet for Patients: BloggerCourse.com  Fact Sheet for Healthcare Providers: SeriousBroker.it  This test is not yet approved or cleared by the [place] FDA and  has been authorized for detection and/or diagnosis of SARS-CoV-2 by FDA under an Emergency Use Authorization (EUA).  This EUA will remain in effect (meaning this t est can be used) for the duration of  the COVID-19 declaration under Section 564(b)(1) of the Act, 21 U.S.C. section 360bbb-3(b)(1), unless the authorization is terminated or revoked sooner.     Influenza A by PCR NEGATIVE NEGATIVE Final   Influenza B by PCR NEGATIVE NEGATIVE Final    Comment: (NOTE) The Xpert Xpress SARS-CoV-2/FLU/RSV plus assay is intended as an aid in the diagnosis of influenza from Nasopharyngeal swab specimens and should not be used as a sole basis for treatment. Nasal washings and aspirates are unacceptable for Xpert Xpress SARS-CoV-2/FLU/RSV testing.  Fact Sheet for Patients: BloggerCourse.com  Fact Sheet for Healthcare Providers: SeriousBroker.it  This test is not yet approved or cleared by the [place] FDA and has been authorized for detection and/or diagnosis of SARS-CoV-2 by FDA under an Emergency Use Authorization (EUA). This EUA will remain in effect (meaning this test can be used) for the duration of the COVID-19 declaration under Section 564(b)(1) of the Act, 21 U.S.C. section 360bbb-3(b)(1), unless the authorization is terminated  or revoked.  Performed at [facility], [address], [place], [place] [address]      Labs: BNP (last 3 results) Recent Labs    03/11/20 1614  BNP 291.4*   Basic Metabolic Panel: Recent Labs  Lab 03/11/20 1155 03/12/20 0454 03/12/20 0455 03/13/20 0451  NA 140 139  --  139  K 4.2 3.6  --  3.5  CL 106 104  --  104  CO2 26 23  --  24  GLUCOSE 101* 100*  --  111*  BUN 11 15  --  17  CREATININE 1.24 1.23  --  1.11  CALCIUM 9.2 9.1  --  9.1  MG  --   --  2.1 2.2  PHOS  --  4.3  --  4.5   Liver Function Tests: Recent Labs  Lab 03/12/20 0454 03/13/20 0451  AST 54* 39  ALT 81* 64*  ALKPHOS 42 40  BILITOT 0.9 0.8  PROT 7.7 7.9  ALBUMIN 4.1 4.2   No results for input(s): LIPASE, AMYLASE in the last 168 hours. No results for input(s): AMMONIA in the last 168 hours. CBC: Recent Labs  Lab 03/11/20 1155 03/12/20 0455 03/13/20 0451  WBC 6.0 5.6 4.6  NEUTROABS  --  2.8 1.7  HGB 13.9 13.8 14.8  HCT 42.7 41.4 44.5  MCV 94.3 92.0 90.8  PLT 223 208 213   Cardiac Enzymes: No results for input(s): CKTOTAL, CKMB, CKMBINDEX, TROPONINI in the last 168 hours. BNP: Invalid input(s): POCBNP CBG: No results for input(s): GLUCAP in the last 168 hours. D-Dimer Recent Labs    03/12/20 0454 03/13/20 0451  DDIMER 2.07* 1.04*   Hgb A1c Recent Labs    03/12/20 1505  HGBA1C 5.9*   Lipid Profile Recent Labs    03/12/20 0455  CHOL 193  HDL 26*  LDLCALC 137*  TRIG 152*  CHOLHDL 7.4   Thyroid function studies No results for input(s): TSH, T4TOTAL, T3FREE, THYROIDAB in the last 72 hours.  Invalid input(s): FREET3 Anemia work up Recent Labs    03/12/20 0454 03/13/20 0451  FERRITIN 502* 446*   Urinalysis No results found for: COLORURINE, APPEARANCEUR, LABSPEC, PHURINE, GLUCOSEU, HGBUR, BILIRUBINUR, KETONESUR, PROTEINUR, UROBILINOGEN, NITRITE, LEUKOCYTESUR Sepsis Labs Invalid input(s): PROCALCITONIN,  WBC,   LACTICIDVEN Microbiology Recent Results (from the past 240 hour(s))  Resp Panel by RT-PCR (Flu A&B, Covid) Nasopharyngeal Swab     Status: Abnormal   Collection Time: 03/11/20  7:26 PM   Specimen: Nasopharyngeal Swab; Nasopharyngeal(NP) swabs in vial transport medium  Result Value Ref Range Status   SARS Coronavirus 2 by RT PCR POSITIVE (A) NEGATIVE Final    Comment: RESULT CALLED TO, READ BACK BY AND VERIFIED WITH: LOWDERMILK J. 12.29.21 @ 2123 BY MECIAL J.  (NOTE) SARS-CoV-2 target nucleic acids are DETECTED.  The SARS-CoV-2 RNA is generally detectable in upper respiratory specimens during the acute phase of infection. Positive results are indicative of the presence of the identified virus, but do not rule out bacterial infection or co-infection with other pathogens not detected by the test. Clinical correlation with patient history and other diagnostic information is necessary to determine patient infection status. The expected result is Negative.  Fact Sheet for Patients: BloggerCourse.com  Fact Sheet for Healthcare Providers: SeriousBroker.it  This test is not yet approved or cleared by the [place] FDA and  has been authorized for detection and/or diagnosis of SARS-CoV-2 by FDA under an Emergency Use Authorization (EUA).  This EUA will remain in effect (meaning this t est can be used) for the duration of  the COVID-19 declaration under Section 564(b)(1) of the Act, 21 U.S.C. section 360bbb-3(b)(1), unless the authorization is terminated or revoked sooner.     Influenza A by PCR NEGATIVE NEGATIVE Final   Influenza B by PCR NEGATIVE NEGATIVE Final    Comment: (NOTE) The Xpert Xpress SARS-CoV-2/FLU/RSV plus assay is intended as an aid in the diagnosis of influenza from Nasopharyngeal swab specimens and should not be used as a sole basis for treatment. Nasal washings and aspirates are unacceptable for Xpert Xpress  SARS-CoV-2/FLU/RSV testing.  Fact Sheet for Patients: BloggerCourse.com  Fact Sheet for Healthcare Providers: SeriousBroker.it  This test is not yet approved or cleared by the [place] FDA and has been authorized for detection and/or diagnosis of SARS-CoV-2 by FDA under an Emergency Use Authorization (EUA). This EUA will remain in effect (meaning this test can be used) for the duration of the COVID-19 declaration under Section 564(b)(1) of the Act, 21 U.S.C. section 360bbb-3(b)(1), unless the authorization is terminated or revoked.  Performed at [facility], [address], [place], [place] [address]      Time coordinating discharge: 39 minutes  SIGNED:   Alwyn Ren, MD  [facility] 03/13/2020, 4:21 PM

## 2020-03-13 NOTE — Discharge Instructions (Signed)
Do the following things EVERY DAY:  1) Weigh yourself EVERY morning after you go to the bathroom but before you eat or drink anything. Write this number down in a weight log/diary. If you gain 3 pounds overnight or 5 pounds in a week, take a fluid pill. If your weight continues to go up after 3 daily doses of fluid pill, call the office.  2) Take your medicines as prescribed. If you have concerns about your medications, please call us before you stop taking them.   3) Eat low salt foods--Limit salt (sodium) to 2000 mg per day. This will help prevent your body from holding onto fluid. Read food labels as many processed foods have a lot of sodium, especially canned goods and prepackaged meats. If you would like some assistance choosing low sodium foods, we would be happy to set you up with a nutritionist.  4) Stay as active as you can everyday. Staying active will give you more energy and make your muscles stronger. Start with 5 minutes at a time and work your way up to 30 minutes a day. Break up your activities--do some in the morning and some in the afternoon. Start with 3 days per week and work your way up to 5 days as you can.  If you have chest pain, feel short of breath, dizzy, or lightheaded, STOP. If you don't feel better after a short rest, call 911. If you do feel better, call the office to let us know you have symptoms with exercise.  5) Limit all fluids for the day to less than 2 liters. Fluid includes all drinks, coffee, juice, ice chips, soup, jello, and all other liquids.

## 2020-03-13 NOTE — Progress Notes (Signed)
Brief cardiology progress note:  I called and spoke to Joseph Ray this AM. He is doing well, breathing is excellent, no swelling. He tolerated both carvedilol and entresto well. We will uptitrate carvedilol today, but from the cardiac perspective he is ready for discharge.  [facility] will sign off.   Medication Recommendations:   carvedilol 12.5 mg BID entresto 24-26 mg BID Furosemide 40 mg daily as needed for weight gain (instructions in discharge instructions, 3 lbs overnight or 5 lbs from baseline)  Other recommendations (labs, testing, etc):  BMET at follow up Follow up as an outpatient:  We will arrange for him to see me in clinic for follow up in several weeks.

## 2020-03-18 ENCOUNTER — Encounter: Payer: Self-pay | Admitting: Cardiology

## 2020-03-18 ENCOUNTER — Telehealth (INDEPENDENT_AMBULATORY_CARE_PROVIDER_SITE_OTHER): Payer: HRSA Program | Admitting: Cardiology

## 2020-03-18 DIAGNOSIS — I429 Cardiomyopathy, unspecified: Secondary | ICD-10-CM | POA: Diagnosis not present

## 2020-03-18 DIAGNOSIS — I10 Essential (primary) hypertension: Secondary | ICD-10-CM | POA: Diagnosis not present

## 2020-03-18 DIAGNOSIS — U071 COVID-19: Secondary | ICD-10-CM

## 2020-03-18 DIAGNOSIS — I5041 Acute combined systolic (congestive) and diastolic (congestive) heart failure: Secondary | ICD-10-CM | POA: Diagnosis not present

## 2020-03-18 DIAGNOSIS — Z7189 Other specified counseling: Secondary | ICD-10-CM

## 2020-03-18 NOTE — Progress Notes (Signed)
Virtual Visit via Telephone Note   This visit type was conducted due to national recommendations for restrictions regarding the COVID-19 Pandemic (e.g. social distancing) in an effort to limit this patient's exposure and mitigate transmission in our community.  Due to his co-morbid illnesses, this patient is at least at moderate risk for complications without adequate follow up.  This format is felt to be most appropriate for this patient at this time.  The patient did not have access to video technology/had technical difficulties with video requiring transitioning to audio format only (telephone).  All issues noted in this document were discussed and addressed.  No physical exam could be performed with this format.  Please refer to the patient's chart for his  consent to telehealth for [facility].   The patient was identified using 2 identifiers.  Patient Location: Home Provider Location: Office/Clinic  Date:  03/18/2020   ID:  Celine Ahr, DOB [birth year], MRN [id number]  PCP:  Patient, No Pcp Per  Cardiologist:  Jodelle Red, MD  CC: post hospital follow up  History of Present Illness:    Joseph Ray is a 45 y.o. male with a hx of Covid, new heart failure who is seen for post hospital follow up for the evaluation and management of heart failure. He had a positive Covid test on 03/11/20 and was hospitalized but did not require Covid treatment and had minimal symptoms.   Due to being within the short term window of his covid positive test, this was converted to a telephone visit.  Lasix helping, taking daily. Breathing is great, able to sleep at night for the first time in a while. Waiting on insurance card, hasn't gotten carvedilol or entresto yet but should be getting next week. Cost is high, discussed entresto copay program, he will go online to get this information and call us with any issues.   Denies chest pain, shortness of breath at rest or with normal exertion.  No PND, orthopnea, LE edema or unexpected weight gain. No syncope or palpitations.  Past Medical History:  Diagnosis Date  . Achilles tendon tear    left  . Snores     Past Surgical History:  Procedure Laterality Date  . ACHILLES TENDON SURGERY Left 03/25/2013   Procedure: LEFT ACHILLES TENDON REPAIR;  Surgeon: [name], MD;  Location: [facility];  Service: Orthopedics;  Laterality: Left;    Current Medications: Current Outpatient Medications on File Prior to Visit  Medication Sig  . ascorbic acid (VITAMIN C) 500 MG tablet Take 1 tablet (500 mg total) by mouth daily.  . carvedilol (COREG) 12.5 MG tablet Take 1 tablet (12.5 mg total) by mouth 2 (two) times daily with a meal.  . furosemide (LASIX) 40 MG tablet Take 1 tablet (40 mg total) by mouth daily.  . sacubitril-valsartan (ENTRESTO) 24-26 MG Take 1 tablet by mouth 2 (two) times daily.  Marland Kitchen zinc sulfate 220 (50 Zn) MG capsule Take 1 capsule (220 mg total) by mouth daily.   No current facility-administered medications on file prior to visit.     Allergies:   Penicillins   Social History   Tobacco Use  . Smoking status: Current Some Day Smoker    Packs/day: 0.15  . Smokeless tobacco: Never Used  Vaping Use  . Vaping Use: Never used  Substance Use Topics  . Alcohol use: Yes    Comment: social  . Drug use: No    Family History: family history includes  Hypertension in his father and mother.  ROS:   Please see the history of present illness.  Additional pertinent ROS: Constitutional: Negative for chills, fever, night sweats, unintentional weight loss  HENT: Negative for ear pain and hearing loss.   Eyes: Negative for loss of vision and eye pain.  Respiratory: Negative for cough, sputum, wheezing.   Cardiovascular: See HPI. Gastrointestinal: Negative for abdominal pain, melena, and hematochezia.  Genitourinary: Negative for dysuria and hematuria.  Musculoskeletal: Negative for falls and myalgias.   Skin: Negative for itching and rash.  Neurological: Negative for focal weakness, focal sensory changes and loss of consciousness.  Endo/Heme/Allergies: Does not bruise/bleed easily.     EKGs/Labs/Other Studies Reviewed:    The following studies were reviewed today: Echo 03/12/20 1. Left ventricular ejection fraction, by estimation, is 30 to 35%. The  left ventricle has moderately decreased function. The left ventricle  demonstrates global hypokinesis. The left ventricular internal cavity size  was moderately dilated. There is  moderate left ventricular hypertrophy. Left ventricular diastolic  parameters are consistent with Grade I diastolic dysfunction (impaired  relaxation).  2. The mitral valve is normal in structure. Mild mitral valve  regurgitation.  3. The aortic valve is normal in structure. Aortic valve regurgitation is  not visualized. No aortic stenosis is present.  4. The inferior vena cava is normal in size with greater than 50%  respiratory variability, suggesting right atrial pressure of 3 mmHg.    EKG:  EKG is personally reviewed.  The ekg ordered 03/11/20 demonstrates NSR, LVH, nonspecific t wave changes  Recent Labs: 03/11/2020: B Natriuretic Peptide 291.4 03/13/2020: ALT 64; BUN 17; Creatinine, Ser 1.11; Hemoglobin 14.8; Magnesium 2.2; Platelets 213; Potassium 3.5; Sodium 139  Recent Lipid Panel    Component Value Date/Time   CHOL 193 03/12/2020 0455   TRIG 152 (H) 03/12/2020 0455   HDL 26 (L) 03/12/2020 0455   CHOLHDL 7.4 03/12/2020 0455   VLDL 30 03/12/2020 0455   LDLCALC 137 (H) 03/12/2020 0455    Physical Exam:    VS:  There were no vitals taken for this visit.    Wt Readings from Last 3 Encounters:  03/11/20 290 lb (131.5 kg)  03/16/17 270 lb (122.5 kg)  05/23/16 270 lb (122.5 kg)    Speaking comfortably on the phone, no audible wheezing In no acute distress Alert and oriented Normal affect Normal speech  ASSESSMENT:    1.  Cardiomyopathy, unspecified type ([place])   2. Acute combined systolic and diastolic congestive heart failure ([place])   3. COVID-19 virus detected   4. Essential hypertension   5. Cardiac risk counseling    PLAN:    Cardiomyopathy, unknown etiology Recent acute systolic and diastolic heart failure  Covid positive 03/11/20 -taking lasix daily, no swelling or shortness of breath -NYHA class II today -waiting for new insurance card, hasn't gotten entresto or carvedilol yet -instructed on how to get the entresto copay card, will call with any issues -given BP in hospital, suspect we will be able to uptitrate entresto at follow up -close follow up to discuss sleep study, ischemic eval -discuss aspirin, statin based on results of ischemic eval  Hypertension: -no readings today -follow up, should allow for uptitration of entresto as long as Cr/K stable  Cardiac risk counseling and prevention recommendations: -recommend heart healthy/Mediterranean diet, with whole grains, fruits, vegetable, fish, lean meats, nuts, and olive oil. Limit salt. -recommend moderate walking, 3-5 times/week for 30-50 minutes each session. Aim for at least 150  minutes.week. Goal should be pace of 3 miles/hours, or walking 1.5 miles in 30 minutes -recommend avoidance of tobacco products. Avoid excess alcohol. -Additional risk factor control:  -Diabetes risk: A1c is 5.9  -Lipids: LDL 137, TG 152  -Weight: need follow up dry weight -ASCVD risk score: The 10-year ASCVD risk score Denman George DC Montez Hageman., et al., 2013) is: 17.5%   Values used to calculate the score:     Age: 9 years     Sex: Male     Is Non-Hispanic African American: Yes     Diabetic: No     Tobacco smoker: Yes     Systolic Blood Pressure: 151 mmHg     Is BP treated: Yes     HDL Cholesterol: 26 mg/dL     Total Cholesterol: 193 mg/dL    Plan for follow up: 4-6 weeks in person, BMET at that visit  Today, I have spent 11 minutes with the patient with  telehealth technology discussing the above problems.  Additional time spent in chart review, documentation, and communication.  Jodelle Red, MD, PhD [place]  [facility]    Medication Adjustments/Labs and Tests Ordered: Current medicines are reviewed at length with the patient today.  Concerns regarding medicines are outlined above.  No orders of the defined types were placed in this encounter.  No orders of the defined types were placed in this encounter.   Patient Instructions  Medication Instructions:  Your Physician recommend you continue on your current medication as directed.    *If you need a refill on your cardiac medications before your next appointment, please call your pharmacy*   Lab Work: None   Testing/Procedures: None   Follow-Up: At [facility], you and your health needs are our priority.  As part of our continuing mission to provide you with exceptional heart care, we have created designated Provider Care Teams.  These Care Teams include your primary Cardiologist (physician) and Advanced Practice Providers (APPs -  Physician Assistants and Nurse Practitioners) who all work together to provide you with the care you need, when you need it.  We recommend signing up for the patient portal called "MyChart".  Sign up information is provided on this After Visit Summary.  MyChart is used to connect with patients for Virtual Visits (Telemedicine).  Patients are able to view lab/test results, encounter notes, upcoming appointments, etc.  Non-urgent messages can be sent to your provider as well.   To learn more about what you can do with MyChart, go to [email or web address].    Your next appointment:   4-6 week(s)  The format for your next appointment:   In Person  Provider:   Jodelle Red, MD      Signed, Jodelle Red, MD PhD 03/18/2020 9:14 AM    [facility]

## 2020-03-18 NOTE — Patient Instructions (Signed)
Medication Instructions:  Your Physician recommend you continue on your current medication as directed.    *If you need a refill on your cardiac medications before your next appointment, please call your pharmacy*   Lab Work: None   Testing/Procedures: None   Follow-Up: At [facility], you and your health needs are our priority.  As part of our continuing mission to provide you with exceptional heart care, we have created designated Provider Care Teams.  These Care Teams include your primary Cardiologist (physician) and Advanced Practice Providers (APPs -  Physician Assistants and Nurse Practitioners) who all work together to provide you with the care you need, when you need it.  We recommend signing up for the patient portal called "MyChart".  Sign up information is provided on this After Visit Summary.  MyChart is used to connect with patients for Virtual Visits (Telemedicine).  Patients are able to view lab/test results, encounter notes, upcoming appointments, etc.  Non-urgent messages can be sent to your provider as well.   To learn more about what you can do with MyChart, go to [email or web address].    Your next appointment:   4-6 week(s)  The format for your next appointment:   In Person  Provider:   Bridgette Christopher, MD    

## 2020-04-22 ENCOUNTER — Ambulatory Visit: Payer: Self-pay | Admitting: Cardiology

## 2020-04-22 NOTE — Progress Notes (Incomplete)
Cardiology Office Note:    Date:  04/22/2020   ID:  Joseph Ray, DOB 06-03-1975, MRN [id number]  PCP:  Patient, No Pcp Per  Cardiologist:  Jodelle Red, MD  CC: follow up  History of Present Illness:    Joseph Ray is a 45 y.o. male with a hx of Covid, new heart failure who is seen for post hospital follow up for the evaluation and management of heart failure. He had a positive Covid test on 03/11/20 and was hospitalized but did not require Covid treatment and had minimal symptoms.   Due to being within the short term window of his covid positive test, this was converted to a telephone visit.  Lasix helping, taking daily. Breathing is great, able to sleep at night for the first time in a while. Waiting on insurance card, hasn't gotten carvedilol or entresto yet but should be getting next week. Cost is high, discussed entresto copay program, he will go online to get this information and call us with any issues.   Denies chest pain, shortness of breath at rest or with normal exertion. No PND, orthopnea, LE edema or unexpected weight gain. No syncope or palpitations.  Past Medical History:  Diagnosis Date  . Achilles tendon tear    left  . Snores     Past Surgical History:  Procedure Laterality Date  . ACHILLES TENDON SURGERY Left 03/25/2013   Procedure: LEFT ACHILLES TENDON REPAIR;  Surgeon: [name], MD;  Location: [facility];  Service: Orthopedics;  Laterality: Left;    Current Medications: Current Outpatient Medications on File Prior to Visit  Medication Sig  . ascorbic acid (VITAMIN C) 500 MG tablet Take 1 tablet (500 mg total) by mouth daily.  . carvedilol (COREG) 12.5 MG tablet Take 1 tablet (12.5 mg total) by mouth 2 (two) times daily with a meal.  . furosemide (LASIX) 40 MG tablet Take 1 tablet (40 mg total) by mouth daily.  . sacubitril-valsartan (ENTRESTO) 24-26 MG Take 1 tablet by mouth 2 (two) times daily.  Marland Kitchen zinc sulfate 220 (50 Zn)  MG capsule Take 1 capsule (220 mg total) by mouth daily.   No current facility-administered medications on file prior to visit.     Allergies:   Penicillins   Social History   Tobacco Use  . Smoking status: Current Some Day Smoker    Packs/day: 0.15  . Smokeless tobacco: Never Used  Vaping Use  . Vaping Use: Never used  Substance Use Topics  . Alcohol use: Yes    Comment: social  . Drug use: No    Family History: family history includes Hypertension in his father and mother.  ROS:   Please see the history of present illness.  Additional pertinent ROS: Constitutional: Negative for chills, fever, night sweats, unintentional weight loss  HENT: Negative for ear pain and hearing loss.   Eyes: Negative for loss of vision and eye pain.  Respiratory: Negative for cough, sputum, wheezing.   Cardiovascular: See HPI. Gastrointestinal: Negative for abdominal pain, melena, and hematochezia.  Genitourinary: Negative for dysuria and hematuria.  Musculoskeletal: Negative for falls and myalgias.  Skin: Negative for itching and rash.  Neurological: Negative for focal weakness, focal sensory changes and loss of consciousness.  Endo/Heme/Allergies: Does not bruise/bleed easily.     EKGs/Labs/Other Studies Reviewed:    The following studies were reviewed today: Echo 03/12/20 1. Left ventricular ejection fraction, by estimation, is 30 to 35%. The  left ventricle has moderately  decreased function. The left ventricle  demonstrates global hypokinesis. The left ventricular internal cavity size  was moderately dilated. There is  moderate left ventricular hypertrophy. Left ventricular diastolic  parameters are consistent with Grade I diastolic dysfunction (impaired  relaxation).  2. The mitral valve is normal in structure. Mild mitral valve  regurgitation.  3. The aortic valve is normal in structure. Aortic valve regurgitation is  not visualized. No aortic stenosis is present.  4. The  inferior vena cava is normal in size with greater than 50%  respiratory variability, suggesting right atrial pressure of 3 mmHg.    EKG:  EKG is personally reviewed.  The ekg ordered 03/11/20 demonstrates NSR, LVH, nonspecific t wave changes  Recent Labs: 03/11/2020: B Natriuretic Peptide 291.4 03/13/2020: ALT 64; BUN 17; Creatinine, Ser 1.11; Hemoglobin 14.8; Magnesium 2.2; Platelets 213; Potassium 3.5; Sodium 139  Recent Lipid Panel    Component Value Date/Time   CHOL 193 03/12/2020 0455   TRIG 152 (H) 03/12/2020 0455   HDL 26 (L) 03/12/2020 0455   CHOLHDL 7.4 03/12/2020 0455   VLDL 30 03/12/2020 0455   LDLCALC 137 (H) 03/12/2020 0455    Physical Exam:    VS:  There were no vitals taken for this visit.    Wt Readings from Last 3 Encounters:  03/11/20 290 lb (131.5 kg)  03/16/17 270 lb (122.5 kg)  05/23/16 270 lb (122.5 kg)    Speaking comfortably on the phone, no audible wheezing In no acute distress Alert and oriented Normal affect Normal speech  ASSESSMENT:    No diagnosis found. PLAN:    Cardiomyopathy, unknown etiology Recent acute systolic and diastolic heart failure  Covid positive 03/11/20 -taking lasix daily, no swelling or shortness of breath -NYHA class II today -waiting for new insurance card, hasn't gotten entresto or carvedilol yet -instructed on how to get the entresto copay card, will call with any issues -given BP in hospital, suspect we will be able to uptitrate entresto at follow up -close follow up to discuss sleep study, ischemic eval -discuss aspirin, statin based on results of ischemic eval  Hypertension: -no readings today -follow up, should allow for uptitration of entresto as long as Cr/K stable  Cardiac risk counseling and prevention recommendations: -recommend heart healthy/Mediterranean diet, with whole grains, fruits, vegetable, fish, lean meats, nuts, and olive oil. Limit salt. -recommend moderate walking, 3-5 times/week for  30-50 minutes each session. Aim for at least 150 minutes.week. Goal should be pace of 3 miles/hours, or walking 1.5 miles in 30 minutes -recommend avoidance of tobacco products. Avoid excess alcohol. -Additional risk factor control:  -Diabetes risk: A1c is 5.9  -Lipids: LDL 137, TG 152  -Weight: need follow up dry weight -ASCVD risk score: The 10-year ASCVD risk score Denman George DC Montez Hageman., et al., 2013) is: 17.5%   Values used to calculate the score:     Age: 40 years     Sex: Male     Is Non-Hispanic African American: Yes     Diabetic: No     Tobacco smoker: Yes     Systolic Blood Pressure: 151 mmHg     Is BP treated: Yes     HDL Cholesterol: 26 mg/dL     Total Cholesterol: 193 mg/dL    Plan for follow up: 4-6 weeks in person, BMET at that visit  Today, I have spent 11 minutes with the patient with telehealth technology discussing the above problems.  Additional time spent in chart review, documentation, and communication.  Jodelle Red, MD, PhD [place]  [facility]    Medication Adjustments/Labs and Tests Ordered: Current medicines are reviewed at length with the patient today.  Concerns regarding medicines are outlined above.  No orders of the defined types were placed in this encounter.  No orders of the defined types were placed in this encounter.   There are no Patient Instructions on file for this visit.  Signed, Jodelle Red, MD PhD 04/22/2020 7:58 AM    [facility]

## 2020-04-24 ENCOUNTER — Ambulatory Visit: Payer: Self-pay | Admitting: Cardiology

## 2020-06-13 ENCOUNTER — Encounter (HOSPITAL_COMMUNITY): Payer: Self-pay

## 2020-06-13 ENCOUNTER — Emergency Department (HOSPITAL_COMMUNITY)
Admission: EM | Admit: 2020-06-13 | Discharge: 2020-06-14 | Disposition: A | Discharge status: Home / Self Care | Payer: No Typology Code available for payment source | Attending: Emergency Medicine | Admitting: Emergency Medicine

## 2020-06-13 ENCOUNTER — Emergency Department (HOSPITAL_COMMUNITY): Payer: No Typology Code available for payment source

## 2020-06-13 ENCOUNTER — Other Ambulatory Visit: Payer: Self-pay

## 2020-06-13 DIAGNOSIS — Z8616 Personal history of COVID-19: Secondary | ICD-10-CM | POA: Insufficient documentation

## 2020-06-13 DIAGNOSIS — I509 Heart failure, unspecified: Secondary | ICD-10-CM | POA: Diagnosis not present

## 2020-06-13 DIAGNOSIS — I110 Hypertensive heart disease with heart failure: Secondary | ICD-10-CM | POA: Diagnosis not present

## 2020-06-13 DIAGNOSIS — F17200 Nicotine dependence, unspecified, uncomplicated: Secondary | ICD-10-CM | POA: Diagnosis not present

## 2020-06-13 DIAGNOSIS — R1013 Epigastric pain: Secondary | ICD-10-CM | POA: Diagnosis present

## 2020-06-13 DIAGNOSIS — Z79899 Other long term (current) drug therapy: Secondary | ICD-10-CM | POA: Insufficient documentation

## 2020-06-13 DIAGNOSIS — K429 Umbilical hernia without obstruction or gangrene: Secondary | ICD-10-CM | POA: Diagnosis not present

## 2020-06-13 LAB — COMPREHENSIVE METABOLIC PANEL
ALT: 29 U/L (ref 0–44)
AST: 27 U/L (ref 15–41)
Albumin: 4.3 g/dL (ref 3.5–5.0)
Alkaline Phosphatase: 40 U/L (ref 38–126)
Anion gap: 8 (ref 5–15)
BUN: 15 mg/dL (ref 6–20)
CO2: 24 mmol/L (ref 22–32)
Calcium: 9.2 mg/dL (ref 8.9–10.3)
Chloride: 106 mmol/L (ref 98–111)
Creatinine, Ser: 1.05 mg/dL (ref 0.61–1.24)
GFR, Estimated: >60 mL/min (ref >60)
Glucose, Bld: 110 mg/dL — ABNORMAL HIGH (ref 70–99)
Potassium: 3.6 mmol/L (ref 3.5–5.1)
Sodium: 138 mmol/L (ref 135–145)
Total Bilirubin: 0.8 mg/dL (ref 0.3–1.2)
Total Protein: 7.9 g/dL (ref 6.5–8.1)

## 2020-06-13 LAB — TROPONIN I (HIGH SENSITIVITY): Troponin I (High Sensitivity): 44 ng/L — ABNORMAL HIGH (ref <18)

## 2020-06-13 LAB — CBC
HCT: 41.9 % (ref 39.0–52.0)
Hemoglobin: 14 g/dL (ref 13.0–17.0)
MCH: 30.6 pg (ref 26.0–34.0)
MCHC: 33.4 g/dL (ref 30.0–36.0)
MCV: 91.5 fL (ref 80.0–100.0)
Platelets: 230 10*3/uL (ref 150–400)
RBC: 4.58 MIL/uL (ref 4.22–5.81)
RDW: 14.1 % (ref 11.5–15.5)
WBC: 7.2 10*3/uL (ref 4.0–10.5)
nRBC: 0 % (ref 0.0–0.2)

## 2020-06-13 LAB — LIPASE, BLOOD: Lipase: 30 U/L (ref 11–51)

## 2020-06-13 MED ORDER — ONDANSETRON HCL 4 MG/2ML IJ SOLN
4.0000 mg | Freq: Once | INTRAMUSCULAR | Status: AC
  Administered 2020-06-13: 4 mg via INTRAVENOUS
  Filled 2020-06-13: qty 2

## 2020-06-13 MED ORDER — IOHEXOL 300 MG/ML  SOLN
100.0000 mL | Freq: Once | INTRAMUSCULAR | Status: AC | PRN
  Administered 2020-06-14: 100 mL via INTRAVENOUS

## 2020-06-13 MED ORDER — MORPHINE SULFATE (PF) 4 MG/ML IV SOLN
4.0000 mg | Freq: Once | INTRAVENOUS | Status: AC
  Administered 2020-06-13: 4 mg via INTRAVENOUS
  Filled 2020-06-13: qty 1

## 2020-06-13 NOTE — ED Provider Notes (Signed)
MSE was initiated and I personally evaluated the patient and placed orders (if any) at  10:22 PM on June 13, 2020.  Pt. Here with abdominal pain x 2 hours.  Hx of hernia.  Pain moderate to severe.   No vomiting or fever.   Discussed with patient that their care has been initiated.   They are counseled that they will need to remain in the ED until the completion of their workup, including full H&P and results of any tests.  Risks of leaving the emergency department prior to completion of treatment were discussed. Patient was advised to inform ED staff if they are leaving before their treatment is complete. The patient acknowledged these risks and time was allowed for questions.    The patient appears stable so that the remainder of the MSE may be completed by another provider.    Roxy Horseman, PA-C 06/13/20 2223    Milagros Loll, MD 06/15/20 [phone]

## 2020-06-13 NOTE — ED Triage Notes (Signed)
Pt reports mid abdominal pain that began 2 hours ago. Pt denies N/V/D.

## 2020-06-14 ENCOUNTER — Emergency Department (HOSPITAL_COMMUNITY): Payer: No Typology Code available for payment source

## 2020-06-14 LAB — URINALYSIS, ROUTINE W REFLEX MICROSCOPIC
Bacteria, UA: NONE SEEN
Bilirubin Urine: NEGATIVE
Glucose, UA: NEGATIVE mg/dL
Hgb urine dipstick: NEGATIVE
Ketones, ur: NEGATIVE mg/dL
Leukocytes,Ua: NEGATIVE
Nitrite: NEGATIVE
Protein, ur: 30 mg/dL — AB
Specific Gravity, Urine: 1.02 (ref 1.005–1.030)
pH: 5 (ref 5.0–8.0)

## 2020-06-14 LAB — TROPONIN I (HIGH SENSITIVITY): Troponin I (High Sensitivity): 38 ng/L — ABNORMAL HIGH (ref <18)

## 2020-06-14 MED ORDER — FENTANYL CITRATE (PF) 100 MCG/2ML IJ SOLN
100.0000 ug | Freq: Once | INTRAMUSCULAR | Status: AC
  Administered 2020-06-14: 100 ug via INTRAVENOUS
  Filled 2020-06-14: qty 2

## 2020-06-14 NOTE — ED Provider Notes (Signed)
[facility]-EMERGENCY DEPT Provider Note   CSN: [id number] Arrival date & time: 06/13/20  2124     History Chief Complaint  Patient presents with  . Abdominal Pain    Joseph Ray is a 45 y.o. male.  45 yo M with a chief complaints of sudden onset abdominal pain.  Started about 2 hours prior to arrival here.  Pain localized to his hernia.  Has never had pain from the hernia before.  No nausea vomiting or constipation.  Denies trauma.  Denies fevers or chills.  The history is provided by the patient.  Abdominal Pain Pain location:  Epigastric Pain quality: aching and sharp   Pain radiates to:  Does not radiate Pain severity:  Moderate Onset quality:  Gradual Duration:  2 hours Timing:  Constant Progression:  Worsening Chronicity:  New Relieved by:  Nothing Worsened by:  Nothing Ineffective treatments:  None tried Associated symptoms: no chest pain, no chills, no diarrhea, no fever, no shortness of breath and no vomiting        Past Medical History:  Diagnosis Date  . Achilles tendon tear    left  . Snores     Patient Active Problem List   Diagnosis Date Noted  . Acute CHF (congestive heart failure) (HCC) 03/11/2020  . Essential hypertension 03/11/2020  . Ascending aorta dilatation (HCC) 03/11/2020  . COVID-19 virus detected 03/11/2020    Past Surgical History:  Procedure Laterality Date  . ACHILLES TENDON SURGERY Left 03/25/2013   Procedure: LEFT ACHILLES TENDON REPAIR;  Surgeon: [name], MD;  Location: [facility];  Service: Orthopedics;  Laterality: Left;       Family History  Problem Relation Age of Onset  . Hypertension Mother   . Hypertension Father     Social History   Tobacco Use  . Smoking status: Current Some Day Smoker    Packs/day: 0.15  . Smokeless tobacco: Never Used  Vaping Use  . Vaping Use: Never used  Substance Use Topics  . Alcohol use: Yes    Comment: social  . Drug use: No     Home Medications Prior to Admission medications   Medication Sig Start Date End Date Taking? Authorizing Provider  ascorbic acid (VITAMIN C) 500 MG tablet Take 1 tablet (500 mg total) by mouth daily. 03/14/20   Alwyn Ren, MD  carvedilol (COREG) 12.5 MG tablet Take 1 tablet (12.5 mg total) by mouth 2 (two) times daily with a meal. 03/13/20   Alwyn Ren, MD  furosemide (LASIX) 40 MG tablet Take 1 tablet (40 mg total) by mouth daily. 03/14/20   Alwyn Ren, MD  sacubitril-valsartan (ENTRESTO) 24-26 MG Take 1 tablet by mouth 2 (two) times daily. 03/13/20   Alwyn Ren, MD  zinc sulfate 220 (50 Zn) MG capsule Take 1 capsule (220 mg total) by mouth daily. 03/14/20   Alwyn Ren, MD    Allergies    Penicillins  Review of Systems   Review of Systems  Constitutional: Negative for chills and fever.  HENT: Negative for congestion and facial swelling.   Eyes: Negative for discharge and visual disturbance.  Respiratory: Negative for shortness of breath.   Cardiovascular: Negative for chest pain and palpitations.  Gastrointestinal: Positive for abdominal pain. Negative for diarrhea and vomiting.  Musculoskeletal: Negative for arthralgias and myalgias.  Skin: Negative for color change and rash.  Neurological: Negative for tremors, syncope and headaches.  Psychiatric/Behavioral: Negative for confusion and  dysphoric mood.    Physical Exam Updated Vital Signs BP (!) 160/105   Pulse 80   Temp 98.4 F (36.9 C) (Oral)   Resp (!) 21   SpO2 94%   Physical Exam Vitals and nursing note reviewed.  Constitutional:      Appearance: He is well-developed.  HENT:     Head: Normocephalic and atraumatic.  Eyes:     Pupils: Pupils are equal, round, and reactive to light.  Neck:     Vascular: No JVD.  Cardiovascular:     Rate and Rhythm: Normal rate and regular rhythm.     Heart sounds: No murmur heard. No friction rub. No gallop.   Pulmonary:      Effort: No respiratory distress.     Breath sounds: No wheezing.  Abdominal:     General: There is no distension.     Tenderness: There is abdominal tenderness in the epigastric area and periumbilical area. There is no guarding or rebound.     Comments: Palpable mass above the umbilicus.  Tender to palpation.  No overlying skin changes.  Musculoskeletal:        General: Normal range of motion.     Cervical back: Normal range of motion and neck supple.  Skin:    Coloration: Skin is not pale.     Findings: No rash.  Neurological:     Mental Status: He is alert and oriented to person, place, and time.  Psychiatric:        Behavior: Behavior normal.     ED Results / Procedures / Treatments   Labs (all labs ordered are listed, but only abnormal results are displayed) Labs Reviewed  COMPREHENSIVE METABOLIC PANEL - Abnormal; Notable for the following components:      Result Value   Glucose, Bld 110 (*)    All other components within normal limits  URINALYSIS, ROUTINE W REFLEX MICROSCOPIC - Abnormal; Notable for the following components:   Protein, ur 30 (*)    All other components within normal limits  TROPONIN I (HIGH SENSITIVITY) - Abnormal; Notable for the following components:   Troponin I (High Sensitivity) 44 (*)    All other components within normal limits  TROPONIN I (HIGH SENSITIVITY) - Abnormal; Notable for the following components:   Troponin I (High Sensitivity) 38 (*)    All other components within normal limits  LIPASE, BLOOD  CBC    EKG None  Radiology DG Chest 2 View  Result Date: 06/13/2020 CLINICAL DATA:  Epigastric pain for 2 hours EXAM: CHEST - 2 VIEW COMPARISON:  03/11/2020 FINDINGS: The heart size and mediastinal contours are within normal limits. Both lungs are clear. The visualized skeletal structures are unremarkable. IMPRESSION: No active cardiopulmonary disease. Electronically Signed   By: Burman Nieves M.D.   On: 06/13/2020 21:55   CT ABDOMEN  PELVIS W CONTRAST  Result Date: 06/14/2020 CLINICAL DATA:  Mid abdominal pain beginning 2 hours ago. History of hernias. EXAM: CT ABDOMEN AND PELVIS WITH CONTRAST TECHNIQUE: Multidetector CT imaging of the abdomen and pelvis was performed using the standard protocol following bolus administration of intravenous contrast. CONTRAST:  OMNIPAQUE IOHEXOL 300 MG/ML  SOLN COMPARISON:  CT chest 03/11/2020 FINDINGS: Lower chest: Lung bases are clear. Hepatobiliary: Mild diffuse fatty infiltration of the liver. No focal lesions identified. Gallbladder and bile ducts are unremarkable. Pancreas: Unremarkable. No pancreatic ductal dilatation or surrounding inflammatory changes. Spleen: Normal in size without focal abnormality. Adrenals/Urinary Tract: Adrenal glands are unremarkable. Kidneys are  normal, without renal calculi, focal lesion, or hydronephrosis. Bladder is unremarkable. Stomach/Bowel: Stomach is not distended. Supraumbilical hernia along the midline with defect measuring about 1.6 cm. The hernia contains a loop of small bowel. Proximal small bowel are mildly dilated with decompression of distal small bowel suggesting evidence of obstruction. No wall thickening or pneumatosis. There is also a periumbilical hernia containing mostly fat with a small portion of small bowel but without evidence of proximal obstruction. The colon is not abnormally distended. Colonic diverticula without evidence of diverticulitis. Appendix is normal. Vascular/Lymphatic: No significant vascular findings are present. No enlarged abdominal or pelvic lymph nodes. Reproductive: Prostate is unremarkable. Other: No free air or free fluid in the abdomen. Musculoskeletal: No acute or significant osseous findings. IMPRESSION: 1. Supraumbilical hernia containing a loop of small bowel. Proximal small bowel are mildly dilated with decompression of distal small bowel suggesting evidence of early obstruction. No wall thickening or pneumatosis. 2.  Periumbilical hernia containing mostly fat with an incomplete segment of small bowel. No proximal obstruction. 3. Mild diffuse fatty infiltration of the liver. 4. Colonic diverticulosis without evidence of diverticulitis. Electronically Signed   By: Burman Nieves M.D.   On: 06/14/2020 00:32    Procedures Hernia reduction  Date/Time: 06/14/2020 1:22 AM Performed by: Melene Plan, DO Authorized by: Melene Plan, DO  Consent: Verbal consent obtained. Risks and benefits: risks, benefits and alternatives were discussed Consent given by: patient Patient understanding: patient does not state understanding of the procedure being performed Patient consent: the patient's understanding of the procedure does not match consent given Required items: required blood products, implants, devices, and special equipment available Patient identity confirmed: verbally with patient Time out: Immediately prior to procedure a "time out" was called to verify the correct patient, procedure, equipment, support staff and site/side marked as required. Preparation: Patient was prepped and draped in the usual sterile fashion. Local anesthesia used: no  Anesthesia: Local anesthesia used: no  Sedation: Patient sedated: no  Patient tolerance: patient tolerated the procedure well with no immediate complications      Medications Ordered in ED Medications  morphine 4 MG/ML injection 4 mg (4 mg Intravenous Given 06/13/20 2304)  ondansetron (ZOFRAN) injection 4 mg (4 mg Intravenous Given 06/13/20 2304)  iohexol (OMNIPAQUE) 300 MG/ML solution 100 mL (100 mLs Intravenous Contrast Given 06/14/20 0000)  fentaNYL (SUBLIMAZE) injection 100 mcg (100 mcg Intravenous Given 06/14/20 0117)    ED Course  I have reviewed the triage vital signs and the nursing notes.  Pertinent labs & imaging results that were available during my care of the patient were reviewed by me and considered in my medical decision making (see chart for  details).    MDM Rules/Calculators/A&P                          45 yo M with a chief complaints of hernia pain.  Is been going on for a couple hours.  I was able to reduce his hernia with improvement of the symptoms.  Will observe in the ED.  Had blood work and CT scan ordered through quick look process.  CT scan with bowel contents in the superiormost area with signs of early bowel obstruction.   Patient observed in the ED, continues to feel well eating and drinking without issue.  Will discharge home.  General surgery follow-up.  4:07 AM:  I have discussed the diagnosis/risks/treatment options with the patient and family and believe the pt to  be eligible for discharge home to follow-up with Gen surgery. We also discussed returning to the ED immediately if new or worsening sx occur. We discussed the sx which are most concerning (e.g., sudden worsening pain, fever, inability to tolerate by mouth) that necessitate immediate return. Medications administered to the patient during their visit and any new prescriptions provided to the patient are listed below.  Medications given during this visit Medications  morphine 4 MG/ML injection 4 mg (4 mg Intravenous Given 06/13/20 2304)  ondansetron (ZOFRAN) injection 4 mg (4 mg Intravenous Given 06/13/20 2304)  iohexol (OMNIPAQUE) 300 MG/ML solution 100 mL (100 mLs Intravenous Contrast Given 06/14/20 0000)  fentaNYL (SUBLIMAZE) injection 100 mcg (100 mcg Intravenous Given 06/14/20 0117)     The patient appears reasonably screen and/or stabilized for discharge and I doubt any other medical condition or other [facility] requiring further screening, evaluation, or treatment in the ED at this time prior to discharge.    Final Clinical Impression(s) / ED Diagnoses Final diagnoses:  Periumbilical hernia    Rx / DC Orders ED Discharge Orders    None       Melene Plan, DO 06/14/20 0407

## 2020-06-14 NOTE — Discharge Instructions (Signed)
Follow up with general surgery in the office.  Return for worsening pain, vomiting.

## 2020-06-14 NOTE — ED Notes (Signed)
Pt given cup of ice water for fluid challenge

## 2020-06-14 NOTE — ED Notes (Signed)
02 sats dropped to 87% after giving fentanyl, pt in NAD.  2L 02 applied via [place], 02 sats now 97%.

## 2020-08-06 ENCOUNTER — Encounter (HOSPITAL_COMMUNITY): Payer: Self-pay | Admitting: *Deleted

## 2020-08-06 ENCOUNTER — Emergency Department (HOSPITAL_COMMUNITY): Payer: No Typology Code available for payment source

## 2020-08-06 ENCOUNTER — Other Ambulatory Visit: Payer: Self-pay

## 2020-08-06 ENCOUNTER — Emergency Department (HOSPITAL_COMMUNITY)
Admission: EM | Admit: 2020-08-06 | Discharge: 2020-08-06 | Disposition: A | Discharge status: Home / Self Care | Payer: No Typology Code available for payment source | Attending: Emergency Medicine | Admitting: Emergency Medicine

## 2020-08-06 DIAGNOSIS — I10 Essential (primary) hypertension: Secondary | ICD-10-CM

## 2020-08-06 DIAGNOSIS — K439 Ventral hernia without obstruction or gangrene: Secondary | ICD-10-CM

## 2020-08-06 DIAGNOSIS — Z79899 Other long term (current) drug therapy: Secondary | ICD-10-CM | POA: Insufficient documentation

## 2020-08-06 DIAGNOSIS — I110 Hypertensive heart disease with heart failure: Secondary | ICD-10-CM | POA: Insufficient documentation

## 2020-08-06 DIAGNOSIS — I509 Heart failure, unspecified: Secondary | ICD-10-CM | POA: Insufficient documentation

## 2020-08-06 DIAGNOSIS — F17210 Nicotine dependence, cigarettes, uncomplicated: Secondary | ICD-10-CM | POA: Insufficient documentation

## 2020-08-06 DIAGNOSIS — Z8616 Personal history of COVID-19: Secondary | ICD-10-CM | POA: Diagnosis not present

## 2020-08-06 DIAGNOSIS — R109 Unspecified abdominal pain: Secondary | ICD-10-CM | POA: Diagnosis present

## 2020-08-06 LAB — URINALYSIS, ROUTINE W REFLEX MICROSCOPIC
Bacteria, UA: NONE SEEN
Bilirubin Urine: NEGATIVE
Glucose, UA: NEGATIVE mg/dL
Hgb urine dipstick: NEGATIVE
Ketones, ur: NEGATIVE mg/dL
Leukocytes,Ua: NEGATIVE
Nitrite: NEGATIVE
Protein, ur: 100 mg/dL — AB
Specific Gravity, Urine: 1.02 (ref 1.005–1.030)
pH: 5 (ref 5.0–8.0)

## 2020-08-06 LAB — CBC WITH DIFFERENTIAL/PLATELET
Abs Immature Granulocytes: 0.02 10*3/uL (ref 0.00–0.07)
Basophils Absolute: 0 10*3/uL (ref 0.0–0.1)
Basophils Relative: 1 %
Eosinophils Absolute: 0.2 10*3/uL (ref 0.0–0.5)
Eosinophils Relative: 3 %
HCT: 46.3 % (ref 39.0–52.0)
Hemoglobin: 15.6 g/dL (ref 13.0–17.0)
Immature Granulocytes: 0 %
Lymphocytes Relative: 28 %
Lymphs Abs: 1.7 10*3/uL (ref 0.7–4.0)
MCH: 30.6 pg (ref 26.0–34.0)
MCHC: 33.7 g/dL (ref 30.0–36.0)
MCV: 90.8 fL (ref 80.0–100.0)
Monocytes Absolute: 0.7 10*3/uL (ref 0.1–1.0)
Monocytes Relative: 12 %
Neutro Abs: 3.5 10*3/uL (ref 1.7–7.7)
Neutrophils Relative %: 56 %
Platelets: 252 10*3/uL (ref 150–400)
RBC: 5.1 MIL/uL (ref 4.22–5.81)
RDW: 13.7 % (ref 11.5–15.5)
WBC: 6.1 10*3/uL (ref 4.0–10.5)
nRBC: 0 % (ref 0.0–0.2)

## 2020-08-06 LAB — COMPREHENSIVE METABOLIC PANEL
ALT: 37 U/L (ref 0–44)
AST: 32 U/L (ref 15–41)
Albumin: 4.5 g/dL (ref 3.5–5.0)
Alkaline Phosphatase: 47 U/L (ref 38–126)
Anion gap: 8 (ref 5–15)
BUN: 14 mg/dL (ref 6–20)
CO2: 31 mmol/L (ref 22–32)
Calcium: 9.8 mg/dL (ref 8.9–10.3)
Chloride: 101 mmol/L (ref 98–111)
Creatinine, Ser: 1.16 mg/dL (ref 0.61–1.24)
GFR, Estimated: >60 mL/min (ref >60)
Glucose, Bld: 142 mg/dL — ABNORMAL HIGH (ref 70–99)
Potassium: 4 mmol/L (ref 3.5–5.1)
Sodium: 140 mmol/L (ref 135–145)
Total Bilirubin: 0.5 mg/dL (ref 0.3–1.2)
Total Protein: 8.3 g/dL — ABNORMAL HIGH (ref 6.5–8.1)

## 2020-08-06 LAB — LIPASE, BLOOD: Lipase: 33 U/L (ref 11–51)

## 2020-08-06 MED ORDER — CARVEDILOL 12.5 MG PO TABS: 12.5000 mg | ORAL_TABLET | Freq: Two times a day (BID) | ORAL | 0 refills | Status: DC

## 2020-08-06 MED ORDER — FUROSEMIDE 40 MG PO TABS: 40.0000 mg | ORAL_TABLET | Freq: Every day | ORAL | 0 refills | Status: DC

## 2020-08-06 MED ORDER — ONDANSETRON HCL 4 MG/2ML IJ SOLN
4.0000 mg | Freq: Once | INTRAMUSCULAR | Status: AC
  Administered 2020-08-06: 4 mg via INTRAVENOUS
  Filled 2020-08-06: qty 2

## 2020-08-06 MED ORDER — CARVEDILOL 12.5 MG PO TABS
12.5000 mg | ORAL_TABLET | Freq: Two times a day (BID) | ORAL | Status: DC
  Administered 2020-08-06: 12.5 mg via ORAL
  Filled 2020-08-06: qty 1

## 2020-08-06 MED ORDER — HYDROMORPHONE HCL 1 MG/ML IJ SOLN
1.0000 mg | Freq: Once | INTRAMUSCULAR | Status: AC
Start: 2020-08-06 — End: 2020-08-06
  Administered 2020-08-06: 1 mg via INTRAVENOUS
  Filled 2020-08-06: qty 1

## 2020-08-06 MED ORDER — OXYCODONE-ACETAMINOPHEN 5-325 MG PO TABS
1.0000 | ORAL_TABLET | Freq: Once | ORAL | Status: AC
  Administered 2020-08-06: 1 via ORAL
  Filled 2020-08-06: qty 1

## 2020-08-06 NOTE — ED Notes (Signed)
An After Visit Summary was printed and given to the patient. Discharge instructions given and no further questions at this time.  

## 2020-08-06 NOTE — ED Notes (Signed)
Army Melia, PA aware pt BP 164/116.

## 2020-08-06 NOTE — ED Provider Notes (Signed)
Emergency Medicine Provider Triage Evaluation Note  Joseph Ray 45 y.o. male was evaluated in triage.  Pt complains of abd pain that began this morning. H/o hernia nad has had to have it reduced previously. Has not follow up with surgeon. No vomiting, fevers.     Review of Systems  Positive: abd pain Negative: Fevers, vomitin   Physical Exam  BP 134/82   Pulse 70   Temp 98.2 F (36.8 C) (Oral)   Resp 18   Ht 5\' 4"  (1.626 m)   Wt 65.8 kg   SpO2 100%   BMI 24.89 kg/m  Gen:   Awake, no distress   HEENT:  Atraumatic  Resp:  Normal effort  Cardiac:  Normal rate  Abd:   Nondistended, periumbilical tenderness noted with palpable hernia. No overlying warmth, erythema, edmea.  MSK:   Moves extremities without difficulty  Neuro:  Speech clear   Other:   Medical Decision Making  Medically screening exam initiated at 11:34 AM  Appropriate orders placed.  KALEM ROCKWELL was informed that the remainder of the evaluation will be completed by another provider, this initial triage assessment does not replace that evaluation. They are counseled that they will need to remain in the ED until the completion of their workup, including full H&P and results of any tests.  Risks of leaving the emergency department prior to completion of treatment were discussed. Patient was advised to inform ED staff if they are leaving before their treatment is complete. The patient acknowledged these risks and time was allowed for questions.     The patient appears stable so that the remainder of the MSE may be completed by another provider.   Clinical Impression  abd pain   Portions of this note were generated with Dragon dictation software. Dictation errors may occur despite best attempts at proofreading.      Celine Ahr, PA-C 08/06/20 1135    08/08/20, MD 08/07/20 [phone]

## 2020-08-06 NOTE — Discharge Instructions (Signed)
Follow up with general surgery, referral given, call today to schedule an appointment.  Take blood pressure medication as prescribed.

## 2020-08-06 NOTE — ED Provider Notes (Signed)
[facility]-EMERGENCY DEPT Provider Note   CSN: [id number] Arrival date & time: 08/06/20  1124     History Chief Complaint  Patient presents with  . Abdominal Pain    Joseph Ray is a 45 y.o. male.  45 year old male with history of ventral hernia presents with complaint of pain in his hernia onset this morning upon waking.  Last bowel movement was this morning, no vomiting.  No other complaints or concerns.  Patient is where he has his hernia however has not followed up with surgery.        Past Medical History:  Diagnosis Date  . Achilles tendon tear    left  . Snores     Patient Active Problem List   Diagnosis Date Noted  . Acute CHF (congestive heart failure) (HCC) 03/11/2020  . Essential hypertension 03/11/2020  . Ascending aorta dilatation (HCC) 03/11/2020  . COVID-19 virus detected 03/11/2020    Past Surgical History:  Procedure Laterality Date  . ACHILLES TENDON SURGERY Left 03/25/2013   Procedure: LEFT ACHILLES TENDON REPAIR;  Surgeon: [name], MD;  Location: [facility];  Service: Orthopedics;  Laterality: Left;       Family History  Problem Relation Age of Onset  . Hypertension Mother   . Hypertension Father     Social History   Tobacco Use  . Smoking status: Current Some Day Smoker    Packs/day: 0.15  . Smokeless tobacco: Never Used  Vaping Use  . Vaping Use: Never used  Substance Use Topics  . Alcohol use: Yes    Comment: social  . Drug use: No    Home Medications Prior to Admission medications   Medication Sig Start Date End Date Taking? Authorizing Provider  ascorbic acid (VITAMIN C) 500 MG tablet Take 1 tablet (500 mg total) by mouth daily. Patient not taking: Reported on 08/06/2020 03/14/20   Alwyn Ren, MD  carvedilol (COREG) 12.5 MG tablet Take 1 tablet (12.5 mg total) by mouth 2 (two) times daily with a meal. 08/06/20 09/05/20  Jeannie Fend, PA-C  furosemide (LASIX) 40 MG  tablet Take 1 tablet (40 mg total) by mouth daily. 08/06/20 09/05/20  Jeannie Fend, PA-C  sacubitril-valsartan (ENTRESTO) 24-26 MG Take 1 tablet by mouth 2 (two) times daily. Patient not taking: Reported on 08/06/2020 03/13/20   Alwyn Ren, MD  zinc sulfate 220 (50 Zn) MG capsule Take 1 capsule (220 mg total) by mouth daily. Patient not taking: Reported on 08/06/2020 03/14/20   Alwyn Ren, MD    Allergies    Penicillins  Review of Systems   Review of Systems  Constitutional: Negative for fever.  Respiratory: Negative for shortness of breath.   Cardiovascular: Negative for chest pain.  Gastrointestinal: Positive for abdominal pain. Negative for constipation, diarrhea, nausea and vomiting.  Genitourinary: Negative for difficulty urinating and dysuria.  Musculoskeletal: Negative for arthralgias and myalgias.  Skin: Negative for rash and wound.  Allergic/Immunologic: Negative for immunocompromised state.  Neurological: Negative for weakness.  All other systems reviewed and are negative.   Physical Exam Updated Vital Signs BP (!) 164/116   Pulse 94   Temp 98.7 F (37.1 C) (Oral)   Resp 18   SpO2 96%   Physical Exam Vitals and nursing note reviewed.  Constitutional:      General: He is not in acute distress.    Appearance: He is well-developed. He is not diaphoretic.  HENT:  Head: Normocephalic and atraumatic.  Cardiovascular:     Rate and Rhythm: Normal rate and regular rhythm.     Heart sounds: Normal heart sounds.  Pulmonary:     Effort: Pulmonary effort is normal.     Breath sounds: Normal breath sounds.  Abdominal:     General: Bowel sounds are normal.     Palpations: Abdomen is soft.     Tenderness: There is abdominal tenderness in the periumbilical area.     Hernia: A hernia is present. Hernia is present in the ventral area.  Skin:    General: Skin is warm and dry.     Findings: No rash.  Neurological:     Mental Status: He is alert and  oriented to person, place, and time.  Psychiatric:        Behavior: Behavior normal.     ED Results / Procedures / Treatments   Labs (all labs ordered are listed, but only abnormal results are displayed) Labs Reviewed  COMPREHENSIVE METABOLIC PANEL - Abnormal; Notable for the following components:      Result Value   Glucose, Bld 142 (*)    Total Protein 8.3 (*)    All other components within normal limits  CBC WITH DIFFERENTIAL/PLATELET  LIPASE, BLOOD  URINALYSIS, ROUTINE W REFLEX MICROSCOPIC    EKG None  Radiology CT ABDOMEN PELVIS WO CONTRAST  Result Date: 08/06/2020 CLINICAL DATA:  Ventral hernia pain EXAM: CT ABDOMEN AND PELVIS WITHOUT CONTRAST TECHNIQUE: Multidetector CT imaging of the abdomen and pelvis was performed following the standard protocol without IV contrast. COMPARISON:  06/14/2020 FINDINGS: Lower chest: Lung bases are clear.  Cardiomegaly. Hepatobiliary: Hepatic steatosis. Gallbladder is unremarkable. No biliary dilatation. Pancreas: Unremarkable. Spleen: Unremarkable. Adrenals/Urinary Tract: Adrenals, kidneys, and poorly distended bladder are unremarkable. Stomach/Bowel: Stomach is within normal limits. Bowel is normal in caliber. Supraumbilical hernia again noted to partially contain a small loop of bowel with minor relative dilatation of the small bowel proximal to this area. Decompressed small bowel also minimally extends into an umbilical hernia. Distal colonic diverticulosis. Vascular/Lymphatic: No significant vascular abnormality on this noncontrast study. No enlarged lymph nodes. Reproductive: Unremarkable. Other: Primarily fat containing umbilical hernia. Superimposed supraumbilical hernia with fluid and small portion of small-bowel. No surrounding fat infiltration. Musculoskeletal: No acute osseous abnormality. IMPRESSION: Stable appearance of supraumbilical hernia containing fluid and a very small loop of small bowel. Small bowel loops proximal to this area  have a greater caliber as compared to distal loops suggesting a degree of partial obstruction. Primarily fat containing umbilical hernia. A decompressed loop of small bowel minimally extends into the hernia as well. Hepatic steatosis. Distal colonic diverticulosis. Electronically Signed   By: Guadlupe Spanish M.D.   On: 08/06/2020 13:06    Procedures Hernia reduction  Date/Time: 08/06/2020 3:55 PM Performed by: Jeannie Fend, PA-C Authorized by: Jeannie Fend, PA-C  Consent: Verbal consent obtained. Written consent not obtained. Consent given by: patient Patient understanding: patient states understanding of the procedure being performed Patient identity confirmed: verbally with patient Local anesthesia used: no  Anesthesia: Local anesthesia used: no  Sedation: Patient sedated: no  Patient tolerance: patient tolerated the procedure well with no immediate complications      Medications Ordered in ED Medications  carvedilol (COREG) tablet 12.5 mg (has no administration in time range)  oxyCODONE-acetaminophen (PERCOCET/ROXICET) 5-325 MG per tablet 1 tablet (1 tablet Oral Given 08/06/20 1142)  ondansetron (ZOFRAN) injection 4 mg (4 mg Intravenous Given 08/06/20 1510)  HYDROmorphone (DILAUDID)  injection 1 mg (1 mg Intravenous Given 08/06/20 1510)    ED Course  I have reviewed the triage vital signs and the nursing notes.  Pertinent labs & imaging results that were available during my care of the patient were reviewed by me and considered in my medical decision making (see chart for details).  Clinical Course as of 08/06/20 1555  Thu Aug 06, 2020  [id number] 45 year old male with known ventral and umbilical hernias, woke up with pain today without vomiting or changes in bowel habits. Found to have a painful ventral hernia which was imaged in triage with concern for partal obstruction. Patient was given Dilaudid, hernia reduced without difficulty, tolerated well. Hypertensive today, needs  his meds refilled, given PO dose in the ER with rx to his pharmacy. Stressed importance of follow up with general surgery.  Patient is scheduled for new PCP for med management.  [LM]    Clinical Course User Index [LM] Alden Hipp   MDM Rules/Calculators/A&P                          Final Clinical Impression(s) / ED Diagnoses Final diagnoses:  Ventral hernia without obstruction or gangrene  Hypertension, unspecified type    Rx / DC Orders ED Discharge Orders         Ordered    carvedilol (COREG) 12.5 MG tablet  2 times daily with meals        08/06/20 1542    furosemide (LASIX) 40 MG tablet  Daily        08/06/20 1542           Alden Hipp 08/06/20 1555    Mancel Bale, MD 08/07/20 [phone]

## 2020-08-06 NOTE — ED Triage Notes (Signed)
Pt w/ hx of hernia reports he turned and feels like it popped out.

## 2020-08-11 DIAGNOSIS — I5022 Chronic systolic (congestive) heart failure: Secondary | ICD-10-CM | POA: Insufficient documentation

## 2020-08-12 DIAGNOSIS — E119 Type 2 diabetes mellitus without complications: Secondary | ICD-10-CM | POA: Insufficient documentation

## 2020-08-12 DIAGNOSIS — E1169 Type 2 diabetes mellitus with other specified complication: Secondary | ICD-10-CM | POA: Insufficient documentation

## 2020-08-20 DIAGNOSIS — K429 Umbilical hernia without obstruction or gangrene: Secondary | ICD-10-CM | POA: Insufficient documentation

## 2020-08-28 DIAGNOSIS — I429 Cardiomyopathy, unspecified: Secondary | ICD-10-CM | POA: Insufficient documentation

## 2020-08-28 DIAGNOSIS — R0609 Other forms of dyspnea: Secondary | ICD-10-CM | POA: Insufficient documentation

## 2020-09-28 ENCOUNTER — Emergency Department (HOSPITAL_COMMUNITY): Payer: No Typology Code available for payment source

## 2020-09-28 ENCOUNTER — Encounter (HOSPITAL_COMMUNITY): Payer: Self-pay

## 2020-09-28 ENCOUNTER — Emergency Department (HOSPITAL_COMMUNITY)
Admission: EM | Admit: 2020-09-28 | Discharge: 2020-09-28 | Disposition: A | Discharge status: Home / Self Care | Payer: No Typology Code available for payment source | Attending: Emergency Medicine | Admitting: Emergency Medicine

## 2020-09-28 DIAGNOSIS — Z20822 Contact with and (suspected) exposure to covid-19: Secondary | ICD-10-CM | POA: Insufficient documentation

## 2020-09-28 DIAGNOSIS — R109 Unspecified abdominal pain: Secondary | ICD-10-CM | POA: Diagnosis present

## 2020-09-28 DIAGNOSIS — I110 Hypertensive heart disease with heart failure: Secondary | ICD-10-CM | POA: Insufficient documentation

## 2020-09-28 DIAGNOSIS — Z8616 Personal history of COVID-19: Secondary | ICD-10-CM | POA: Diagnosis not present

## 2020-09-28 DIAGNOSIS — I10 Essential (primary) hypertension: Secondary | ICD-10-CM

## 2020-09-28 DIAGNOSIS — I509 Heart failure, unspecified: Secondary | ICD-10-CM | POA: Diagnosis not present

## 2020-09-28 DIAGNOSIS — K469 Unspecified abdominal hernia without obstruction or gangrene: Secondary | ICD-10-CM | POA: Insufficient documentation

## 2020-09-28 DIAGNOSIS — Z79899 Other long term (current) drug therapy: Secondary | ICD-10-CM | POA: Insufficient documentation

## 2020-09-28 DIAGNOSIS — F17210 Nicotine dependence, cigarettes, uncomplicated: Secondary | ICD-10-CM | POA: Insufficient documentation

## 2020-09-28 HISTORY — DX: Essential (primary) hypertension: I10

## 2020-09-28 HISTORY — DX: Heart failure, unspecified: I50.9

## 2020-09-28 LAB — BASIC METABOLIC PANEL
Anion gap: 9 (ref 5–15)
BUN: 15 mg/dL (ref 6–20)
CO2: 25 mmol/L (ref 22–32)
Calcium: 9.1 mg/dL (ref 8.9–10.3)
Chloride: 105 mmol/L (ref 98–111)
Creatinine, Ser: 1.06 mg/dL (ref 0.61–1.24)
GFR, Estimated: >60 mL/min (ref >60)
Glucose, Bld: 117 mg/dL — ABNORMAL HIGH (ref 70–99)
Potassium: 3.8 mmol/L (ref 3.5–5.1)
Sodium: 139 mmol/L (ref 135–145)

## 2020-09-28 LAB — SARS CORONAVIRUS 2 (TAT 6-24 HRS): SARS Coronavirus 2: NEGATIVE

## 2020-09-28 LAB — CBC
HCT: 40.9 % (ref 39.0–52.0)
Hemoglobin: 14.2 g/dL (ref 13.0–17.0)
MCH: 31.5 pg (ref 26.0–34.0)
MCHC: 34.7 g/dL (ref 30.0–36.0)
MCV: 90.7 fL (ref 80.0–100.0)
Platelets: 205 10*3/uL (ref 150–400)
RBC: 4.51 MIL/uL (ref 4.22–5.81)
RDW: 13.7 % (ref 11.5–15.5)
WBC: 7.7 10*3/uL (ref 4.0–10.5)
nRBC: 0 % (ref 0.0–0.2)

## 2020-09-28 MED ORDER — ACETAMINOPHEN 325 MG PO TABS
650.0000 mg | ORAL_TABLET | Freq: Once | ORAL | Status: AC
  Administered 2020-09-28: 650 mg via ORAL
  Filled 2020-09-28: qty 2

## 2020-09-28 MED ORDER — ONDANSETRON HCL 4 MG/2ML IJ SOLN
4.0000 mg | Freq: Once | INTRAMUSCULAR | Status: AC
  Administered 2020-09-28: 4 mg via INTRAVENOUS
  Filled 2020-09-28: qty 2

## 2020-09-28 MED ORDER — MORPHINE SULFATE (PF) 4 MG/ML IV SOLN
4.0000 mg | Freq: Once | INTRAVENOUS | Status: AC
  Administered 2020-09-28: 4 mg via INTRAVENOUS
  Filled 2020-09-28: qty 1

## 2020-09-28 NOTE — Consult Note (Signed)
Joseph Ray 1975/08/30  [id number].    Requesting MD: Dr. Manus Gunning Chief Complaint/Reason for Consult: Ventral and umbilical hernia  HPI: Joseph Ray is a 45 y.o. male with a hx of DM2, HTN and CHF (last EF 30-35%, global hypokinesis - Dec 2021) with known ventral and umbilical hernia's who presented with pain over hernia's. He reports 2 days ago he developed pain and a bulge over his hernia above his umbilicus. Pain gradually worsened and is worse with palpation. Denies n/v and is passing flatus. Was tolerating po's prior to arrival. Last [facility] 7/16. No prior abdominal surgeries. He reports he was supposed to have a robotic repair of the hernia's this past Thursday at [facility] but needed cardiac clearance first. I am having difficulty accessing care everywhere to see these notes. CT in the ED with ventral and umbilical hernia containing fat. WBC wnl. We were asked to see.   ROS: Review of Systems  Constitutional:  Negative for fever.  Gastrointestinal:  Positive for abdominal pain. Negative for nausea and vomiting.  All other systems reviewed and are negative.   Family History  Problem Relation Age of Onset   Hypertension Mother    Hypertension Father     Past Medical History:  Diagnosis Date   Achilles tendon tear    left   CHF (congestive heart failure) (HCC)    Hypertension    Snores     Past Surgical History:  Procedure Laterality Date   ACHILLES TENDON SURGERY Left 03/25/2013   Procedure: LEFT ACHILLES TENDON REPAIR;  Surgeon: [name], MD;  Location: [facility];  Service: Orthopedics;  Laterality: Left;    Social History:  reports that he has been smoking cigarettes. He has been smoking an average of .15 packs per day. He has never used smokeless tobacco. He reports current alcohol use. He reports that he does not use drugs.  Allergies:  Allergies  Allergen Reactions   Penicillins Rash    Has patient had a PCN reaction causing immediate rash,  facial/tongue/throat swelling, SOB or lightheadedness with hypotension: Yes/No:[id number]}No Has patient had a PCN reaction causing severe rash involving mucus membranes or skin necrosis: Yes/No:[id number]}No Has patient had a PCN reaction that required hospitalization Yes/No:[id number]}No Has patient had a PCN reaction occurring within the last 10 years: Yes/No:[id number]}No If all of the above answers are "NO", then may proceed with Cephalosporin     (Not in a hospital admission)    Physical Exam: Blood pressure (!) 154/116, pulse 92, temperature 98.4 F (36.9 C), temperature source Oral, resp. rate 18, height 6\' 2"  (1.88 m), weight (!) 141.5 kg, SpO2 98 %. General: pleasant, WD/WN AA male who is laying in bed in NAD HEENT: head is normocephalic, atraumatic.  Sclera are noninjected.  PERRL.  Ears and nose without any masses or lesions.  Mouth is pink and moist. Dentition fair Heart: regular, rate, and rhythm.  Normal s1,s2. No obvious murmurs, gallops, or rubs noted.  Palpable pedal pulses bilaterally  Lungs: CTAB, no wheezes, rhonchi, or rales noted.  Respiratory effort nonlabored Abd: Soft, ND, ventral and umbilical hernia that are tender to touch. Ventral hernia is not reducible. Umbilical hernia appears to at least partially reduce. There is no overlying skin changes. +BS, no masses, hernias, or organomegaly MS: no BUE/BLE edema, calves soft and nontender Skin: warm and dry with no masses, lesions, or rashes Psych: A&Ox4 with an appropriate affect Neuro: cranial nerves grossly intact, equal strength in BUE/BLE bilaterally,  normal speech, thought process intact, moves all extremities, gait not assessed   Results for orders placed or performed during the hospital encounter of 09/28/20 (from the past 48 hour(s))  CBC     Status: None   Collection Time: 09/28/20  7:32 AM  Result Value Ref Range   WBC 7.7 4.0 - 10.5 K/uL   RBC 4.51 4.22 - 5.81 MIL/uL   Hemoglobin 14.2 13.0 - 17.0 g/dL   HCT  78.2 95.6 - 21.3 %   MCV 90.7 80.0 - 100.0 fL   MCH 31.5 26.0 - 34.0 pg   MCHC 34.7 30.0 - 36.0 g/dL   RDW 08.6 57.8 - 46.9 %   Platelets 205 150 - 400 K/uL   nRBC 0.0 0.0 - 0.2 %    Comment: Performed at [facility], [address] [place], [place] [address]  Basic metabolic panel     Status: Abnormal   Collection Time: 09/28/20  7:32 AM  Result Value Ref Range   Sodium 139 135 - 145 mmol/L   Potassium 3.8 3.5 - 5.1 mmol/L   Chloride 105 98 - 111 mmol/L   CO2 25 22 - 32 mmol/L   Glucose, Bld 117 (H) 70 - 99 mg/dL    Comment: Glucose reference range applies only to samples taken after fasting for at least 8 hours.   BUN 15 6 - 20 mg/dL   Creatinine, Ser 8.41 0.61 - 1.24 mg/dL   Calcium 9.1 8.9 - 32.4 mg/dL   GFR, Estimated >40 >10 mL/min    Comment: (NOTE) Calculated using the CKD-EPI Creatinine Equation (2021)    Anion gap 9 5 - 15    Comment: Performed at [facility], [address] [place], [place] [address]   CT ABDOMEN PELVIS WO CONTRAST  Result Date: 09/28/2020 CLINICAL DATA:  Worsening abdominal hernia with pain. EXAM: CT ABDOMEN AND PELVIS WITHOUT CONTRAST TECHNIQUE: Multidetector CT imaging of the abdomen and pelvis was performed following the standard protocol without IV contrast. COMPARISON:  08/06/2020 FINDINGS: Lower chest: Unremarkable. Hepatobiliary: The liver shows diffusely decreased attenuation suggesting fat deposition. Subcapsular sparing noted along the gallbladder fossa. No focal abnormality in the liver on this study without intravenous contrast. There is no evidence for gallstones, gallbladder wall thickening, or pericholecystic fluid. No intrahepatic or extrahepatic biliary dilation. Pancreas: No focal mass lesion. No dilatation of the main duct. No intraparenchymal cyst. No peripancreatic edema. Spleen: No splenomegaly. No focal mass lesion. Adrenals/Urinary Tract: No adrenal nodule or mass. Right kidney unremarkable. 6  mm subcapsular lesion upper pole left kidney is too small to characterize but stable and likely benign. No evidence for hydroureter. The urinary bladder appears normal for the degree of distention. Stomach/Bowel: Stomach decompressed. Duodenum is normally positioned as is the ligament of Treitz. No small bowel wall thickening. No small bowel dilatation. The terminal ileum is normal. The appendix is not well visualized, but there is no edema or inflammation in the region of the cecum. No gross colonic mass. No colonic wall thickening. Diverticular changes are noted in the left colon without evidence of diverticulitis. Vascular/Lymphatic: No abdominal aortic aneurysm. No abdominal aortic atherosclerotic calcification. There is no gastrohepatic or hepatoduodenal ligament lymphadenopathy. No retroperitoneal or mesenteric lymphadenopathy. No pelvic sidewall lymphadenopathy. Reproductive: The prostate gland and seminal vesicles are unremarkable. Other: No intraperitoneal free fluid. Musculoskeletal: No worrisome lytic or sclerotic osseous abnormality. Supraumbilical midline ventral hernia identified with fascial defect approximately 7-8 cm cranial to the umbilicus. The defect  in the fascia measures 2.4 x 1.3 cm with preperitoneal and probably omental fat extending through the defect with hernia sac measuring 5.1 x 6.6 x 5.2 cm. There is edema and fluid in the hernia sac concerning for fat incarceration. Subtle edema noted in the subcutaneous fat around the hernia sac. Associated umbilical hernia identified with hernia sac measuring 4.1 x 3.4 x 3.0 cm. No substantial edema or inflammation in the umbilical hernia. IMPRESSION: 1. Supraumbilical midline ventral hernia with fascial defect approximately 7-8 cm cranial to the umbilicus. Hernia sac contains preperitoneal and probably omental fat measuring 5.1 x 6.6 x 5.2 cm. There is edema and fluid in the hernia sac concerning for fat incarceration. Subtle edema noted in the  subcutaneous fat around the hernia sac. 2. Associated umbilical hernia with hernia sac containing only fat and measuring 4.1 x 3.4 x 3.0 cm. No substantial edema or inflammation in the umbilical hernia. 3. Hepatic steatosis. Electronically Signed   By: Kennith Center M.D.   On: 09/28/2020 06:54    Anti-infectives (From admission, onward)    None        Assessment/Plan Ventral and Umbilical hernia - Patient with known ventral and umbilical hernia's. He reports he was supposed to have these repaired at [facility] this past Thursday but needed additional cardiac clearance before the surgery so it was rescheduled. He presented with 2 days of pain over hernia's. No n/v and continues to pass flatus. CT with fat incarceration but no incarceration/strangulated bowel. No overlying skin changes. Seen with my attending. Giving reassuring CT, okay for discharge. Can follow up [facility] (can also follow-up with [place] if he would like). Strict return precautions. Discussed with EDP.   Jacinto Halim, [facility] Surgery 09/28/2020, 9:35 AM Please see Amion for pager number during day hours 7:00am-4:30pm

## 2020-09-28 NOTE — ED Provider Notes (Signed)
Care transferred from previous provider at shift change. See note for full HPI   Hernia, pain x 2 days  FU on imaging and pain control   Physical Exam  BP (!) 154/116 (BP Location: Left Arm)   Pulse 92   Temp 98.4 F (36.9 C) (Oral)   Resp 18   Ht 6\' 2"  (1.88 m)   Wt (!) 141.5 kg   SpO2 98%   BMI 40.06 kg/m   Physical Exam Vitals and nursing note reviewed.  Constitutional:      General: He is not in acute distress.    Appearance: He is well-developed. He is obese. He is not ill-appearing, toxic-appearing or diaphoretic.  HENT:     Head: Atraumatic.  Eyes:     Pupils: Pupils are equal, round, and reactive to light.  Cardiovascular:     Rate and Rhythm: Normal rate and regular rhythm.     Pulses: Normal pulses.     Heart sounds: Normal heart sounds.  Pulmonary:     Effort: Pulmonary effort is normal. No respiratory distress.     Breath sounds: Normal breath sounds.  Abdominal:     General: There is no distension.     Palpations: Abdomen is soft.     Tenderness: There is abdominal tenderness.     Hernia: A hernia is present.     Comments: Soft reducible periumbilical hernia. Approx 5 cm round hard mass to abdomen suspicion for ventral hernia. No erythema, warmth  Musculoskeletal:        General: Normal range of motion.     Cervical back: Normal range of motion and neck supple.  Skin:    General: Skin is warm and dry.     Capillary Refill: Capillary refill takes less than 2 seconds.  Neurological:     General: No focal deficit present.     Mental Status: He is alert and oriented to person, place, and time.    ED Course/Procedures     Procedures Labs Reviewed  BASIC METABOLIC PANEL - Abnormal; Notable for the following components:      Result Value   Glucose, Bld 117 (*)    All other components within normal limits  SARS CORONAVIRUS 2 (TAT 6-24 HRS)  CBC   CT ABDOMEN PELVIS WO CONTRAST  Result Date: 09/28/2020 CLINICAL DATA:  Worsening abdominal hernia  with pain. EXAM: CT ABDOMEN AND PELVIS WITHOUT CONTRAST TECHNIQUE: Multidetector CT imaging of the abdomen and pelvis was performed following the standard protocol without IV contrast. COMPARISON:  08/06/2020 FINDINGS: Lower chest: Unremarkable. Hepatobiliary: The liver shows diffusely decreased attenuation suggesting fat deposition. Subcapsular sparing noted along the gallbladder fossa. No focal abnormality in the liver on this study without intravenous contrast. There is no evidence for gallstones, gallbladder wall thickening, or pericholecystic fluid. No intrahepatic or extrahepatic biliary dilation. Pancreas: No focal mass lesion. No dilatation of the main duct. No intraparenchymal cyst. No peripancreatic edema. Spleen: No splenomegaly. No focal mass lesion. Adrenals/Urinary Tract: No adrenal nodule or mass. Right kidney unremarkable. 6 mm subcapsular lesion upper pole left kidney is too small to characterize but stable and likely benign. No evidence for hydroureter. The urinary bladder appears normal for the degree of distention. Stomach/Bowel: Stomach decompressed. Duodenum is normally positioned as is the ligament of Treitz. No small bowel wall thickening. No small bowel dilatation. The terminal ileum is normal. The appendix is not well visualized, but there is no edema or inflammation in the region of the cecum. No gross colonic  mass. No colonic wall thickening. Diverticular changes are noted in the left colon without evidence of diverticulitis. Vascular/Lymphatic: No abdominal aortic aneurysm. No abdominal aortic atherosclerotic calcification. There is no gastrohepatic or hepatoduodenal ligament lymphadenopathy. No retroperitoneal or mesenteric lymphadenopathy. No pelvic sidewall lymphadenopathy. Reproductive: The prostate gland and seminal vesicles are unremarkable. Other: No intraperitoneal free fluid. Musculoskeletal: No worrisome lytic or sclerotic osseous abnormality. Supraumbilical midline ventral  hernia identified with fascial defect approximately 7-8 cm cranial to the umbilicus. The defect in the fascia measures 2.4 x 1.3 cm with preperitoneal and probably omental fat extending through the defect with hernia sac measuring 5.1 x 6.6 x 5.2 cm. There is edema and fluid in the hernia sac concerning for fat incarceration. Subtle edema noted in the subcutaneous fat around the hernia sac. Associated umbilical hernia identified with hernia sac measuring 4.1 x 3.4 x 3.0 cm. No substantial edema or inflammation in the umbilical hernia. IMPRESSION: 1. Supraumbilical midline ventral hernia with fascial defect approximately 7-8 cm cranial to the umbilicus. Hernia sac contains preperitoneal and probably omental fat measuring 5.1 x 6.6 x 5.2 cm. There is edema and fluid in the hernia sac concerning for fat incarceration. Subtle edema noted in the subcutaneous fat around the hernia sac. 2. Associated umbilical hernia with hernia sac containing only fat and measuring 4.1 x 3.4 x 3.0 cm. No substantial edema or inflammation in the umbilical hernia. 3. Hepatic steatosis. Electronically Signed   By: Kennith Center M.D.   On: 09/28/2020 06:54    MDM  FU on CT imaging, suspicion for hernia  Patient reassessed. Discussed CT findings. Patient placed in trendelenburg, pain controlled, Ice to abdomen. Unable to reduce ventral hernia. His periumbilical hernia is soft and reducible.   CONSULT with Casimiro Needle with general surgery. Will review imaging with attending.  Last PO intake 7 PM yesterday  Imaging has been reviewed with Dr. Carolynne Edouard and Casimiro Needle. Patient apparently was supposed to have surgery on his periumbilical hernia last week at [facility] however this was canceled due to HF with reduced EF. General surgery here today does not feel patient needs emergent surgical intervention and is stable to FU with [facility] surgery in the outpatient setting.  I discussed this with patient. He will call his surgeon to make aware of  new ventral hernia in additional to his periumbilical hernia. Declines need for additional pain meds at home.  Tolerating PO intake here without difficulty. Normal VS. Denies SOB, increase in LE edema. No hypoxia here in ED  DC home in stable condition.   The patient has been appropriately medically screened and/or stabilized in the ED. I have low suspicion for any other emergent medical condition which would require further screening, evaluation or treatment in the ED or require inpatient management.  Patient is hemodynamically stable and in no acute distress.  Patient able to ambulate in department prior to ED.  Evaluation does not show acute pathology that would require ongoing or additional emergent interventions while in the emergency department or further inpatient treatment.  I have discussed the diagnosis with the patient and answered all questions.  Pain is been managed while in the emergency department and patient has no further complaints prior to discharge.  Patient is comfortable with plan discussed in room and is stable for discharge at this time.  I have discussed strict return precautions for returning to the emergency department.  Patient was encouraged to follow-up with PCP/specialist refer to at discharge.       Joseph Ray,  Meric Joye A, PA-C 09/28/20 [id number]    Rolan Bucco, MD 09/28/20 [id number]

## 2020-09-28 NOTE — Discharge Instructions (Addendum)
Follow up with your surgeons ar [facility]  Return for new or worsening symptoms

## 2020-09-28 NOTE — ED Provider Notes (Signed)
[facility]-EMERGENCY DEPT Provider Note   CSN: [id number] Arrival date & time: 09/28/20  0424     History Chief Complaint  Patient presents with   Hernia    Joseph Ray is a 45 y.o. male presents to the Emergency Department complaining of gradual, persistent, progressively worsening central abd pain onset 2 days ago.  Pt reports nausea and decreased appetite, but no nausea or vomiting.  Pt denies fever, chills, back pain.  Pt reports he has had the hernia since birth, but has never had any issues.  Denies abd surgeries.  No aggravating or alleviating factors.     The history is provided by the patient and medical records. No language interpreter was used.      Past Medical History:  Diagnosis Date   Achilles tendon tear    left   CHF (congestive heart failure) (HCC)    Hypertension    Snores     Patient Active Problem List   Diagnosis Date Noted   Acute CHF (congestive heart failure) (HCC) 03/11/2020   Essential hypertension 03/11/2020   Ascending aorta dilatation (HCC) 03/11/2020   COVID-19 virus detected 03/11/2020    Past Surgical History:  Procedure Laterality Date   ACHILLES TENDON SURGERY Left 03/25/2013   Procedure: LEFT ACHILLES TENDON REPAIR;  Surgeon: [name], MD;  Location: [facility];  Service: Orthopedics;  Laterality: Left;       Family History  Problem Relation Age of Onset   Hypertension Mother    Hypertension Father     Social History   Tobacco Use   Smoking status: Some Days    Packs/day: 0.15    Types: Cigarettes   Smokeless tobacco: Never  Vaping Use   Vaping Use: Never used  Substance Use Topics   Alcohol use: Yes    Comment: social   Drug use: No    Home Medications Prior to Admission medications   Medication Sig Start Date End Date Taking? Authorizing Provider  ascorbic acid (VITAMIN C) 500 MG tablet Take 1 tablet (500 mg total) by mouth daily. Patient not taking: Reported on  08/06/2020 03/14/20   Alwyn Ren, MD  carvedilol (COREG) 12.5 MG tablet Take 1 tablet (12.5 mg total) by mouth 2 (two) times daily with a meal. 08/06/20 09/05/20  Jeannie Fend, PA-C  furosemide (LASIX) 40 MG tablet Take 1 tablet (40 mg total) by mouth daily. 08/06/20 09/05/20  Jeannie Fend, PA-C  sacubitril-valsartan (ENTRESTO) 24-26 MG Take 1 tablet by mouth 2 (two) times daily. Patient not taking: Reported on 08/06/2020 03/13/20   Alwyn Ren, MD  zinc sulfate 220 (50 Zn) MG capsule Take 1 capsule (220 mg total) by mouth daily. Patient not taking: Reported on 08/06/2020 03/14/20   Alwyn Ren, MD    Allergies    Penicillins  Review of Systems   Review of Systems  Constitutional:  Positive for appetite change. Negative for diaphoresis, fatigue, fever and unexpected weight change.  HENT:  Negative for mouth sores.   Eyes:  Negative for visual disturbance.  Respiratory:  Negative for cough, chest tightness, shortness of breath and wheezing.   Cardiovascular:  Negative for chest pain.  Gastrointestinal:  Positive for abdominal pain and nausea. Negative for constipation, diarrhea and vomiting.  Endocrine: Negative for polydipsia, polyphagia and polyuria.  Genitourinary:  Negative for dysuria, frequency, hematuria and urgency.  Musculoskeletal:  Negative for back pain and neck stiffness.  Skin:  Negative for rash.  Allergic/Immunologic: Negative for immunocompromised state.  Neurological:  Negative for syncope, light-headedness and headaches.  Hematological:  Does not bruise/bleed easily.  Psychiatric/Behavioral:  Negative for sleep disturbance. The patient is not nervous/anxious.    Physical Exam Updated Vital Signs BP (!) 179/128   Pulse 94   Temp 98.4 F (36.9 C) (Oral)   Resp 18   Ht 6\' 2"  (1.88 m)   Wt (!) 141.5 kg   SpO2 92%   BMI 40.06 kg/m   Physical Exam Vitals and nursing note reviewed.  Constitutional:      General: He is not in acute  distress.    Appearance: He is not diaphoretic.  HENT:     Head: Normocephalic.  Eyes:     General: No scleral icterus.    Conjunctiva/sclera: Conjunctivae normal.  Cardiovascular:     Rate and Rhythm: Normal rate and regular rhythm.     Pulses: Normal pulses.          Radial pulses are 2+ on the right side and 2+ on the left side.  Pulmonary:     Effort: No tachypnea, accessory muscle usage, prolonged expiration, respiratory distress or retractions.     Breath sounds: No stridor.     Comments: Equal chest rise. No increased work of breathing. Abdominal:     General: There is no distension.     Palpations: Abdomen is soft.     Tenderness: There is no abdominal tenderness. There is guarding. There is no rebound.    Musculoskeletal:     Cervical back: Normal range of motion.     Comments: Moves all extremities equally and without difficulty.  Skin:    General: Skin is warm and dry.     Capillary Refill: Capillary refill takes less than 2 seconds.  Neurological:     Mental Status: He is alert.     GCS: GCS eye subscore is 4. GCS verbal subscore is 5. GCS motor subscore is 6.     Comments: Speech is clear and goal oriented.  Psychiatric:        Mood and Affect: Mood normal.    ED Results / Procedures / Treatments   Labs (all labs ordered are listed, but only abnormal results are displayed) Labs Reviewed  SARS CORONAVIRUS 2 (TAT 6-24 HRS)  CBC  BASIC METABOLIC PANEL     Radiology CT ABDOMEN PELVIS WO CONTRAST  Result Date: 09/28/2020 CLINICAL DATA:  Worsening abdominal hernia with pain. EXAM: CT ABDOMEN AND PELVIS WITHOUT CONTRAST TECHNIQUE: Multidetector CT imaging of the abdomen and pelvis was performed following the standard protocol without IV contrast. COMPARISON:  08/06/2020 FINDINGS: Lower chest: Unremarkable. Hepatobiliary: The liver shows diffusely decreased attenuation suggesting fat deposition. Subcapsular sparing noted along the gallbladder fossa. No focal  abnormality in the liver on this study without intravenous contrast. There is no evidence for gallstones, gallbladder wall thickening, or pericholecystic fluid. No intrahepatic or extrahepatic biliary dilation. Pancreas: No focal mass lesion. No dilatation of the main duct. No intraparenchymal cyst. No peripancreatic edema. Spleen: No splenomegaly. No focal mass lesion. Adrenals/Urinary Tract: No adrenal nodule or mass. Right kidney unremarkable. 6 mm subcapsular lesion upper pole left kidney is too small to characterize but stable and likely benign. No evidence for hydroureter. The urinary bladder appears normal for the degree of distention. Stomach/Bowel: Stomach decompressed. Duodenum is normally positioned as is the ligament of Treitz. No small bowel wall thickening. No small bowel dilatation. The terminal ileum is normal. The appendix is not well  visualized, but there is no edema or inflammation in the region of the cecum. No gross colonic mass. No colonic wall thickening. Diverticular changes are noted in the left colon without evidence of diverticulitis. Vascular/Lymphatic: No abdominal aortic aneurysm. No abdominal aortic atherosclerotic calcification. There is no gastrohepatic or hepatoduodenal ligament lymphadenopathy. No retroperitoneal or mesenteric lymphadenopathy. No pelvic sidewall lymphadenopathy. Reproductive: The prostate gland and seminal vesicles are unremarkable. Other: No intraperitoneal free fluid. Musculoskeletal: No worrisome lytic or sclerotic osseous abnormality. Supraumbilical midline ventral hernia identified with fascial defect approximately 7-8 cm cranial to the umbilicus. The defect in the fascia measures 2.4 x 1.3 cm with preperitoneal and probably omental fat extending through the defect with hernia sac measuring 5.1 x 6.6 x 5.2 cm. There is edema and fluid in the hernia sac concerning for fat incarceration. Subtle edema noted in the subcutaneous fat around the hernia sac.  Associated umbilical hernia identified with hernia sac measuring 4.1 x 3.4 x 3.0 cm. No substantial edema or inflammation in the umbilical hernia. IMPRESSION: 1. Supraumbilical midline ventral hernia with fascial defect approximately 7-8 cm cranial to the umbilicus. Hernia sac contains preperitoneal and probably omental fat measuring 5.1 x 6.6 x 5.2 cm. There is edema and fluid in the hernia sac concerning for fat incarceration. Subtle edema noted in the subcutaneous fat around the hernia sac. 2. Associated umbilical hernia with hernia sac containing only fat and measuring 4.1 x 3.4 x 3.0 cm. No substantial edema or inflammation in the umbilical hernia. 3. Hepatic steatosis. Electronically Signed   By: Kennith Center M.D.   On: 09/28/2020 06:54    Procedures Procedures   Medications Ordered in ED Medications  morphine 4 MG/ML injection 4 mg (has no administration in time range)  ondansetron (ZOFRAN) injection 4 mg (has no administration in time range)    ED Course  I have reviewed the triage vital signs and the nursing notes.  Pertinent labs & imaging results that were available during my care of the patient were reviewed by me and considered in my medical decision making (see chart for details).    MDM Rules/Calculators/A&P                          Pt presents with central abd pain x 2 days. Exam consistent with incarcerated hernia.  Will give pain meds, ice and obtain land and imaging .   Ct scan with incarcerated hernia.  Pt is npo.  Labs pending  7:28 AM At shift change care was transferred to [facility] who will follow pending studies, re-evaulate and determine disposition.     Final Clinical Impression(s) / ED Diagnoses Final diagnoses:  Hernia of abdominal cavity  Hypertension, unspecified type    Rx / DC Orders ED Discharge Orders     None        Maciel Kegg, Boyd Kerbs 09/28/20 0729    Glynn Octave, MD 09/28/20 [phone]

## 2020-09-28 NOTE — ED Triage Notes (Signed)
Patient arrived with an abdominal hernia he states has been protruding more than it has in the past, complaints of some pain.

## 2020-11-23 ENCOUNTER — Encounter (HOSPITAL_COMMUNITY): Payer: Self-pay | Admitting: Emergency Medicine

## 2020-11-23 ENCOUNTER — Other Ambulatory Visit: Payer: Self-pay

## 2020-11-23 ENCOUNTER — Emergency Department (HOSPITAL_COMMUNITY)
Admission: EM | Admit: 2020-11-23 | Discharge: 2020-11-23 | Disposition: A | Discharge status: Home / Self Care | Payer: No Typology Code available for payment source | Attending: Emergency Medicine | Admitting: Emergency Medicine

## 2020-11-23 DIAGNOSIS — Z5321 Procedure and treatment not carried out due to patient leaving prior to being seen by health care provider: Secondary | ICD-10-CM | POA: Diagnosis not present

## 2020-11-23 DIAGNOSIS — R1010 Upper abdominal pain, unspecified: Secondary | ICD-10-CM | POA: Diagnosis not present

## 2020-11-23 LAB — COMPREHENSIVE METABOLIC PANEL
ALT: 31 U/L (ref 0–44)
AST: 27 U/L (ref 15–41)
Albumin: 4.2 g/dL (ref 3.5–5.0)
Alkaline Phosphatase: 40 U/L (ref 38–126)
Anion gap: 10 (ref 5–15)
BUN: 13 mg/dL (ref 6–20)
CO2: 28 mmol/L (ref 22–32)
Calcium: 9.6 mg/dL (ref 8.9–10.3)
Chloride: 101 mmol/L (ref 98–111)
Creatinine, Ser: 1.31 mg/dL — ABNORMAL HIGH (ref 0.61–1.24)
GFR, Estimated: >60 mL/min (ref >60)
Glucose, Bld: 121 mg/dL — ABNORMAL HIGH (ref 70–99)
Potassium: 3.7 mmol/L (ref 3.5–5.1)
Sodium: 139 mmol/L (ref 135–145)
Total Bilirubin: 0.7 mg/dL (ref 0.3–1.2)
Total Protein: 8.2 g/dL — ABNORMAL HIGH (ref 6.5–8.1)

## 2020-11-23 LAB — CBC
HCT: 44.8 % (ref 39.0–52.0)
Hemoglobin: 14.7 g/dL (ref 13.0–17.0)
MCH: 30.1 pg (ref 26.0–34.0)
MCHC: 32.8 g/dL (ref 30.0–36.0)
MCV: 91.6 fL (ref 80.0–100.0)
Platelets: 282 10*3/uL (ref 150–400)
RBC: 4.89 MIL/uL (ref 4.22–5.81)
RDW: 14 % (ref 11.5–15.5)
WBC: 6.3 10*3/uL (ref 4.0–10.5)
nRBC: 0 % (ref 0.0–0.2)

## 2020-11-23 LAB — LIPASE, BLOOD: Lipase: 33 U/L (ref 11–51)

## 2020-11-23 NOTE — ED Triage Notes (Signed)
Patient reports pain r/t hiatal hernia since yesterday. He states he was dx w/ hernia in December 2021, has surgery for removal scheduled. Denies n/v and loss of appetite. Denies any chest pain. Hx CHF and HTN.

## 2020-11-23 NOTE — ED Provider Notes (Signed)
Emergency Medicine Provider Triage Evaluation Note  Joseph Ray , a 45 y.o. male  was evaluated in triage.  Pt complains of abdominal pain since yesterday. Pain is sharp in his upper abdomen, feels like cramps but more intense with no radiation. Dx hiatal hernia in December 2021, has surgery for removal scheduled. Hx CHF and HTN.   Review of Systems  Positive: Abdominal pain, nausea Negative: Vomiting, constipation, diarrhea, chest pain, SOB  Physical Exam  BP (!) 170/115 (BP Location: Left Arm)   Pulse 85   Temp 98.2 F (36.8 C) (Oral)   Resp 18   SpO2 99%  Gen:   Awake, no distress   Resp:  Normal effort  MSK:   Moves extremities without difficulty  Other:  Abdomen distended with generalized tenderness, mass appreciated above umbilicus with increased tenderness, no overlying skin changes or obvious hernia  Medical Decision Making  Medically screening exam initiated at 10:19 AM.  Appropriate orders placed.  Joseph Ray was informed that the remainder of the evaluation will be completed by another provider, this initial triage assessment does not replace that evaluation, and the importance of remaining in the ED until their evaluation is complete.     Jeanella Flattery 11/23/20 1025    Wynetta Fines, MD 11/23/20 1443

## 2021-08-13 ENCOUNTER — Emergency Department (HOSPITAL_COMMUNITY): Payer: Self-pay

## 2021-08-13 ENCOUNTER — Encounter (HOSPITAL_COMMUNITY): Payer: Self-pay

## 2021-08-13 ENCOUNTER — Inpatient Hospital Stay (HOSPITAL_COMMUNITY)
Admission: EM | Admit: 2021-08-13 | Discharge: 2021-08-16 | DRG: 280 | Disposition: A | Discharge status: Home / Self Care | Payer: Self-pay | Attending: Internal Medicine | Admitting: Internal Medicine

## 2021-08-13 ENCOUNTER — Other Ambulatory Visit: Payer: Self-pay

## 2021-08-13 ENCOUNTER — Other Ambulatory Visit (HOSPITAL_COMMUNITY): Payer: Self-pay

## 2021-08-13 DIAGNOSIS — I5023 Acute on chronic systolic (congestive) heart failure: Secondary | ICD-10-CM | POA: Diagnosis present

## 2021-08-13 DIAGNOSIS — I428 Other cardiomyopathies: Secondary | ICD-10-CM | POA: Diagnosis present

## 2021-08-13 DIAGNOSIS — E669 Obesity, unspecified: Secondary | ICD-10-CM | POA: Diagnosis present

## 2021-08-13 DIAGNOSIS — I160 Hypertensive urgency: Secondary | ICD-10-CM

## 2021-08-13 DIAGNOSIS — I10 Essential (primary) hypertension: Secondary | ICD-10-CM

## 2021-08-13 DIAGNOSIS — Z8249 Family history of ischemic heart disease and other diseases of the circulatory system: Secondary | ICD-10-CM

## 2021-08-13 DIAGNOSIS — Z91141 Patient's other noncompliance with medication regimen due to financial hardship: Secondary | ICD-10-CM

## 2021-08-13 DIAGNOSIS — Z79899 Other long term (current) drug therapy: Secondary | ICD-10-CM

## 2021-08-13 DIAGNOSIS — E1165 Type 2 diabetes mellitus with hyperglycemia: Secondary | ICD-10-CM | POA: Diagnosis present

## 2021-08-13 DIAGNOSIS — R778 Other specified abnormalities of plasma proteins: Secondary | ICD-10-CM

## 2021-08-13 DIAGNOSIS — I161 Hypertensive emergency: Secondary | ICD-10-CM | POA: Diagnosis present

## 2021-08-13 DIAGNOSIS — Z6838 Body mass index (BMI) 38.0-38.9, adult: Secondary | ICD-10-CM

## 2021-08-13 DIAGNOSIS — Z880 Allergy status to penicillin: Secondary | ICD-10-CM

## 2021-08-13 DIAGNOSIS — R072 Precordial pain: Secondary | ICD-10-CM

## 2021-08-13 DIAGNOSIS — I110 Hypertensive heart disease with heart failure: Secondary | ICD-10-CM | POA: Diagnosis present

## 2021-08-13 DIAGNOSIS — I119 Hypertensive heart disease without heart failure: Principal | ICD-10-CM

## 2021-08-13 DIAGNOSIS — F17210 Nicotine dependence, cigarettes, uncomplicated: Secondary | ICD-10-CM | POA: Diagnosis present

## 2021-08-13 DIAGNOSIS — I5043 Acute on chronic combined systolic (congestive) and diastolic (congestive) heart failure: Secondary | ICD-10-CM

## 2021-08-13 DIAGNOSIS — Z8616 Personal history of COVID-19: Secondary | ICD-10-CM

## 2021-08-13 DIAGNOSIS — I77810 Thoracic aortic ectasia: Secondary | ICD-10-CM

## 2021-08-13 DIAGNOSIS — I214 Non-ST elevation (NSTEMI) myocardial infarction: Principal | ICD-10-CM | POA: Diagnosis present

## 2021-08-13 DIAGNOSIS — Z560 Unemployment, unspecified: Secondary | ICD-10-CM

## 2021-08-13 HISTORY — DX: Hypertensive heart disease without heart failure: I11.9

## 2021-08-13 LAB — HEMOGLOBIN A1C
Hgb A1c MFr Bld: 6 % — ABNORMAL HIGH (ref 4.8–5.6)
Mean Plasma Glucose: 125.5 mg/dL

## 2021-08-13 LAB — CBC WITH DIFFERENTIAL/PLATELET
Abs Immature Granulocytes: 0.02 10*3/uL (ref 0.00–0.07)
Basophils Absolute: 0 10*3/uL (ref 0.0–0.1)
Basophils Relative: 1 %
Eosinophils Absolute: 0.1 10*3/uL (ref 0.0–0.5)
Eosinophils Relative: 2 %
HCT: 43.1 % (ref 39.0–52.0)
Hemoglobin: 14.7 g/dL (ref 13.0–17.0)
Immature Granulocytes: 0 %
Lymphocytes Relative: 32 %
Lymphs Abs: 1.7 10*3/uL (ref 0.7–4.0)
MCH: 30.9 pg (ref 26.0–34.0)
MCHC: 34.1 g/dL (ref 30.0–36.0)
MCV: 90.7 fL (ref 80.0–100.0)
Monocytes Absolute: 0.6 10*3/uL (ref 0.1–1.0)
Monocytes Relative: 11 %
Neutro Abs: 2.7 10*3/uL (ref 1.7–7.7)
Neutrophils Relative %: 54 %
Platelets: 240 10*3/uL (ref 150–400)
RBC: 4.75 MIL/uL (ref 4.22–5.81)
RDW: 13.8 % (ref 11.5–15.5)
WBC: 5.1 10*3/uL (ref 4.0–10.5)
nRBC: 0 % (ref 0.0–0.2)

## 2021-08-13 LAB — COMPREHENSIVE METABOLIC PANEL
ALT: 23 U/L (ref 0–44)
AST: 22 U/L (ref 15–41)
Albumin: 4.1 g/dL (ref 3.5–5.0)
Alkaline Phosphatase: 38 U/L (ref 38–126)
Anion gap: 8 (ref 5–15)
BUN: 12 mg/dL (ref 6–20)
CO2: 25 mmol/L (ref 22–32)
Calcium: 9.4 mg/dL (ref 8.9–10.3)
Chloride: 107 mmol/L (ref 98–111)
Creatinine, Ser: 1.11 mg/dL (ref 0.61–1.24)
GFR, Estimated: >60 mL/min (ref >60)
Glucose, Bld: 127 mg/dL — ABNORMAL HIGH (ref 70–99)
Potassium: 3.7 mmol/L (ref 3.5–5.1)
Sodium: 140 mmol/L (ref 135–145)
Total Bilirubin: 0.7 mg/dL (ref 0.3–1.2)
Total Protein: 7.9 g/dL (ref 6.5–8.1)

## 2021-08-13 LAB — TROPONIN I (HIGH SENSITIVITY)
Troponin I (High Sensitivity): 59 ng/L — ABNORMAL HIGH (ref <18)
Troponin I (High Sensitivity): 164 ng/L (ref <18)
Troponin I (High Sensitivity): 383 ng/L (ref <18)
Troponin I (High Sensitivity): 569 ng/L (ref <18)

## 2021-08-13 LAB — PROTIME-INR
INR: 1 (ref 0.8–1.2)
Prothrombin Time: 13.3 seconds (ref 11.4–15.2)

## 2021-08-13 LAB — BRAIN NATRIURETIC PEPTIDE: B Natriuretic Peptide: 131.6 pg/mL — ABNORMAL HIGH (ref 0.0–100.0)

## 2021-08-13 LAB — HEPARIN LEVEL (UNFRACTIONATED): Heparin Unfractionated: 0.19 IU/mL — ABNORMAL LOW (ref 0.30–0.70)

## 2021-08-13 LAB — APTT: aPTT: 26 seconds (ref 24–36)

## 2021-08-13 MED ORDER — ONDANSETRON HCL 4 MG/2ML IJ SOLN
4.0000 mg | Freq: Once | INTRAMUSCULAR | Status: AC
  Administered 2021-08-13: 4 mg via INTRAVENOUS
  Filled 2021-08-13: qty 2

## 2021-08-13 MED ORDER — POTASSIUM CHLORIDE CRYS ER 20 MEQ PO TBCR
30.0000 meq | EXTENDED_RELEASE_TABLET | Freq: Once | ORAL | Status: AC
  Administered 2021-08-13: 30 meq via ORAL
  Filled 2021-08-13: qty 1

## 2021-08-13 MED ORDER — METOPROLOL TARTRATE 25 MG PO TABS
100.0000 mg | ORAL_TABLET | Freq: Once | ORAL | Status: AC
  Administered 2021-08-13: 100 mg via ORAL
  Filled 2021-08-13: qty 4

## 2021-08-13 MED ORDER — FUROSEMIDE 40 MG PO TABS
40.0000 mg | ORAL_TABLET | Freq: Every day | ORAL | 0 refills | Status: DC
  Filled 2021-08-13: qty 30, 30d supply, fill #0

## 2021-08-13 MED ORDER — ISOSORBIDE DINITRATE 10 MG PO TABS
10.0000 mg | ORAL_TABLET | Freq: Three times a day (TID) | ORAL | Status: DC
  Administered 2021-08-13 – 2021-08-16 (×9): 10 mg via ORAL
  Filled 2021-08-13 (×13): qty 1

## 2021-08-13 MED ORDER — HEPARIN (PORCINE) 25000 UT/250ML-% IV SOLN
2100.0000 [IU]/h | INTRAVENOUS | Status: DC
  Administered 2021-08-13: 1350 [IU]/h via INTRAVENOUS
  Administered 2021-08-14: 1500 [IU]/h via INTRAVENOUS
  Administered 2021-08-14: 1750 [IU]/h via INTRAVENOUS
  Administered 2021-08-15: 1950 [IU]/h via INTRAVENOUS
  Administered 2021-08-15: 2100 [IU]/h via INTRAVENOUS
  Filled 2021-08-13 (×6): qty 250

## 2021-08-13 MED ORDER — NITROGLYCERIN 0.4 MG SL SUBL
0.4000 mg | SUBLINGUAL_TABLET | SUBLINGUAL | Status: DC | PRN
  Administered 2021-08-13 – 2021-08-14 (×5): 0.4 mg via SUBLINGUAL
  Filled 2021-08-13: qty 1

## 2021-08-13 MED ORDER — CARVEDILOL 12.5 MG PO TABS: 12.5000 mg | ORAL_TABLET | Freq: Once | ORAL | Status: DC

## 2021-08-13 MED ORDER — FUROSEMIDE 10 MG/ML IJ SOLN
40.0000 mg | Freq: Once | INTRAMUSCULAR | Status: AC
  Administered 2021-08-13: 40 mg via INTRAVENOUS
  Filled 2021-08-13: qty 4

## 2021-08-13 MED ORDER — NITROGLYCERIN 0.4 MG SL SUBL
0.4000 mg | SUBLINGUAL_TABLET | SUBLINGUAL | Status: DC | PRN
  Administered 2021-08-13: 0.4 mg via SUBLINGUAL
  Filled 2021-08-13 (×2): qty 1

## 2021-08-13 MED ORDER — METOPROLOL TARTRATE 100 MG PO TABS
100.0000 mg | ORAL_TABLET | Freq: Two times a day (BID) | ORAL | 0 refills | Status: DC
  Filled 2021-08-13: qty 60, 30d supply, fill #0

## 2021-08-13 MED ORDER — ASPIRIN 81 MG PO CHEW
324.0000 mg | CHEWABLE_TABLET | Freq: Once | ORAL | Status: AC
  Administered 2021-08-13: 324 mg via ORAL
  Filled 2021-08-13: qty 4

## 2021-08-13 MED ORDER — ONDANSETRON HCL 4 MG/2ML IJ SOLN: 4.0000 mg | Freq: Four times a day (QID) | INTRAMUSCULAR | Status: DC | PRN

## 2021-08-13 MED ORDER — HEPARIN BOLUS VIA INFUSION
4000.0000 [IU] | Freq: Once | INTRAVENOUS | Status: AC
  Administered 2021-08-13: 4000 [IU] via INTRAVENOUS
  Filled 2021-08-13: qty 4000

## 2021-08-13 MED ORDER — HEPARIN BOLUS VIA INFUSION
2000.0000 [IU] | Freq: Once | INTRAVENOUS | Status: AC
  Administered 2021-08-13: 2000 [IU] via INTRAVENOUS
  Filled 2021-08-13: qty 2000

## 2021-08-13 MED ORDER — LISINOPRIL 10 MG PO TABS
20.0000 mg | ORAL_TABLET | Freq: Once | ORAL | Status: AC
  Administered 2021-08-13: 20 mg via ORAL
  Filled 2021-08-13: qty 2

## 2021-08-13 MED ORDER — ASPIRIN 81 MG PO CHEW: 324.0000 mg | CHEWABLE_TABLET | ORAL | Status: AC

## 2021-08-13 MED ORDER — HYDRALAZINE HCL 25 MG PO TABS
25.0000 mg | ORAL_TABLET | Freq: Three times a day (TID) | ORAL | Status: DC
  Administered 2021-08-13 – 2021-08-16 (×9): 25 mg via ORAL
  Filled 2021-08-13 (×9): qty 1

## 2021-08-13 MED ORDER — HYDRALAZINE HCL 25 MG PO TABS
25.0000 mg | ORAL_TABLET | Freq: Four times a day (QID) | ORAL | Status: DC | PRN
  Administered 2021-08-13: 25 mg via ORAL

## 2021-08-13 MED ORDER — ASPIRIN 81 MG PO TBEC
81.0000 mg | DELAYED_RELEASE_TABLET | Freq: Every day | ORAL | Status: DC
Start: 2021-08-14 — End: 2021-08-16
  Administered 2021-08-14 – 2021-08-15 (×2): 81 mg via ORAL
  Filled 2021-08-13 (×3): qty 1

## 2021-08-13 MED ORDER — ASPIRIN 300 MG RE SUPP: 300.0000 mg | RECTAL | Status: AC

## 2021-08-13 MED ORDER — MORPHINE SULFATE (PF) 4 MG/ML IV SOLN
4.0000 mg | Freq: Once | INTRAVENOUS | Status: AC
  Administered 2021-08-13: 4 mg via INTRAVENOUS
  Filled 2021-08-13: qty 1

## 2021-08-13 MED ORDER — LISINOPRIL 20 MG PO TABS
20.0000 mg | ORAL_TABLET | Freq: Every day | ORAL | 0 refills | Status: DC
  Filled 2021-08-13: qty 30, 30d supply, fill #0

## 2021-08-13 MED ORDER — ACETAMINOPHEN 325 MG PO TABS
650.0000 mg | ORAL_TABLET | ORAL | Status: DC | PRN
Start: 2021-08-13 — End: 2021-08-16

## 2021-08-13 MED ORDER — CARVEDILOL 3.125 MG PO TABS
3.1250 mg | ORAL_TABLET | Freq: Two times a day (BID) | ORAL | Status: DC
  Administered 2021-08-13: 3.125 mg via ORAL
  Filled 2021-08-13: qty 1

## 2021-08-13 NOTE — Progress Notes (Signed)
ANTICOAGULATION CONSULT NOTE - Initial Consult  Pharmacy Consult for heparin Indication: chest pain/ACS  Patient Measurements: Height: 6\' 3"  (190.5 cm) Weight: (!) 138.3 kg (305 lb) IBW/kg (Calculated) : 84.5 Heparin Dosing Weight: 115 kg  Vital Signs: Temp: 98.2 F (36.8 C) (06/02 0746) Temp Source: Oral (06/02 0746) BP: 138/100 (06/02 1330) Pulse Rate: 74 (06/02 1345)  Labs: Recent Labs    08/13/21 0800 08/13/21 1048  HGB 14.7  --   HCT 43.1  --   PLT 240  --   CREATININE 1.11  --   TROPONINIHS 59* 164*    Estimated Creatinine Clearance: 126 mL/min (by C-G formula based on SCr of 1.11 mg/dL).   Medical History: Past Medical History:  Diagnosis Date   Achilles tendon tear    left   CHF (congestive heart failure) (HCC)    Hypertension    Snores     Medications:  Scheduled:   heparin  4,000 Units Intravenous Once    Assessment: 46 yo male presenting with chest tightness and shortness of breath. Troponins increased from 59 >> 164.  EKG with left ventricular hypertrophy and T wave inversion and mild ST elevation. No AC PTA. Pharmacy is consulted to dose heparin drip for ACS.  Hgb 14.7, platelets 240  Goal of Therapy:  Heparin level 0.3-0.7 units/ml Monitor platelets by anticoagulation protocol: Yes   Plan:  Baseline aPTT and INR stat Heparin 4000 units IV x1  Begin heparin infusion at 1350 units/hr Check heparin level in 6 hours Daily heparin level and CBC F/u cardiology recommendations   54, PharmD 08/13/2021 2:41 PM

## 2021-08-13 NOTE — Progress Notes (Signed)
ANTICOAGULATION CONSULT NOTE - Initial Consult  Pharmacy Consult for heparin Indication: chest pain/ACS  Patient Measurements: Height: 6\' 3"  (190.5 cm) Weight: (!) 138.3 kg (305 lb) IBW/kg (Calculated) : 84.5 Heparin Dosing Weight: 115 kg  Vital Signs: Temp: 97.9 F (36.6 C) (06/02 1830) Temp Source: Oral (06/02 1830) BP: 128/90 (06/02 1851) Pulse Rate: 84 (06/02 1851)  Labs: Recent Labs    08/13/21 0800 08/13/21 1048 08/13/21 1346 08/13/21 1502 08/13/21 1541 08/13/21 2030  HGB 14.7  --   --   --   --   --   HCT 43.1  --   --   --   --   --   PLT 240  --   --   --   --   --   APTT  --   --   --  26  --   --   LABPROT  --   --   --  13.3  --   --   INR  --   --   --  1.0  --   --   HEPARINUNFRC  --   --   --   --   --  0.19*  CREATININE 1.11  --   --   --   --   --   TROPONINIHS 59* 164* 383*  --  569*  --      Estimated Creatinine Clearance: 126 mL/min (by C-G formula based on SCr of 1.11 mg/dL).   Medical History: Past Medical History:  Diagnosis Date   Achilles tendon tear    left   CHF (congestive heart failure) (HCC)    Hypertension    Snores     Medications:  Scheduled:   aspirin  324 mg Oral NOW   Or   aspirin  300 mg Rectal NOW   [START ON 08/14/2021] aspirin EC  81 mg Oral Daily   carvedilol  3.125 mg Oral BID WC   hydrALAZINE  25 mg Oral TID   isosorbide dinitrate  10 mg Oral TID    Assessment: 45 yo male presenting with chest tightness and shortness of breath. Troponins increased from 59 >> 164.  EKG with left ventricular hypertrophy and T wave inversion and mild ST elevation. No AC PTA. Pharmacy is consulted to dose heparin drip for ACS.  Hgb 14.7, platelets 240  Today, 08/13/21 - Heparin level subtherapeutic at 0.19  - No issues with heparin infusing and no s/sx bleeding per RN  Goal of Therapy:  Heparin level 0.3-0.7 units/ml Monitor platelets by anticoagulation protocol: Yes   Plan:  Heparin 2000 units bolus x1 Increase heparin  infusion to 1500 units/hr Check heparin level in 6 hours Daily heparin level and CBC F/u cardiology recommendations  10/13/21, PharmD 08/13/2021 8:50 PM

## 2021-08-13 NOTE — Discharge Planning (Signed)
  [facility] Medication Assistance Card Name: Joseph Ray ID (MRN): [id number]  Bin: [id number] RX Group: [id number] Discharge Date: 08/13/2021 Expiration Date:08/21/2021                                           (must be filled within 7 days of discharge)     You have been approved to have the prescriptions written by your discharging physician filled through our [facility] (Medication Assistance Through [facility]) program. This program allows for a one-time (no refills) 34-day supply of selected medications for a low copay amount.  The copay is $0.00 per prescription. Only certain pharmacies are participating in this program with [facility]. You will need to select one of the pharmacies from the attached list and take your prescriptions, this letter, and your photo ID to one of the [facility] pharmacies, [facility], [facility] at [address] or [facility] [address].   We are excited that you are able to use the [facility] program to get your medications. These prescriptions must be filled within 7 days of hospital discharge or they will no longer be valid for the [facility] program. Should you have any problems with your prescriptions please contact your case management team member at [phone] for [place].  Thank you, [facility]

## 2021-08-13 NOTE — ED Provider Notes (Signed)
[facility]-EMERGENCY DEPT Provider Note   CSN: [id number] Arrival date & time: 08/13/21  0730     History  Chief Complaint  Patient presents with   Shortness of Breath    Joseph Ray is a 46 y.o. male.  Patient is a 46 year old male with a history of CHF, cardiomyopathy, hypertension, diabetes who is presenting today with complaints of chest tightness and shortness of breath.  Patient reports that he lost his job in February and has been out of all medications since March.  He has been doing okay until Wednesday he was doing cleaning in his house and started noticing by noon he was having a chest pressure and some shortness of breath with exertion.  Yesterday he noticed when he took his regular walk that he was more winded than normal and last night when he was laying down trying to sleep he woke up every hour feeling short of breath.  This morning when he was walking down with his son and his arms he noticed that he was winded with just short distance and was still having the chest pressure which has been constant for the last 2 days.  He told his wife who told him he needed to come be checked out.  He has not noticed any swelling in his legs or abdomen.  He denies any shortness of breath at rest.  He does have chest tightness at rest.  He has no cough, congestion, fever, abdominal pain, nausea vomiting or diarrhea.  No tobacco use, last marijuana use was 2 months ago.  No regular alcohol use.  The history is provided by the patient and medical records.  Shortness of Breath     Home Medications Prior to Admission medications   Medication Sig Start Date End Date Taking? Authorizing Provider  furosemide (LASIX) 40 MG tablet Take 1 tablet (40 mg total) by mouth daily. 08/13/21  Yes Ashawnti Tangen, Alphonzo Lemmings, MD  lisinopril (ZESTRIL) 20 MG tablet Take 1 tablet (20 mg total) by mouth daily. 08/13/21  Yes Gwyneth Sprout, MD  metoprolol tartrate (LOPRESSOR) 100 MG tablet Take 1  tablet (100 mg total) by mouth 2 (two) times daily. 08/13/21  Yes Gwyneth Sprout, MD  ascorbic acid (VITAMIN C) 500 MG tablet Take 1 tablet (500 mg total) by mouth daily. Patient not taking: Reported on 08/06/2020 03/14/20   Alwyn Ren, MD  carvedilol (COREG) 12.5 MG tablet Take 1 tablet (12.5 mg total) by mouth 2 (two) times daily with a meal. 08/06/20 09/05/20  Jeannie Fend, PA-C  sacubitril-valsartan (ENTRESTO) 24-26 MG Take 1 tablet by mouth 2 (two) times daily. Patient not taking: Reported on 08/06/2020 03/13/20   Alwyn Ren, MD  zinc sulfate 220 (50 Zn) MG capsule Take 1 capsule (220 mg total) by mouth daily. Patient not taking: Reported on 08/06/2020 03/14/20   Alwyn Ren, MD      Allergies    Penicillins    Review of Systems   Review of Systems  Respiratory:  Positive for shortness of breath.    Physical Exam Updated Vital Signs BP (!) 138/100   Pulse 74   Temp 98.2 F (36.8 C) (Oral)   Resp 18   Ht 6\' 3"  (1.905 m)   Wt (!) 138.3 kg   SpO2 97%   BMI 38.12 kg/m  Physical Exam Vitals and nursing note reviewed.  Constitutional:      General: He is not in acute distress.    Appearance: He is  well-developed.  HENT:     Head: Normocephalic and atraumatic.  Eyes:     Conjunctiva/sclera: Conjunctivae normal.     Pupils: Pupils are equal, round, and reactive to light.  Cardiovascular:     Rate and Rhythm: Normal rate and regular rhythm.     Pulses: Normal pulses.     Heart sounds: No murmur heard. Pulmonary:     Effort: Pulmonary effort is normal. No respiratory distress.     Breath sounds: Normal breath sounds. No wheezing or rales.  Abdominal:     General: There is no distension.     Palpations: Abdomen is soft.     Tenderness: There is no abdominal tenderness. There is no guarding or rebound.  Musculoskeletal:        General: No tenderness. Normal range of motion.     Cervical back: Normal range of motion and neck supple.     Right lower  leg: No edema.     Left lower leg: No edema.  Skin:    General: Skin is warm and dry.     Findings: No erythema or rash.  Neurological:     Mental Status: He is alert and oriented to person, place, and time. Mental status is at baseline.  Psychiatric:        Mood and Affect: Mood normal.        Behavior: Behavior normal.    ED Results / Procedures / Treatments   Labs (all labs ordered are listed, but only abnormal results are displayed) Labs Reviewed  COMPREHENSIVE METABOLIC PANEL - Abnormal; Notable for the following components:      Result Value   Glucose, Bld 127 (*)    All other components within normal limits  BRAIN NATRIURETIC PEPTIDE - Abnormal; Notable for the following components:   B Natriuretic Peptide 131.6 (*)    All other components within normal limits  TROPONIN I (HIGH SENSITIVITY) - Abnormal; Notable for the following components:   Troponin I (High Sensitivity) 59 (*)    All other components within normal limits  TROPONIN I (HIGH SENSITIVITY) - Abnormal; Notable for the following components:   Troponin I (High Sensitivity) 164 (*)    All other components within normal limits  CBC WITH DIFFERENTIAL/PLATELET  TROPONIN I (HIGH SENSITIVITY)    EKG EKG Interpretation  Date/Time:  Friday August 13 2021 07:48:14 EDT Ventricular Rate:  95 PR Interval:  183 QRS Duration: 108 QT Interval:  370 QTC Calculation: 466 R Axis:   -19 Text Interpretation: Sinus rhythm Left ventricular hypertrophy Abnormal T, consider ischemia, lateral leads ST elevation, consider anterior injury No significant change since last tracing Confirmed by Gwyneth Sprout ([id number]) on 08/13/2021 8:16:52 AM  Radiology DG Chest Port 1 View  Result Date: 08/13/2021 CLINICAL DATA:  Dyspnea with exertion, chest pain. EXAM: PORTABLE CHEST 1 VIEW COMPARISON:  June 13, 2020. FINDINGS: Stable cardiomediastinal silhouette. Both lungs are clear. The visualized skeletal structures are unremarkable. IMPRESSION:  No active disease. Electronically Signed   By: Lupita Raider M.D.   On: 08/13/2021 08:58    Procedures Procedures    Medications Ordered in ED Medications  nitroGLYCERIN (NITROSTAT) SL tablet 0.4 mg (0.4 mg Sublingual Given 08/13/21 1232)  aspirin chewable tablet 324 mg (324 mg Oral Given 08/13/21 0833)  lisinopril (ZESTRIL) tablet 20 mg (20 mg Oral Given 08/13/21 0833)  metoprolol tartrate (LOPRESSOR) tablet 100 mg (100 mg Oral Given 08/13/21 0833)  furosemide (LASIX) injection 40 mg (40 mg Intravenous Given 08/13/21 0947)  morphine (PF) 4 MG/ML injection 4 mg (4 mg Intravenous Given 08/13/21 1427)  ondansetron (ZOFRAN) injection 4 mg (4 mg Intravenous Given 08/13/21 1427)    ED Course/ Medical Decision Making/ A&P                           Medical Decision Making Amount and/or Complexity of Data Reviewed Labs: ordered. Radiology: ordered.  Risk OTC drugs. Prescription drug management.   Pt with multiple medical problems and comorbidities and presenting today with a complaint that caries a high risk for morbidity and mortality.  Presenting today with chest pain and shortness of breath.  Patient's last echo in 2021 showed an EF of 30 to 35%.  He had been on metoprolol, lisinopril and Lasix but has been out of those medications since March due to losing his job.  He tried to call the doctor's office but never got through to anybody.  Concern patient symptoms today are related to CHF exacerbation and heart strain.  Possibility for an MI.  Lower suspicion for.  Cardial effusion or tamponade.  No symptoms to suggest pneumonia or acute infectious process.  EKG and labs are pending.  Patient is extremely hypertensive here today with a blood pressure of 176/135.  Patient given a dose of home medications and aspirin.  2:32 PM I independently interpreted patient's EKG and he has evidence of left ventricular hypertrophy with T wave inversion inferolaterally and mild ST elevation in V2 and V3 which is  unchanged from prior EKGs.  Patient's initial troponin was 59.  CBC within normal limits, CMP without acute findings and BNP elevated at 131 which is better than the last rate a few years ago which was in the 200s.  I have independently visualized and interpreted pt's images today.  Chest x-ray without evidence of significant fluid overload.  After blood pressure medication his blood pressure has improved to 146/113.  Rest of vital signs remained stable.  On repeat evaluation patient reports he still has some mild discomfort in his chest.  Delta troponin has increased to 164.  EKG is unchanged from earlier.  Concern for possible ACS or ongoing strain with hypertensive urgency.  Patient did have a CTA of his chest in September of last year with these findings  Calcium score of 32.  This places the patient in the 87 percentile for age and race based on the [place] data base.  -  No significant CAD noted but all 3 major coronary arteries are larger in diameter than usual. 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 Mid section of the RCA has a narrow segment at the angle.  - Mildly dilated ascending aorta . We will consult cardiology for further recommendations.  We will discuss if they feel patient needs heparin.  2:32 PM Audiology is coming to evaluate the patient however he has suddenly developed significant pressure in the left side of his chest making his left arm feel unusual.  This is the first time throughout his stay that he is appeared uncomfortable.  Repeat EKG is unchanged from earlier.  Patient reported minimal improvement with nitroglycerin earlier.  Will give pain medication.         Final Clinical Impression(s) / ED Diagnoses Final diagnoses:  Hypertensive urgency  NSTEMI (non-ST elevated myocardial infarction) (HCC)    Rx / DC Orders ED Discharge Orders          Ordered    lisinopril (ZESTRIL)  20 MG tablet  Daily        08/13/21 1019    metoprolol tartrate  (LOPRESSOR) 100 MG tablet  2 times daily        08/13/21 1019    furosemide (LASIX) 40 MG tablet  Daily        08/13/21 1019              Gwyneth Sprout, MD 08/13/21 1432

## 2021-08-13 NOTE — H&P (Signed)
History and Physical    Joseph Ray PTE:[id number] DOB: 1975/07/10 DOA: 08/13/2021  PCP: [facility] (Confirm with patient/family/NH records and if not entered, this has to be entered at [facility] point of entry) Patient coming from: Home  I have personally briefly reviewed patient's old medical records in [facility]  Chief Complaint: Chest pain, SOB  HPI: Joseph Ray is a 46 y.o. 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  Patient has been compliant with his CHF medication including Entresto until February 2023 when patient lost his job and insurance.  He has not taking any of his CHF/BP medication since mid March.  Sometime last week, patient started to have exertional dyspnea gradually getting worse, > any cough, no orthopnea.  Yesterday around noon time, she started to feel episodes of squeezing-like chest pain, 08-18-08, triggered by exertions, relieved by sitting down and resting.  He had another episode this morning of pressure-like chest pain and decided to come in.  ED Course: Pressure significant elevated 176/135, EKG showed no acute ST changes but chronic T wave inversions on lateral leads.  Troponins trending up 59> 164> 383.  X-ray showed no acute infiltrates.  Patient was started on heparin drip and multiple BP meds  Review of Systems: As per HPI otherwise 14 point review of systems negative.    Past Medical History:  Diagnosis Date   Achilles tendon tear    left   CHF (congestive heart failure) (HCC)    Hypertension    Snores     Past Surgical History:  Procedure Laterality Date   ACHILLES TENDON SURGERY Left 03/25/2013   Procedure: LEFT ACHILLES TENDON REPAIR;  Surgeon: [name], MD;  Location: [facility];  Service: Orthopedics;  Laterality: Left;      reports that he has been smoking cigarettes. He has been smoking an average of .15 packs per day. He has never used smokeless tobacco. He reports current alcohol use. He reports that he does not use drugs.  Allergies  Allergen Reactions   Penicillins Rash    Has patient had a PCN reaction causing immediate rash, facial/tongue/throat swelling, SOB or lightheadedness with hypotension: No Has patient had a PCN reaction causing severe rash involving mucus membranes or skin necrosis: No Has patient had a PCN reaction that required hospitalization No Has patient had a PCN reaction occurring within the last 10 years: No If all of the above answers are "NO", then may proceed with Cephalosporin     Family History  Problem Relation Age of Onset   Hypertension Mother    Hypertension Father      Prior to Admission medications   Medication Sig Start Date End Date Taking? Authorizing Provider  furosemide (LASIX) 40 MG tablet Take 1 tablet (40 mg total) by mouth daily. 08/13/21  Yes Plunkett, Alphonzo Lemmings, MD  lisinopril (ZESTRIL) 20 MG tablet Take 1 tablet (20 mg total) by mouth daily. 08/13/21  Yes Gwyneth Sprout, MD  metoprolol tartrate (LOPRESSOR) 100 MG tablet Take 1 tablet (100 mg total) by mouth 2 (two) times daily. 08/13/21  Yes Gwyneth Sprout, MD  ascorbic acid (VITAMIN C) 500 MG tablet Take 1 tablet (500 mg total) by mouth daily. Patient not taking: Reported on 08/06/2020 03/14/20   Alwyn Ren, MD  carvedilol (COREG) 12.5 MG tablet Take 1 tablet (12.5  mg total) by mouth 2 (two) times daily with a meal. 08/06/20 09/05/20  Jeannie Fend, PA-C  sacubitril-valsartan (ENTRESTO) 24-26 MG Take 1 tablet by mouth 2 (two) times daily. Patient not taking: Reported on 08/06/2020 03/13/20   Alwyn Ren, MD  zinc sulfate 220 (50 Zn) MG capsule Take 1 capsule (220 mg total) by mouth daily. Patient not taking: Reported on 08/06/2020 03/14/20   Alwyn Ren, MD    Physical Exam: Vitals:    08/13/21 1400 08/13/21 1415 08/13/21 1430 08/13/21 1500  BP: (!) 143/109  (!) 152/123 (!) 148/112  Pulse: 74 83 77 66  Resp: (!) 24 (!) 25 (!) 23 16  Temp:      TempSrc:      SpO2: 97% 98% 98% 95%  Weight:      Height:        Constitutional: NAD, calm, comfortable Vitals:   08/13/21 1400 08/13/21 1415 08/13/21 1430 08/13/21 1500  BP: (!) 143/109  (!) 152/123 (!) 148/112  Pulse: 74 83 77 66  Resp: (!) 24 (!) 25 (!) 23 16  Temp:      TempSrc:      SpO2: 97% 98% 98% 95%  Weight:      Height:       Eyes: PERRL, lids and conjunctivae normal ENMT: Mucous membranes are moist. Posterior pharynx clear of any exudate or lesions.Normal dentition.  Neck: normal, supple, no masses, no thyromegaly Respiratory: clear to auscultation bilaterally, no wheezing, fine crackles on bilateral mid-lower fields.  Increasing respiratory effort. No accessory muscle use.  Cardiovascular: Regular rate and rhythm, no murmurs / rubs / gallops.  1+ extremity edema. 2+ pedal pulses. No carotid bruits.  Abdomen: no tenderness, no masses palpated. No hepatosplenomegaly. Bowel sounds positive.  Musculoskeletal: no clubbing / cyanosis. No joint deformity upper and lower extremities. Good ROM, no contractures. Normal muscle tone.  Skin: no rashes, lesions, ulcers. No induration Neurologic: CN 2-12 grossly intact. Sensation intact, DTR normal. Strength 5/5 in all 4.  Psychiatric: Normal judgment and insight. Alert and oriented x 3. Normal mood.     Labs on Admission: I have personally reviewed following labs and imaging studies  CBC: Recent Labs  Lab 08/13/21 0800  WBC 5.1  NEUTROABS 2.7  HGB 14.7  HCT 43.1  MCV 90.7  PLT 240   Basic Metabolic Panel: Recent Labs  Lab 08/13/21 0800  NA 140  K 3.7  CL 107  CO2 25  GLUCOSE 127*  BUN 12  CREATININE 1.11  CALCIUM 9.4   GFR: Estimated Creatinine Clearance: 126 mL/min (by C-G formula based on SCr of 1.11 mg/dL). Liver Function Tests: Recent Labs   Lab 08/13/21 0800  AST 22  ALT 23  ALKPHOS 38  BILITOT 0.7  PROT 7.9  ALBUMIN 4.1   No results for input(s): LIPASE, AMYLASE in the last 168 hours. No results for input(s): AMMONIA in the last 168 hours. Coagulation Profile: Recent Labs  Lab 08/13/21 1502  INR 1.0   Cardiac Enzymes: No results for input(s): CKTOTAL, CKMB, CKMBINDEX, TROPONINI in the last 168 hours. BNP (last 3 results) No results for input(s): PROBNP in the last 8760 hours. HbA1C: No results for input(s): HGBA1C in the last 72 hours. CBG: No results for input(s): GLUCAP in the last 168 hours. Lipid Profile: No results for input(s): CHOL, HDL, LDLCALC, TRIG, CHOLHDL, LDLDIRECT in the last 72 hours. Thyroid Function Tests: No results for input(s): TSH, T4TOTAL, FREET4, T3FREE, THYROIDAB in the last 72  hours. Anemia Panel: No results for input(s): VITAMINB12, FOLATE, FERRITIN, TIBC, IRON, RETICCTPCT in the last 72 hours. Urine analysis:    Component Value Date/Time   COLORURINE YELLOW 08/06/2020 1528   APPEARANCEUR CLEAR 08/06/2020 1528   LABSPEC 1.020 08/06/2020 1528   PHURINE 5.0 08/06/2020 1528   GLUCOSEU NEGATIVE 08/06/2020 1528   HGBUR NEGATIVE 08/06/2020 1528   BILIRUBINUR NEGATIVE 08/06/2020 1528   KETONESUR NEGATIVE 08/06/2020 1528   PROTEINUR 100 (A) 08/06/2020 1528   NITRITE NEGATIVE 08/06/2020 1528   LEUKOCYTESUR NEGATIVE 08/06/2020 1528    Radiological Exams on Admission: DG Chest Port 1 View  Result Date: 08/13/2021 CLINICAL DATA:  Dyspnea with exertion, chest pain. EXAM: PORTABLE CHEST 1 VIEW COMPARISON:  June 13, 2020. FINDINGS: Stable cardiomediastinal silhouette. Both lungs are clear. The visualized skeletal structures are unremarkable. IMPRESSION: No active disease. Electronically Signed   By: Lupita Raider M.D.   On: 08/13/2021 08:58    EKG: Independently reviewed.  Sinus, chronic T wave inversions on lateral leads  Assessment/Plan Principal Problem:   NSTEMI (non-ST elevated  myocardial infarction) (HCC) Active Problems:   Malignant HTN with heart disease, w/o CHF, 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  (please populate well all problems here in Problem List. (For example, if patient is on BP meds at home and you resume or decide to hold them, it is a problem that needs to be her. Same for CAD, COPD, HLD and so on)  NSTEMI -Secondary to HTN emergency -Review recent results showed the patient had a abnormal calcium score on last year's coronary CT angiogram however no significant stenosis identified. -Patient received 1 dose of metoprolol in the ED, as per cardiology no more beta-blocker today. -Aggressive BP control, cardiology added hydralazine.  We will add as needed hydralazine for BP 150/100 -Aspirin and Statin -ACEI/ARB tomorrow as per cardiology -Other Ddx, bilateral pulses are equal and chest pains are intermittent associated with exertion unlikely dissection, no indication for CT angiogram. No significant risk factors for PE.  HTN emergency -Secondary to noncoherent with BP meds -BP control regimen as above  Acute on chronic systolic CHF decompensation -Agree with diuresis -Daily weight -Fluid restriction  Elevated glucose -Check A1c, low threshold to start glucose control medication for risk modification.  Obesity -Planned calorie control and given the significant cardiac history, recommend outpatient PCP to discuss about bariatric surgery/more aggressive weight reduction measures. -Outpatient sleep study to rule out OSA  DVT prophylaxis: Heparin drip Code Status: Full code Family Communication: None at bedside Disposition Plan: Patient sick with NSTEMI, will need heparin drip for at least 48 hours, expect more than 2 midnight hospital stay. Consults called: Cardiology Admission status: Telemetry admission   Emeline General MD [facility] Pager [phone]  08/13/2021, 3:55 PM

## 2021-08-13 NOTE — ED Notes (Signed)
Attending Zhang MD notified of pt troponin 569, EKG ordered and obtained . Will continue to monitor

## 2021-08-13 NOTE — ED Notes (Signed)
Troponin critical lab value, reported to W.Plunkett  Troponin 383

## 2021-08-13 NOTE — ED Notes (Signed)
Pt O2 dropping into 80s while asleep. Hx sleep apnea. Provider aware.

## 2021-08-13 NOTE — ED Notes (Signed)
Secretary to page attending.  

## 2021-08-13 NOTE — Consult Note (Signed)
Cardiology Consultation:   Patient ID: Joseph Ray MRN: [id number]; DOB: April 24, 1975  Admit date: 08/13/2021 Date of Consult: 08/13/2021  PCP:  [facility]   [facility] Providers Cardiologist:  Jodelle Red, MD (prior) Most recent [facility] cardiology.   Patient Profile:   Joseph Ray is a 46 y.o. male with a hx of chronic systolic heart failure, hypertension, and DM who is being seen 08/13/2021 for the evaluation of chest pain at the request of Dr. Anitra Lauth.  History of Present Illness:   Joseph Ray was diagnosed with acute systolic heart failure 02/2020 in the setting of COVID-19 infection.  He was started on goal-directed medical therapy and was seen in follow-up with Dr. Cristal Deer 03/18/2020.  At that time he was waiting to get his new insurance card and had not obtained Entresto or carvedilol.  He was taking Lasix with improvement in swelling and shortness of breath.  He has not been seen by cardiology since that appointment.  He has had ER evaluations for abdominal pain felt related to his ventral hernia.  Outpatient surgery was planned but was canceled due to heart failure.  He was later seen by [facility] cardiology 08/28/2020 he will continue carvedilol and added 10 mg lisinopril.  As part of his work-up for cardiomyopathy, he underwent CT coronary 12/01/2020 that showed a calcium score of 32 placing him in the 77th percentile.  No significant CAD noted with all 3 major coronary arteries are larger in diameter than usual.  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  It does not appear he followed up with [facility] cardiology following this appointment.  He presented to [facility] ED with chest tightness and shortness of breath.  He reports that he lost his job in February and has been out of all of his medications since March.  He noticed chest pressure starting around noon on Wednesday while cleaning his house.  He also noticed dyspnea on  exertion.  Yesterday when he took his regular walk he was also more short of breath than normal.  He continued to have ongoing chest pressure with shortness of breath and presented to the ER.  He denied abdominal or lower extremity edema, and dyspnea at rest.  On presentation, he was hypertensive at 176/135.  Renal function within normal limits.   HS troponin 59 --> 164 -> 383 EKG appears with LVH and TWI laterally, stable from prior  His main issue currently is the chest pain. He describes this as mostly lateral, tight, constant. Nothing seems to make it better or worse.   He has intentionally been losing weight at home, about 15 lbs. No swelling. No syncope. No palpitations.   Past Medical History:  Diagnosis Date   Achilles tendon tear    left   CHF (congestive heart failure) (HCC)    Hypertension    Snores     Past Surgical History:  Procedure Laterality Date   ACHILLES TENDON SURGERY Left 03/25/2013   Procedure: LEFT ACHILLES TENDON REPAIR;  Surgeon: [name], MD;  Location: [facility];  Service: Orthopedics;  Laterality: Left;     Home Medications:  Prior to Admission medications   Medication Sig Start Date End Date Taking? Authorizing Provider  furosemide (LASIX) 40 MG tablet Take 1 tablet (40 mg total) by mouth daily. 08/13/21  Yes Plunkett, Alphonzo Lemmings, MD  lisinopril (ZESTRIL) 20 MG tablet Take 1 tablet (20 mg total) by mouth daily. 08/13/21  Yes Plunkett,  Whitney, MD  metoprolol tartrate (LOPRESSOR) 100 MG tablet Take 1 tablet (100 mg total) by mouth 2 (two) times daily. 08/13/21  Yes Gwyneth Sprout, MD  ascorbic acid (VITAMIN C) 500 MG tablet Take 1 tablet (500 mg total) by mouth daily. Patient not taking: Reported on 08/06/2020 03/14/20   Alwyn Ren, MD  carvedilol (COREG) 12.5 MG tablet Take 1 tablet (12.5 mg total) by mouth 2 (two) times daily with a meal. 08/06/20 09/05/20  Jeannie Fend, PA-C  sacubitril-valsartan (ENTRESTO) 24-26 MG Take 1  tablet by mouth 2 (two) times daily. Patient not taking: Reported on 08/06/2020 03/13/20   Alwyn Ren, MD  zinc sulfate 220 (50 Zn) MG capsule Take 1 capsule (220 mg total) by mouth daily. Patient not taking: Reported on 08/06/2020 03/14/20   Alwyn Ren, MD    Inpatient Medications: Scheduled Meds:  carvedilol  3.125 mg Oral BID WC   hydrALAZINE  25 mg Oral TID   isosorbide dinitrate  10 mg Oral TID   potassium chloride  30 mEq Oral Once   Continuous Infusions:  heparin 1,350 Units/hr (08/13/21 1501)   PRN Meds: hydrALAZINE, nitroGLYCERIN  Allergies:    Allergies  Allergen Reactions   Penicillins Rash        Social History:   Social History   Socioeconomic History   Marital status: Single    Spouse name: Not on file   Number of children: Not on file   Years of education: Not on file   Highest education level: Not on file  Occupational History   Not on file  Tobacco Use   Smoking status: Some Days    Packs/day: 0.15    Types: Cigarettes   Smokeless tobacco: Never  Vaping Use   Vaping Use: Never used  Substance and Sexual Activity   Alcohol use: Yes    Comment: social   Drug use: No   Sexual activity: Not on file  Other Topics Concern   Not on file  Social History Narrative   Not on file   Social Determinants of Health   Financial Resource Strain: Not on file  Food Insecurity: Not on file  Transportation Needs: Not on file  Physical Activity: Not on file  Stress: Not on file  Social Connections: Not on file  Intimate Partner Violence: Not on file    Family History:    Family History  Problem Relation Age of Onset   Hypertension Mother    Hypertension Father      ROS:  Please see the history of present illness.  Constitutional: Negative for chills, fever, night sweats, unintentional weight loss  HENT: Negative for ear pain and hearing loss.   Eyes: Negative for loss of vision and eye pain.  Respiratory: Negative for cough,  sputum, wheezing.   Cardiovascular: See HPI. Gastrointestinal: Negative for abdominal pain, melena, and hematochezia.  Genitourinary: Negative for dysuria and hematuria.  Musculoskeletal: Negative for falls and myalgias.  Skin: Negative for itching and rash.  Neurological: Negative for focal weakness, focal sensory changes and loss of consciousness.  Endo/Heme/Allergies: Does not bruise/bleed easily.   All other ROS reviewed and negative.     Physical Exam/Data:   Vitals:   08/13/21 1400 08/13/21 1415 08/13/21 1430 08/13/21 1500  BP: (!) 143/109  (!) 152/123 (!) 148/112  Pulse: 74 83 77 66  Resp: (!) 24 (!) 25 (!) 23 16  Temp:      TempSrc:      SpO2: 97%  98% 98% 95%  Weight:      Height:       No intake or output data in the 24 hours ending 08/13/21 1541    08/13/2021    7:47 AM 09/28/2020    4:36 AM 03/11/2020    8:30 AM  Last 3 Weights  Weight (lbs) 305 lb 312 lb 290 lb  Weight (kg) 138.347 kg 141.522 kg 131.543 kg     Body mass index is 38.12 kg/m.  General:  Well nourished, well developed. Eyes closed, resting comfortably HEENT: normal Neck: JVD just above clavicle Vascular: No carotid bruits; Distal pulses 2+ bilaterally Cardiac:  normal S1, S2; RRR; no murmur  Lungs:  clear to auscultation bilaterally, no wheezing, rhonchi or rales  Abd: soft, nontender, no hepatomegaly  Ext: no significant LE edema Musculoskeletal:  No deformities, BUE and BLE strength normal and equal Skin: warm and dry  Neuro:  CNs 2-12 intact, no focal abnormalities noted Psych:  Normal affect   EKG:  The EKG was personally reviewed and demonstrates: Sinus rhythm HR 96 TWI inferior (new) and lateral leads (old) Telemetry:  Telemetry was personally reviewed and demonstrates:  SR, single PVC  Relevant CV Studies:  CT coronary 12/01/2020 IMPRESSION:  -  Calcium score of 32.  This places the patient in the 56 percentile for age and race based on the [place] data base.  -  No significant CAD  noted but all 3 major coronary arteries are larger in diameter than usual. 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 Mid section of the RCA has a narrow segment at the angle.  - Mildly dilated ascending aorta  -  An addendum commenting on non-cardiac structures to follow.    Laboratory Data:  High Sensitivity Troponin:   Recent Labs  Lab 08/13/21 0800 08/13/21 1048 08/13/21 1346  TROPONINIHS 59* 164* 383*     Chemistry Recent Labs  Lab 08/13/21 0800  NA 140  K 3.7  CL 107  CO2 25  GLUCOSE 127*  BUN 12  CREATININE 1.11  CALCIUM 9.4  GFRNONAA >60  ANIONGAP 8    Recent Labs  Lab 08/13/21 0800  PROT 7.9  ALBUMIN 4.1  AST 22  ALT 23  ALKPHOS 38  BILITOT 0.7   Lipids No results for input(s): CHOL, TRIG, HDL, LABVLDL, LDLCALC, CHOLHDL in the last 168 hours.  Hematology Recent Labs  Lab 08/13/21 0800  WBC 5.1  RBC 4.75  HGB 14.7  HCT 43.1  MCV 90.7  MCH 30.9  MCHC 34.1  RDW 13.8  PLT 240   Thyroid No results for input(s): TSH, FREET4 in the last 168 hours.  BNP Recent Labs  Lab 08/13/21 0800  BNP 131.6*    DDimer No results for input(s): DDIMER in the last 168 hours.   Radiology/Studies:  DG Chest Port 1 View  Result Date: 08/13/2021 CLINICAL DATA:  Dyspnea with exertion, chest pain. EXAM: PORTABLE CHEST 1 VIEW COMPARISON:  June 13, 2020. FINDINGS: Stable cardiomediastinal silhouette. Both lungs are clear. The visualized skeletal structures are unremarkable. IMPRESSION: No active disease. Electronically Signed   By: Lupita Raider M.D.   On: 08/13/2021 08:58     Assessment and Plan:   Chest pain, dyspnea Nonobstructive CAD HS troponin: 59 --> 164 ->383 CT coronary 12/01/2020 with nonobstructive CAD EKG shows LVH with anterior ST elevation likely related to hypertrophy, TWI laterally- TWI now inferior leads - given his prior CT, low suspicion that this is  ACS. Suspect 2/2 hypertensive urgency and acute on chronic heart  failure. However, reasonable to maintain on heparin drip while further evaluation is ongoing.  -echo ordered -will afterload reduce with hydralazine and isordil. Diastolic hypertension is currently out of proportion to systolic hypertension -if chest pain improves with afterload reduction and echo stable, supports that this is increased wall stress. If focal/regional wall motion abnormalities on echo, would pursue further evaluation this admission  Acute on chronic systolic and diastolic heart failure Cardiomyopathy, nonischemic Noncompliance  BNP 132 sCr 1.11, K 3.7 - has received 40 mg IV lasix. There is nearly a full urinal on the sink. Appears euvolemic. - received 20 mg lisinopril at 0833 on 08/13/21. Would hold in order to consider switching this to valsartan with plans to transition to entresto  - pt accepted to the Match program - received 100 mg lopressor - he is warm on exam, will start low dose carvedilol - appears nearly euvolemic. Would work on afterload reduction, can diurese as needed  Hypertension Treated in the context of heart failure  Hyperlipidemia with LDL goal < 70 No recent lipids Will collect lipids and likely start statin  DM A1c 5.9% in 2021  Risk Assessment/Risk Scores:     HEAR Score (for undifferentiated chest pain):  HEAR Score: 3  New York Heart Association (NYHA) Functional Class NYHA Class III   For questions or updates, please contact [facility] Please consult [email or web address] for contact info under   Note prepped with assistance of Micah Flesher, [place]  Signed, Jodelle Red, MD  08/13/2021 3:41 PM

## 2021-08-13 NOTE — ED Triage Notes (Signed)
Pt presents with c/o SHOB esp with exertion, chest pressure since Weds. Dx CHF last year, lost job in Feb so has no had meds since then. Denies CP, leg swelling, visual changes.

## 2021-08-13 NOTE — ED Notes (Signed)
Pt states 'something doesn't feel right'. EDP Plunkett asked to bedside. New EKG taken, orders received for morphine and zofran.

## 2021-08-13 NOTE — Progress Notes (Signed)
Transition of Care [facility]) - Emergency Department Mini Assessment   Patient Details  Name: Joseph Ray MRN: [id number] Date of Birth: April 05, 1975  [facility]) CM/SW Contact:    Oletta Cohn, RN Phone Number: 08/13/2021, 11:44 AM   Clinical Narrative:  RNCM consulted for medication assistance. RNCM reviewed chart and spoke with the pt about [facility] MATCH program ($0 co pay for each Rx through [facility] program, does not include refills, 7 day expiration of MATCH letter and choice of pharmacies). Pt is eligible for [facility] MATCH program (unable to find pt listed in PROCARE per cardholder name inquiry) and has agreed to accept MATCH under terms discussed. PROCARE information entered; Rx sent to [facility] to fill. RNCM updated EDP and ED RN.   RNCM also confirmed that pt does not have PCP. RNCM set-up follow up appointment at [facility] on 6/7 at 0945.  Pt advised to of appointment date, time, address and phone number.   ED Mini Assessment: What brought you to the Emergency Department? : (P) Short of breath and chest tight  Barriers to Discharge: (P) ED Uninsured needing medication assistance, ED Uninsured needing PCP establishment  Barrier interventions: (P) MATCH enrollment with override and PCP appointment     Interventions which prevented an admission or readmission: Medication Review, Follow-up medical appointment    Patient Contact and Communications        ,          Patient states their goals for this hospitalization and ongoing recovery are:: (P) Get my medicine and get back on track      Admission diagnosis:  SOB, Chest tightness Patient Active Problem List   Diagnosis Date Noted   Acute CHF (congestive heart failure) (HCC) 03/11/2020   Essential hypertension 03/11/2020   Ascending aorta dilatation (HCC) 03/11/2020   COVID-19 virus detected 03/11/2020   PCP:  [facility] Pharmacy:    [id number] - [place], [place] - [address] [place] [address] Phone: [phone] Fax: [phone]  [facility] [address] [place] [address] Phone: [phone] Fax: [phone]

## 2021-08-14 ENCOUNTER — Inpatient Hospital Stay (HOSPITAL_COMMUNITY): Payer: Self-pay

## 2021-08-14 DIAGNOSIS — R079 Chest pain, unspecified: Secondary | ICD-10-CM

## 2021-08-14 DIAGNOSIS — I214 Non-ST elevation (NSTEMI) myocardial infarction: Principal | ICD-10-CM

## 2021-08-14 DIAGNOSIS — I119 Hypertensive heart disease without heart failure: Secondary | ICD-10-CM

## 2021-08-14 LAB — CBC
HCT: 41.8 % (ref 39.0–52.0)
HCT: 40.8 % (ref 39.0–52.0)
Hemoglobin: 14.1 g/dL (ref 13.0–17.0)
Hemoglobin: 13.9 g/dL (ref 13.0–17.0)
MCH: 30.9 pg (ref 26.0–34.0)
MCH: 31 pg (ref 26.0–34.0)
MCHC: 33.7 g/dL (ref 30.0–36.0)
MCHC: 34.1 g/dL (ref 30.0–36.0)
MCV: 91.7 fL (ref 80.0–100.0)
MCV: 90.9 fL (ref 80.0–100.0)
Platelets: 271 10*3/uL (ref 150–400)
Platelets: 235 10*3/uL (ref 150–400)
RBC: 4.56 MIL/uL (ref 4.22–5.81)
RBC: 4.49 MIL/uL (ref 4.22–5.81)
RDW: 14 % (ref 11.5–15.5)
RDW: 13.7 % (ref 11.5–15.5)
WBC: 8.4 10*3/uL (ref 4.0–10.5)
WBC: 8.2 10*3/uL (ref 4.0–10.5)
nRBC: 0 % (ref 0.0–0.2)
nRBC: 0 % (ref 0.0–0.2)

## 2021-08-14 LAB — ECHOCARDIOGRAM COMPLETE
Area-P 1/2: 4.37 cm2
Calc EF: 30.6 %
Height: 75 in
S' Lateral: 6 cm
Single Plane A2C EF: 33 %
Single Plane A4C EF: 23.9 %
Weight: 4825.43 oz

## 2021-08-14 LAB — TROPONIN I (HIGH SENSITIVITY): Troponin I (High Sensitivity): 4844 ng/L (ref <18)

## 2021-08-14 LAB — GLUCOSE, CAPILLARY
Glucose-Capillary: 123 mg/dL — ABNORMAL HIGH (ref 70–99)
Glucose-Capillary: 117 mg/dL — ABNORMAL HIGH (ref 70–99)

## 2021-08-14 LAB — HEPARIN LEVEL (UNFRACTIONATED)
Heparin Unfractionated: 0.16 IU/mL — ABNORMAL LOW (ref 0.30–0.70)
Heparin Unfractionated: 0.22 IU/mL — ABNORMAL LOW (ref 0.30–0.70)
Heparin Unfractionated: 0.37 IU/mL (ref 0.30–0.70)
Heparin Unfractionated: 0.34 IU/mL (ref 0.30–0.70)

## 2021-08-14 LAB — HIV ANTIBODY (ROUTINE TESTING W REFLEX): HIV Screen 4th Generation wRfx: NONREACTIVE

## 2021-08-14 MED ORDER — CARVEDILOL 6.25 MG PO TABS: 6.2500 mg | ORAL_TABLET | Freq: Two times a day (BID) | ORAL | Status: DC

## 2021-08-14 MED ORDER — SODIUM CHLORIDE 0.9% FLUSH: 3.0000 mL | INTRAVENOUS | Status: DC | PRN

## 2021-08-14 MED ORDER — HEPARIN BOLUS VIA INFUSION
2000.0000 [IU] | Freq: Once | INTRAVENOUS | Status: AC
  Administered 2021-08-14: 2000 [IU] via INTRAVENOUS
  Filled 2021-08-14: qty 2000

## 2021-08-14 MED ORDER — SODIUM CHLORIDE 0.9 % IV SOLN: INTRAVENOUS | Status: DC

## 2021-08-14 MED ORDER — NITROGLYCERIN 0.4 MG SL SUBL
0.4000 mg | SUBLINGUAL_TABLET | Freq: Once | SUBLINGUAL | Status: AC
Start: 2021-08-14 — End: 2021-08-14

## 2021-08-14 MED ORDER — CARVEDILOL 6.25 MG PO TABS
6.2500 mg | ORAL_TABLET | Freq: Two times a day (BID) | ORAL | Status: DC
  Administered 2021-08-14 – 2021-08-16 (×5): 6.25 mg via ORAL
  Filled 2021-08-14 (×5): qty 1

## 2021-08-14 MED ORDER — SODIUM CHLORIDE 0.9 % IV SOLN: 250.0000 mL | INTRAVENOUS | Status: DC | PRN

## 2021-08-14 MED ORDER — ASPIRIN 81 MG PO CHEW
81.0000 mg | CHEWABLE_TABLET | ORAL | Status: AC
  Administered 2021-08-16: 81 mg via ORAL
  Filled 2021-08-14: qty 1

## 2021-08-14 MED ORDER — NITROGLYCERIN IN D5W 200-5 MCG/ML-% IV SOLN
0.0000 ug/min | INTRAVENOUS | Status: DC
  Administered 2021-08-14: 5 ug/min via INTRAVENOUS
  Filled 2021-08-14: qty 250

## 2021-08-14 MED ORDER — SODIUM CHLORIDE 0.9% FLUSH
3.0000 mL | Freq: Two times a day (BID) | INTRAVENOUS | Status: DC
  Administered 2021-08-14 – 2021-08-16 (×3): 3 mL via INTRAVENOUS

## 2021-08-14 MED ORDER — FUROSEMIDE 40 MG PO TABS
40.0000 mg | ORAL_TABLET | Freq: Every day | ORAL | Status: DC
  Administered 2021-08-14 – 2021-08-16 (×3): 40 mg via ORAL
  Filled 2021-08-14 (×3): qty 1

## 2021-08-14 MED ORDER — ATORVASTATIN CALCIUM 80 MG PO TABS
80.0000 mg | ORAL_TABLET | Freq: Every day | ORAL | Status: DC
  Administered 2021-08-14 – 2021-08-16 (×3): 80 mg via ORAL
  Filled 2021-08-14 (×3): qty 1

## 2021-08-14 NOTE — Progress Notes (Signed)
PROGRESS NOTE    Joseph Ray  CNO:[id number] DOB: 08/09/1975 DOA: 08/13/2021 PCP: [facility]   Brief Narrative:  Joseph Ray is a 46 y.o. 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 Patient has been compliant with his CHF medication including Entresto until February 2023 when patient lost his job and insurance. Now presenting with worsening symptoms of chest pain, edema, shortness of breath and fatigue.  Assessment & Plan:   Principal Problem:   NSTEMI (non-ST elevated myocardial infarction) (HCC) Active Problems:   Malignant HTN with heart disease, w/o CHF, 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   NSTEMI HTN emergency in the setting of noncompliance, POA -Aggressive blood pressure control with carvedilol, lasix, hydralazine, isosrbide -Cardiology following -Aspirin and Statin ongoing   Acute on chronic systolic CHF decompensation -Continue diuresis -Daily weight, I/O, fluid restriction   Elevated glucose -A1C 6.0 -Follow clinically   Obesity -Improve diet/lifestyle as discussed -Outpatient sleep study to rule out OSA  DVT prophylaxis: heparin gtt ongoing Code Status: Full Family Communication: None present  Status is: Inpt  Dispo: The patient is from: Home              Anticipated d/c is to: Home              Anticipated d/c date is: 48-72h              Patient currently NOT medically stable for discharge  Consultants:  Cardiology  Procedures:  None  Antimicrobials:  None   Subjective: No acute issue/events overnight  Objective: Vitals:   08/14/21 0043 08/14/21 0052 08/14/21 0242 08/14/21 0439  BP: 135/84 128/88 125/87   Pulse: 91  91   Resp:      Temp:   98.1 F (36.7 C)   TempSrc:   Oral   SpO2: 99%  96%   Weight:    (!) 136.8 kg  Height:    6\' 3"  (1.905 m)     Intake/Output Summary (Last 24 hours) at 08/14/2021 0738 Last data filed at 08/14/2021 0334 Gross per 24 hour  Intake 357.28 ml  Output --  Net 357.28 ml   Filed Weights   08/13/21 0747 08/14/21 0439  Weight: (!) 138.3 kg (!) 136.8 kg    Examination:  General exam: Appears calm and comfortable  Respiratory system: Clear to auscultation. Respiratory effort normal. Cardiovascular system: S1 & S2 heard, RRR. No JVD, murmurs, rubs, gallops or clicks. No pedal edema. Gastrointestinal system: Abdomen is nondistended, soft and nontender. No organomegaly or masses felt. Normal bowel sounds heard. Central nervous system: Alert and oriented. No focal neurological deficits. Extremities: Symmetric 5 x 5 power. Skin: No rashes, lesions or ulcers Psychiatry: Judgement and insight appear normal. Mood & affect appropriate.   Data Reviewed: I have personally reviewed following labs and imaging studies  CBC: Recent Labs  Lab 08/13/21 0800  WBC 5.1  NEUTROABS 2.7  HGB 14.7  HCT 43.1  MCV 90.7  PLT 240   Basic Metabolic Panel: Recent Labs  Lab 08/13/21 0800  NA 140  K 3.7  CL 107  CO2 25  GLUCOSE 127*  BUN 12  CREATININE 1.11  CALCIUM 9.4   GFR: Estimated Creatinine Clearance: 125.3 mL/min (by C-G formula based on SCr of 1.11 mg/dL). Liver Function Tests: Recent Labs  Lab 08/13/21 0800  AST 22  ALT  23  ALKPHOS 38  BILITOT 0.7  PROT 7.9  ALBUMIN 4.1   No results for input(s): LIPASE, AMYLASE in the last 168 hours. No results for input(s): AMMONIA in the last 168 hours. Coagulation Profile: Recent Labs  Lab 08/13/21 1502  INR 1.0   Cardiac Enzymes: No results for input(s): CKTOTAL, CKMB, CKMBINDEX, TROPONINI in the last 168 hours. BNP (last 3 results) No results for input(s): PROBNP in the last 8760 hours. HbA1C: Recent Labs    08/13/21 2030  HGBA1C 6.0*   CBG: Recent Labs  Lab 08/14/21 0706  GLUCAP 123*   Lipid Profile: No results for input(s):  CHOL, HDL, LDLCALC, TRIG, CHOLHDL, LDLDIRECT in the last 72 hours. Thyroid Function Tests: No results for input(s): TSH, T4TOTAL, FREET4, T3FREE, THYROIDAB in the last 72 hours. Anemia Panel: No results for input(s): VITAMINB12, FOLATE, FERRITIN, TIBC, IRON, RETICCTPCT in the last 72 hours. Sepsis Labs: No results for input(s): PROCALCITON, LATICACIDVEN in the last 168 hours.  No results found for this or any previous visit (from the past 240 hour(s)).   Radiology Studies: DG Chest Port 1 View  Result Date: 08/13/2021 CLINICAL DATA:  Dyspnea with exertion, chest pain. EXAM: PORTABLE CHEST 1 VIEW COMPARISON:  June 13, 2020. FINDINGS: Stable cardiomediastinal silhouette. Both lungs are clear. The visualized skeletal structures are unremarkable. IMPRESSION: No active disease. Electronically Signed   By: Lupita Raider M.D.   On: 08/13/2021 08:58    Scheduled Meds:  aspirin  324 mg Oral NOW   Or   aspirin  300 mg Rectal NOW   aspirin EC  81 mg Oral Daily   carvedilol  3.125 mg Oral BID WC   hydrALAZINE  25 mg Oral TID   isosorbide dinitrate  10 mg Oral TID   Continuous Infusions:  heparin 1,750 Units/hr (08/14/21 0701)   nitroGLYCERIN 5 mcg/min (08/14/21 0530)     LOS: 1 day   Time spent:  Azucena Fallen, DO [facility]  If 7PM-7AM, please contact night-coverage [email or web address]  08/14/2021, 7:38 AM

## 2021-08-14 NOTE — Progress Notes (Signed)
ANTICOAGULATION CONSULT NOTE   Pharmacy Consult for heparin Indication: chest pain/ACS  Patient Measurements: Height: 6\' 3"  (190.5 cm) Weight: (!) 136.8 kg (301 lb 9.4 oz) IBW/kg (Calculated) : 84.5 Heparin Dosing Weight: 115 kg  Vital Signs: Temp: 98.1 F (36.7 C) (06/03 0242) Temp Source: Oral (06/03 0242) BP: 125/87 (06/03 0242) Pulse Rate: 91 (06/03 0242)  Labs: Recent Labs    08/13/21 0800 08/13/21 1048 08/13/21 1346 08/13/21 1502 08/13/21 1541 08/13/21 2030 08/14/21 0125 08/14/21 0409  HGB 14.7  --   --   --   --   --   --   --   HCT 43.1  --   --   --   --   --   --   --   PLT 240  --   --   --   --   --   --   --   APTT  --   --   --  26  --   --   --   --   LABPROT  --   --   --  13.3  --   --   --   --   INR  --   --   --  1.0  --   --   --   --   HEPARINUNFRC  --   --   --   --   --  0.19*  --  0.16*  CREATININE 1.11  --   --   --   --   --   --   --   TROPONINIHS 59*   < > 383*  --  569*  --  4,844*  --    < > = values in this interval not displayed.     Estimated Creatinine Clearance: 125.3 mL/min (by C-G formula based on SCr of 1.11 mg/dL).   Medical History: Past Medical History:  Diagnosis Date   Achilles tendon tear    left   CHF (congestive heart failure) (HCC)    Hypertension    Snores     Medications:  Scheduled:   aspirin  324 mg Oral NOW   Or   aspirin  300 mg Rectal NOW   aspirin EC  81 mg Oral Daily   carvedilol  3.125 mg Oral BID WC   hydrALAZINE  25 mg Oral TID   isosorbide dinitrate  10 mg Oral TID    Assessment: 45 yo male presenting with chest tightness and shortness of breath. Troponins increased from 59 >> 164.  EKG with left ventricular hypertrophy and T wave inversion and mild ST elevation. No AC PTA. Pharmacy is consulted to dose heparin drip for ACS.  Hgb 14.7, platelets 240  6/3 AM update:  Heparin level remains low  Goal of Therapy:  Heparin level 0.3-0.7 units/ml Monitor platelets by anticoagulation  protocol: Yes   Plan:  Heparin 2000 units re-bol[employer] heparin to 1750 units/hr 1400 heparin level  8/3, PharmD, BCPS Clinical Pharmacist Phone: [phone]

## 2021-08-14 NOTE — Progress Notes (Addendum)
Pt experiencing 7 out of 10 chest pain. Pt not in distress. EKG obtained and blood pressure rechecked. Paged cardiology. Cards came to beside and isosorbide administered. Nitro to be reordered. Advised to monitor bp due to nitro and isosorbide being on board.

## 2021-08-14 NOTE — Progress Notes (Signed)
Report called to [place], Charity fundraiser at [employer] at [facility]. [facility] contact for transport. Pt informed.

## 2021-08-14 NOTE — Progress Notes (Signed)
ANTICOAGULATION CONSULT NOTE   Pharmacy Consult for heparin Indication: chest pain/ACS  Patient Measurements: Height: 6\' 3"  (190.5 cm) Weight: (!) 136.8 kg (301 lb 9.4 oz) IBW/kg (Calculated) : 84.5 Heparin Dosing Weight: 115 kg  Vital Signs: Temp: 98.7 F (37.1 C) (06/03 1238) Temp Source: Oral (06/03 1238) BP: 137/93 (06/03 1238) Pulse Rate: 99 (06/03 0907)  Labs: Recent Labs    08/13/21 0800 08/13/21 1048 08/13/21 1346 08/13/21 1502 08/13/21 1541 08/13/21 2030 08/14/21 0125 08/14/21 0409 08/14/21 0655 08/14/21 1433  HGB 14.7  --   --   --   --   --   --   --  13.9  --   HCT 43.1  --   --   --   --   --   --   --  40.8  --   PLT 240  --   --   --   --   --   --   --  235  --   APTT  --   --   --  26  --   --   --   --   --   --   LABPROT  --   --   --  13.3  --   --   --   --   --   --   INR  --   --   --  1.0  --   --   --   --   --   --   HEPARINUNFRC  --   --   --   --   --    < >  --  0.16* 0.22* 0.37  CREATININE 1.11  --   --   --   --   --   --   --   --   --   TROPONINIHS 59*   < > 383*  --  569*  --  4,844*  --   --   --    < > = values in this interval not displayed.     Estimated Creatinine Clearance: 125.3 mL/min (by C-G formula based on SCr of 1.11 mg/dL).   Medical History: Past Medical History:  Diagnosis Date   Achilles tendon tear    left   CHF (congestive heart failure) (HCC)    Hypertension    Snores     Medications:  Scheduled:   aspirin  324 mg Oral NOW   Or   aspirin  300 mg Rectal NOW   [START ON 08/16/2021] aspirin  81 mg Oral Pre-Cath   aspirin EC  81 mg Oral Daily   atorvastatin  80 mg Oral Daily   carvedilol  6.25 mg Oral BID WC   furosemide  40 mg Oral Daily   hydrALAZINE  25 mg Oral TID   isosorbide dinitrate  10 mg Oral TID   sodium chloride flush  3 mL Intravenous Q12H    Assessment: 46 yo male presenting with chest tightness and shortness of breath. Troponins increased from 59 >> 164.  EKG with left ventricular  hypertrophy and T wave inversion and mild ST elevation. No AC PTA. Pharmacy is consulted to dose heparin drip for ACS.  Heparin level this afternoon is 0.37 and therapeutic after rate increase to 1750 units/h. Hgb and platelets remain stable. No signs of bleeding noted.   Goal of Therapy:  Heparin level 0.3-0.7 units/ml Monitor platelets by anticoagulation protocol: Yes   Plan:  Continue IV  heparin gtt at 1750 units/h Confirmatory 8h heparin level Daily heparin level, CBC Monitor for signs and symptoms of bleeding  Thank you for involving pharmacy in this patient's care.  Arnette Felts, PharmD PGY1 Ambulatory Care Pharmacy Resident 08/14/2021 3:19 PM  **Pharmacist phone directory can be found on [email or web address] listed under [facility]**

## 2021-08-14 NOTE — Progress Notes (Signed)
Progress Note  Patient Name: Joseph Ray Date of Encounter: 08/14/2021  [facility] Cardiologist: Jodelle Red, MD   Subjective   Feels much better this AM. He denies SOB . He still feels chest twinges.  Satting high, can remove his [place].  Inpatient Medications    Scheduled Meds:  aspirin  324 mg Oral NOW   Or   aspirin  300 mg Rectal NOW   aspirin EC  81 mg Oral Daily   carvedilol  6.25 mg Oral BID WC   hydrALAZINE  25 mg Oral TID   isosorbide dinitrate  10 mg Oral TID   Continuous Infusions:  heparin 1,750 Units/hr (08/14/21 0701)   PRN Meds: acetaminophen, hydrALAZINE, ondansetron (ZOFRAN) IV   Vital Signs    Vitals:   08/14/21 0043 08/14/21 0052 08/14/21 0242 08/14/21 0439  BP: 135/84 128/88 125/87   Pulse: 91  91   Resp:      Temp:   98.1 F (36.7 C)   TempSrc:   Oral   SpO2: 99%  96%   Weight:    (!) 136.8 kg  Height:    6\' 3"  (1.905 m)    Intake/Output Summary (Last 24 hours) at 08/14/2021 0838 Last data filed at 08/14/2021 0334 Gross per 24 hour  Intake 357.28 ml  Output --  Net 357.28 ml      08/14/2021    4:39 AM 08/13/2021    7:47 AM 09/28/2020    4:36 AM  Last 3 Weights  Weight (lbs) 301 lb 9.4 oz 305 lb 312 lb  Weight (kg) 136.8 kg 138.347 kg 141.522 kg      Telemetry    NSR - Personally Reviewed  ECG    NSR, LVH with repol- Personally Reviewed  Physical Exam   Vitals:   08/14/21 0052 08/14/21 0242  BP: 128/88 125/87  Pulse:  91  Resp:    Temp:  98.1 F (36.7 C)  SpO2:  96%    GEN: No acute distress.   Neck: No JVD Cardiac: RRR, no murmurs, rubs, or gallops.  Respiratory: Clear to auscultation bilaterally. GI: Soft, nontender, non-distended  MS: No edema; No deformity. VASC: 2+ radial pulses Neuro:  Nonfocal  Psych: Normal affect   Labs    High Sensitivity Troponin:   Recent Labs  Lab 08/13/21 0800 08/13/21 1048 08/13/21 1346 08/13/21 1541 08/14/21 0125  TROPONINIHS 59* 164* 383* 569* 4,844*      Chemistry Recent Labs  Lab 08/13/21 0800  NA 140  K 3.7  CL 107  CO2 25  GLUCOSE 127*  BUN 12  CREATININE 1.11  CALCIUM 9.4  PROT 7.9  ALBUMIN 4.1  AST 22  ALT 23  ALKPHOS 38  BILITOT 0.7  GFRNONAA >60  ANIONGAP 8    Lipids No results for input(s): CHOL, TRIG, HDL, LABVLDL, LDLCALC, CHOLHDL in the last 168 hours.  Hematology Recent Labs  Lab 08/13/21 0800  WBC 5.1  RBC 4.75  HGB 14.7  HCT 43.1  MCV 90.7  MCH 30.9  MCHC 34.1  RDW 13.8  PLT 240   Thyroid No results for input(s): TSH, FREET4 in the last 168 hours.  BNP Recent Labs  Lab 08/13/21 0800  BNP 131.6*    DDimer No results for input(s): DDIMER in the last 168 hours.   Radiology    DG Chest Port 1 View  Result Date: 08/13/2021 CLINICAL DATA:  Dyspnea with exertion, chest pain. EXAM: PORTABLE CHEST 1 VIEW COMPARISON:  June 13, 2020. FINDINGS: Stable cardiomediastinal silhouette. Both lungs are clear. The visualized skeletal structures are unremarkable. IMPRESSION: No active disease. Electronically Signed   By: Lupita Raider M.D.   On: 08/13/2021 08:58    Cardiac Studies   CT coronary 12/01/2020 IMPRESSION:  -  Calcium score of 32.  This places the patient in the 60 percentile for age and race based on the [place] data base.  -  No significant CAD noted but all 3 major coronary arteries are larger in diameter than usual. 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 Mid section of the RCA has a narrow segment at the angle.  - Mildly dilated ascending aorta  -  An addendum commenting on non-cardiac structures to follow.  TTE 30-35% 1. Left ventricular ejection fraction, by estimation, is 30 to 35%. The  left ventricle has moderately decreased function. The left ventricle  demonstrates global hypokinesis. The left ventricular internal cavity size  was moderately dilated. There is  moderate left ventricular hypertrophy. Left ventricular diastolic  parameters are consistent with  Grade I diastolic dysfunction (impaired  relaxation).   2. The mitral valve is normal in structure. Mild mitral valve  regurgitation.   3. The aortic valve is normal in structure. Aortic valve regurgitation is  not visualized. No aortic stenosis is present.   4. The inferior vena cava is normal in size with greater than 50%  respiratory variability, suggesting right atrial pressure of 3 mmHg.  Patient Profile     Joseph Ray is a 46 y.o. male with a hx of chronic non ischemic systolic heart failure in the setting of poorly controlled HTN (job changes and could not continue his medications), hypertension, and DM who is being seen 08/13/2021 for the evaluation of chest pain at the request of Dr. Anitra Lauth.  Assessment & Plan   NSTEMI: he has a significant delta trop and p/w chest pain. His EKG is stable. He is not having a STEMI. He is euvolemic. No CHF exacerbation. Will plan for LHC on Monday - NPO at [facility] on Sunday - continue heparin gtt - continue asa 81 mg daily - continue coreg 6.25 mg BID - start atorvastatin 80 mg daily - start lasix 40 mg daily - nitro SL PRN  The patient understands that risks included but are not limited to stroke (1 in 1000), death (1 in 1000), kidney failure [usually temporary] (1 in 500), bleeding (1 in 200), allergic reaction [possibly serious] (1 in 200).     Hypertensive Emergency: He is HDS. BP are more improved.  - BB per above - continue hydralazine and imdur    For questions or updates, please contact [facility] Please consult [email or web address] for contact info under        Signed, Maisie Fus, MD  08/14/2021, 8:38 AM

## 2021-08-14 NOTE — Significant Event (Signed)
Patient continues to have persistent chest pain and repeat cardiac markers show elevation at 4800.  Patient is already on heparin beta-blockers and aspirin.  Discussed with on-call cardiologist Dr. Rosita Fire with this time advised to transfer patient to [facility].  Dr. Toniann Fail.

## 2021-08-14 NOTE — Progress Notes (Signed)
ANTICOAGULATION CONSULT NOTE   Pharmacy Consult for heparin Indication: chest pain/ACS  Patient Measurements: Height: 6\' 3"  (190.5 cm) Weight: (!) 136.8 kg (301 lb 9.4 oz) IBW/kg (Calculated) : 84.5 Heparin Dosing Weight: 115 kg  Vital Signs: Temp: 98.2 F (36.8 C) (06/03 2145) Temp Source: Oral (06/03 2145) BP: 121/90 (06/03 2145) Pulse Rate: 86 (06/03 2145)  Labs: Recent Labs    08/13/21 0800 08/13/21 1048 08/13/21 1346 08/13/21 1502 08/13/21 1541 08/13/21 2030 08/14/21 0125 08/14/21 0409 08/14/21 0655 08/14/21 1433 08/14/21 2210  HGB 14.7  --   --   --   --   --   --   --  13.9  --   --   HCT 43.1  --   --   --   --   --   --   --  40.8  --   --   PLT 240  --   --   --   --   --   --   --  235  --   --   APTT  --   --   --  26  --   --   --   --   --   --   --   LABPROT  --   --   --  13.3  --   --   --   --   --   --   --   INR  --   --   --  1.0  --   --   --   --   --   --   --   HEPARINUNFRC  --   --   --   --   --    < >  --    < > 0.22* 0.37 0.34  CREATININE 1.11  --   --   --   --   --   --   --   --   --   --   TROPONINIHS 59*   < > 383*  --  569*  --  4,844*  --   --   --   --    < > = values in this interval not displayed.     Estimated Creatinine Clearance: 125.3 mL/min (by C-G formula based on SCr of 1.11 mg/dL).   Medical History: Past Medical History:  Diagnosis Date   Achilles tendon tear    left   CHF (congestive heart failure) (HCC)    Hypertension    Snores     Medications:  Scheduled:   [START ON 08/16/2021] aspirin  81 mg Oral Pre-Cath   aspirin EC  81 mg Oral Daily   atorvastatin  80 mg Oral Daily   carvedilol  6.25 mg Oral BID WC   furosemide  40 mg Oral Daily   hydrALAZINE  25 mg Oral TID   isosorbide dinitrate  10 mg Oral TID   sodium chloride flush  3 mL Intravenous Q12H    Assessment: 46 yo male presenting with chest tightness and shortness of breath. Troponins increased from 59 >> 164.  EKG with left ventricular  hypertrophy and T wave inversion and mild ST elevation. No AC PTA. Pharmacy is consulted to dose heparin drip for ACS.  Confirmatory heparin level remains therapeutic this evening.  Goal of Therapy:  Heparin level 0.3-0.7 units/ml Monitor platelets by anticoagulation protocol: Yes   Plan:  Continue IV heparin gtt at 1750 units/h Daily heparin  level and CBC  Arrie Senate, PharmD, BCPS, [facility] Clinical Pharmacist [phone] Please check AMION for all [place] numbers 08/14/2021

## 2021-08-14 NOTE — Progress Notes (Signed)
   Called to see patient for recurrent chest pain. Went to bedside. Patient currently sitting in bed resting comfortably. He states he started having recurrent chest pain/back around 1pm. This is the same type of pain that brought him in. Currently ranks it at a 7/10. Does not look to be in any distress. No adventitious heart or lung sounds on exam. Repeat EKG shows slight ST depression in V4-V5 with T wave inversion in leads V4-V6 and biphasic T waves in leads II and aVF. No significant changes compared to prior EKG. Plan is for cath on Monday. IV Nitro was stopped this morning around 10am. He was not having any pain this morning on the Nitro drip. Will restart this. Of note, patient is also on Isordil which was started yesterday. Will monitor BP closely with this. Advised patient to let us know if pain does not improve on Nitro drip.  Corrin Parker, PA-C 08/14/2021 4:42 PM

## 2021-08-14 NOTE — Progress Notes (Signed)
  Echocardiogram 2D Echocardiogram has been performed.  Joseph Ray 08/14/2021, 11:14 AM

## 2021-08-14 NOTE — Progress Notes (Signed)
Pt reported chest pressure 8/10 pain. Pt given SL Nitro x2 and pain came down to 5/10 pain. Pt states he does not want an additional 3rd dose. VS obtained. MD notified/aware.

## 2021-08-15 LAB — BASIC METABOLIC PANEL
Anion gap: 9 (ref 5–15)
BUN: 12 mg/dL (ref 6–20)
CO2: 24 mmol/L (ref 22–32)
Calcium: 9.1 mg/dL (ref 8.9–10.3)
Chloride: 103 mmol/L (ref 98–111)
Creatinine, Ser: 1.17 mg/dL (ref 0.61–1.24)
GFR, Estimated: >60 mL/min (ref >60)
Glucose, Bld: 116 mg/dL — ABNORMAL HIGH (ref 70–99)
Potassium: 3.5 mmol/L (ref 3.5–5.1)
Sodium: 136 mmol/L (ref 135–145)

## 2021-08-15 LAB — GLUCOSE, CAPILLARY
Glucose-Capillary: 130 mg/dL — ABNORMAL HIGH (ref 70–99)
Glucose-Capillary: 109 mg/dL — ABNORMAL HIGH (ref 70–99)
Glucose-Capillary: 97 mg/dL (ref 70–99)

## 2021-08-15 LAB — CBC
HCT: 40.7 % (ref 39.0–52.0)
Hemoglobin: 14.3 g/dL (ref 13.0–17.0)
MCH: 31.4 pg (ref 26.0–34.0)
MCHC: 35.1 g/dL (ref 30.0–36.0)
MCV: 89.3 fL (ref 80.0–100.0)
Platelets: 245 10*3/uL (ref 150–400)
RBC: 4.56 MIL/uL (ref 4.22–5.81)
RDW: 13.7 % (ref 11.5–15.5)
WBC: 9.8 10*3/uL (ref 4.0–10.5)
nRBC: 0 % (ref 0.0–0.2)

## 2021-08-15 LAB — HEPARIN LEVEL (UNFRACTIONATED)
Heparin Unfractionated: 0.24 IU/mL — ABNORMAL LOW (ref 0.30–0.70)
Heparin Unfractionated: 0.14 IU/mL — ABNORMAL LOW (ref 0.30–0.70)
Heparin Unfractionated: 0.28 IU/mL — ABNORMAL LOW (ref 0.30–0.70)

## 2021-08-15 MED ORDER — HEPARIN BOLUS VIA INFUSION
1800.0000 [IU] | Freq: Once | INTRAVENOUS | Status: AC
  Administered 2021-08-15: 1800 [IU] via INTRAVENOUS
  Filled 2021-08-15: qty 1800

## 2021-08-15 NOTE — Progress Notes (Signed)
PROGRESS NOTE    Joseph Ray  ZOX:[id number] DOB: 1975-12-30 DOA: 08/13/2021 PCP: [facility]   Brief Narrative:  Joseph Ray is a 46 y.o. 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 Patient has been compliant with his CHF medication including Entresto until February 2023 when patient lost his job and insurance. Now presenting with worsening symptoms of chest pain, edema, shortness of breath and fatigue.  Assessment & Plan:   Principal Problem:   NSTEMI (non-ST elevated myocardial infarction) (HCC) Active Problems:   Malignant HTN with heart disease, w/o CHF, 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  NSTEMI HTN emergency in the setting of noncompliance, POA -Aggressive blood pressure control with carvedilol, lasix, hydralazine, isosrbide -Cardiology following -appreciate insight and recommendations -Aspirin and Statin ongoing -Left heart cath planned 08/16/21   Acute on chronic systolic CHF decompensation -Continue diuresis -Daily weight, I/O, fluid restriction   Elevated glucose -A1C 6.0 -Follow clinically   Obesity -Improve diet/lifestyle as discussed -Outpatient sleep study to rule out OSA  DVT prophylaxis: heparin gtt ongoing Code Status: Full Family Communication: None present  Status is: Inpt  Dispo: The patient is from: Home              Anticipated d/c is to: Home              Anticipated d/c date is: 48-72h              Patient currently NOT medically stable for discharge  Consultants:  Cardiology  Procedures:  None  Antimicrobials:  None   Subjective: No acute issue/events overnight, shortness of breath and chest pain resolved  Objective: Vitals:   08/14/21 1238 08/14/21 1618 08/14/21 2145 08/15/21 0423  BP: (!) 137/93 (!) 136/109 121/90 (!) 133/95  Pulse:  90 86 92   Resp: Temp: 98.7 F (37.1 C)  98.2 F (36.8 C) 98.2 F (36.8 C)  TempSrc: Oral  Oral Oral  SpO2:   97% 93%  Weight:    136 kg  Height:        Intake/Output Summary (Last 24 hours) at 08/15/2021 0746 Last data filed at 08/15/2021 0506 Gross per 24 hour  Intake 482.28 ml  Output 725 ml  Net -242.72 ml    Filed Weights   08/13/21 0747 08/14/21 0439 08/15/21 0423  Weight: (!) 138.3 kg (!) 136.8 kg 136 kg    Examination:  General exam: Appears calm and comfortable  Respiratory system: Clear to auscultation. Respiratory effort normal. Cardiovascular system: S1 & S2 heard, RRR. No JVD, murmurs, rubs, gallops or clicks. No pedal edema. Gastrointestinal system: Abdomen is nondistended, soft and nontender. No organomegaly or masses felt. Normal bowel sounds heard. Central nervous system: Alert and oriented. No focal neurological deficits. Extremities: Symmetric 5 x 5 power. Skin: No rashes, lesions or ulcers Psychiatry: Judgement and insight appear normal. Mood & affect appropriate.   Data Reviewed: I have personally reviewed following labs and imaging studies  CBC: Recent Labs  Lab 08/13/21 0800 08/14/21 0655 08/15/21 0257  WBC 5.1 8.2 9.8  NEUTROABS 2.7  --   --   HGB 14.7 13.9 14.3  HCT 43.1 40.8 40.7  MCV 90.7 90.9 89.3  PLT 240 235 245    Basic Metabolic Panel: Recent Labs  Lab 08/13/21 0800 08/15/21 0257  NA 140 136  K  3.7 3.5  CL 107 103  CO2 25 24  GLUCOSE 127* 116*  BUN 12 12  CREATININE 1.11 1.17  CALCIUM 9.4 9.1    GFR: Estimated Creatinine Clearance: 118.5 mL/min (by C-G formula based on SCr of 1.17 mg/dL). Liver Function Tests: Recent Labs  Lab 08/13/21 0800  AST 22  ALT 23  ALKPHOS 38  BILITOT 0.7  PROT 7.9  ALBUMIN 4.1    No results for input(s): LIPASE, AMYLASE in the last 168 hours. No results for input(s): AMMONIA in the last 168 hours. Coagulation Profile: Recent Labs  Lab 08/13/21 1502  INR 1.0    Cardiac  Enzymes: No results for input(s): CKTOTAL, CKMB, CKMBINDEX, TROPONINI in the last 168 hours. BNP (last 3 results) No results for input(s): PROBNP in the last 8760 hours. HbA1C: Recent Labs    08/13/21 2030  HGBA1C 6.0*    CBG: Recent Labs  Lab 08/14/21 0706 08/14/21 2150 08/15/21 0420  GLUCAP 123* 117* 130*    Lipid Profile: No results for input(s): CHOL, HDL, LDLCALC, TRIG, CHOLHDL, LDLDIRECT in the last 72 hours. Thyroid Function Tests: No results for input(s): TSH, T4TOTAL, FREET4, T3FREE, THYROIDAB in the last 72 hours. Anemia Panel: No results for input(s): VITAMINB12, FOLATE, FERRITIN, TIBC, IRON, RETICCTPCT in the last 72 hours. Sepsis Labs: No results for input(s): PROCALCITON, LATICACIDVEN in the last 168 hours.  No results found for this or any previous visit (from the past 240 hour(s)).   Radiology Studies: DG Chest Port 1 View  Result Date: 08/13/2021 CLINICAL DATA:  Dyspnea with exertion, chest pain. EXAM: PORTABLE CHEST 1 VIEW COMPARISON:  June 13, 2020. FINDINGS: Stable cardiomediastinal silhouette. Both lungs are clear. The visualized skeletal structures are unremarkable. IMPRESSION: No active disease. Electronically Signed   By: Lupita Raider M.D.   On: 08/13/2021 08:58   ECHOCARDIOGRAM COMPLETE  Result Date: 08/14/2021    ECHOCARDIOGRAM REPORT   Patient Name:   Joseph Ray Date of Exam: 08/14/2021 Medical Rec #:  [id number]      Height:       75.0 in Accession #:    [id number]     Weight:       301.6 lb Date of Birth:  1975-05-21      BSA:          2.613 m Patient Age:    45 years       BP:           150/108 mmHg Patient Gender: M              HR:           75 bpm. Exam Location:  Inpatient Procedure: 2D Echo, 3D Echo, Cardiac Doppler and Color Doppler Indications:    R07.9* Chest pain, unspecified  History:        Patient has prior history of Echocardiogram examinations, most                 recent 03/12/2020. CHF, Previous Myocardial Infarction,                  Signs/Symptoms:Shortness of Breath and Dyspnea; Risk                 Factors:Hypertension and Dyslipidemia.  Sonographer:    Sheralyn Boatman RDCS Referring Phys: [id number] [facility]  Sonographer Comments: Image acquisition challenging due to patient body habitus. IMPRESSIONS  1. Left ventricular ejection fraction, by estimation, is 30 to 35%. The left ventricle has  moderately decreased function. The left ventricle demonstrates global hypokinesis. There is moderate left ventricular hypertrophy. Left ventricular diastolic parameters are consistent with Grade II diastolic dysfunction (pseudonormalization).  2. Right ventricular systolic function is normal. The right ventricular size is normal. Tricuspid regurgitation signal is inadequate for assessing PA pressure.  3. Functional MR. Mild to moderate mitral valve regurgitation.  4. Aortic valve regurgitation is not visualized.  5. Aortic small ascending aortic aneurysm 4.3 cm.  6. The inferior vena cava is normal in size with greater than 50% respiratory variability, suggesting right atrial pressure of 3 mmHg. Comparison(s): No significant change from prior study. FINDINGS  Left Ventricle: Left ventricular ejection fraction, by estimation, is 30 to 35%. The left ventricle has moderately decreased function. The left ventricle demonstrates global hypokinesis. The left ventricular internal cavity size was normal in size. There is moderate left ventricular hypertrophy. Left ventricular diastolic parameters are consistent with Grade II diastolic dysfunction (pseudonormalization). Right Ventricle: The right ventricular size is normal. Right ventricular systolic function is normal. Tricuspid regurgitation signal is inadequate for assessing PA pressure. Left Atrium: Left atrial size was normal in size. Right Atrium: Right atrial size was normal in size. Pericardium: There is no evidence of pericardial effusion. Mitral Valve: Functional MR. Mild to moderate mitral valve  regurgitation. Tricuspid Valve: Tricuspid valve regurgitation is not demonstrated. Aortic Valve: Aortic valve regurgitation is not visualized. Pulmonic Valve: Pulmonic valve regurgitation is not visualized. Aorta: Small ascending aortic aneurysm 4.3 cm. Venous: The inferior vena cava is normal in size with greater than 50% respiratory variability, suggesting right atrial pressure of 3 mmHg. IAS/Shunts: No atrial level shunt detected by color flow Doppler.  LEFT VENTRICLE PLAX 2D LVIDd:         6.60 cm      Diastology LVIDs:         6.00 cm      LV e' lateral:   6.64 cm/s LV PW:         1.60 cm      LV E/e' lateral: 12.2 LV IVS:        1.50 cm LVOT diam:     2.50 cm LV SV:         65 LV SV Index:   25 LVOT Area:     4.91 cm  LV Volumes (MOD) LV vol d, MOD A2C: 294.0 ml LV vol d, MOD A4C: 289.0 ml LV vol s, MOD A2C: 197.0 ml LV vol s, MOD A4C: 220.0 ml LV SV MOD A2C:     97.0 ml LV SV MOD A4C:     289.0 ml LV SV MOD BP:      92.6 ml RIGHT VENTRICLE             IVC RV S prime:     11.20 cm/s  IVC diam: 2.40 cm TAPSE (M-mode): 1.7 cm LEFT ATRIUM              Index        RIGHT ATRIUM           Index LA diam:        5.50 cm  2.10 cm/m   RA Area:     16.30 cm LA Vol (A2C):   100.0 ml 38.27 ml/m  RA Volume:   37.70 ml  14.43 ml/m LA Vol (A4C):   76.8 ml  29.39 ml/m LA Biplane Vol: 90.1 ml  34.48 ml/m  AORTIC VALVE LVOT Vmax:   78.80 cm/s LVOT Vmean:  51.300 cm/s LVOT VTI:    0.133 m  AORTA Ao Root diam: 4.00 cm Ao Asc diam:  4.20 cm MITRAL VALVE MV Area (PHT): 4.37 cm    SHUNTS MV Decel Time: 174 msec    Systemic VTI:  0.13 m MV E velocity: 81.13 cm/s  Systemic Diam: 2.50 cm MV A velocity: 51.45 cm/s MV E/A ratio:  1.58 Photographer signed by Carolan Clines Signature Date/Time: 08/14/2021/12:33:29 PM    Final     Scheduled Meds:  [START ON 08/16/2021] aspirin  81 mg Oral Pre-Cath   aspirin EC  81 mg Oral Daily   atorvastatin  80 mg Oral Daily   carvedilol  6.25 mg Oral BID WC   furosemide  40 mg Oral  Daily   hydrALAZINE  25 mg Oral TID   isosorbide dinitrate  10 mg Oral TID   sodium chloride flush  3 mL Intravenous Q12H   Continuous Infusions:  sodium chloride     [START ON 08/16/2021] sodium chloride     heparin 1,950 Units/hr (08/15/21 0506)   nitroGLYCERIN 5 mcg/min (08/14/21 1656)     LOS: 2 days   Time spent:  Azucena Fallen, DO [facility]  If 7PM-7AM, please contact night-coverage [email or web address]  08/15/2021, 7:46 AM

## 2021-08-15 NOTE — Progress Notes (Signed)
No chest pain this shift. Sleeping comfortably. Needing 2L oxygen via [place] while sleeping; desat to low/mid 80s on room air sleeping. No drops in blood pressure noted post Nitro drip, Imdur, and hydralizine together.

## 2021-08-15 NOTE — Progress Notes (Addendum)
ANTICOAGULATION CONSULT NOTE  Pharmacy Consult for heparin Indication: chest pain/ACS  Patient Measurements: Height: 6\' 3"  (190.5 cm) Weight: 136 kg (299 lb 13.2 oz) IBW/kg (Calculated) : 84.5 Heparin Dosing Weight: 115 kg  Vital Signs: Temp: 98.2 F (36.8 C) (06/04 0423) Temp Source: Oral (06/04 0423) BP: 134/98 (06/04 0837) Pulse Rate: 88 (06/04 0837)  Labs: Recent Labs    08/13/21 0800 08/13/21 1048 08/13/21 1346 08/13/21 1502 08/13/21 1541 08/13/21 2030 08/14/21 0125 08/14/21 0409 08/14/21 0655 08/14/21 1433 08/14/21 2210 08/15/21 0257 08/15/21 1026  HGB 14.7  --   --   --   --   --   --   --  13.9  --   --  14.3  --   HCT 43.1  --   --   --   --   --   --   --  40.8  --   --  40.7  --   PLT 240  --   --   --   --   --   --   --  235  --   --  245  --   APTT  --   --   --  26  --   --   --   --   --   --   --   --   --   LABPROT  --   --   --  13.3  --   --   --   --   --   --   --   --   --   INR  --   --   --  1.0  --   --   --   --   --   --   --   --   --   HEPARINUNFRC  --   --   --   --   --    < >  --    < > 0.22*   < > 0.34 0.24* 0.14*  CREATININE 1.11  --   --   --   --   --   --   --   --   --   --  1.17  --   TROPONINIHS 59*   < > 383*  --  569*  --  4,844*  --   --   --   --   --   --    < > = values in this interval not displayed.     Estimated Creatinine Clearance: 118.5 mL/min (by C-G formula based on SCr of 1.17 mg/dL).  Assessment: 46 y.o. male admitted with chest pain. No anticoagulation prior to admission. Plan is for [facility] tomorrow.  Pharmacy consulted to dose IV heparin.   Heparin level remains below goal at 0.14 after rate increase to 1950 units/h. Hgb and platelets remain stable and within normal limits. Discussed with RN and no IV site issues identified with IV heparin gtt.   Addendum: RN reports that heparin gtt was paused for an undetermined amount of time in the AM and that heparin gtt was dry when she had checked in the AM. Heparin  gtt pause has not been charted. Will bolus and keep rate that was ordered this AM.  Goal of Therapy:  Heparin level 0.3-0.7 units/ml Monitor platelets by anticoagulation protocol: Yes   Plan:  IV heparin 1800 unit bolus x1 Keep IV heparin gtt at 1950 units/h 6h heparin level Daily heparin  level, CBC while on heparin Monitor for signs and symptoms of bleeding  Thank you for involving pharmacy in this patient's care.  Arnette Felts, PharmD PGY1 Ambulatory Care Pharmacy Resident 08/15/2021 11:21 AM  **Pharmacist phone directory can be found on [email or web address] listed under [facility]**

## 2021-08-15 NOTE — Progress Notes (Signed)
Progress Note  Patient Name: Joseph Ray Date of Encounter: 08/15/2021  [facility] Cardiologist: Jodelle Red, MD   Subjective   He had chest pressure yesterday necessitating restart of his nitro gtt. Today he reports improvement on the gtt  Inpatient Medications    Scheduled Meds:  [START ON 08/16/2021] aspirin  81 mg Oral Pre-Cath   aspirin EC  81 mg Oral Daily   atorvastatin  80 mg Oral Daily   carvedilol  6.25 mg Oral BID WC   furosemide  40 mg Oral Daily   hydrALAZINE  25 mg Oral TID   isosorbide dinitrate  10 mg Oral TID   sodium chloride flush  3 mL Intravenous Q12H   Continuous Infusions:  sodium chloride     [START ON 08/16/2021] sodium chloride     heparin 1,950 Units/hr (08/15/21 0956)   nitroGLYCERIN 5 mcg/min (08/14/21 1656)   PRN Meds: sodium chloride, acetaminophen, hydrALAZINE, ondansetron (ZOFRAN) IV, sodium chloride flush   Vital Signs    Vitals:   08/14/21 1618 08/14/21 2145 08/15/21 0423 08/15/21 0837  BP: (!) 136/109 121/90 (!) 133/95 (!) 134/98  Pulse: 90 86 92 88  Resp:  18 18   Temp:  98.2 F (36.8 C) 98.2 F (36.8 C)   TempSrc:  Oral Oral   SpO2:  97% 93% 96%  Weight:   136 kg   Height:        Intake/Output Summary (Last 24 hours) at 08/15/2021 1018 Last data filed at 08/15/2021 0506 Gross per 24 hour  Intake 482.28 ml  Output 725 ml  Net -242.72 ml      08/15/2021    4:23 AM 08/14/2021    4:39 AM 08/13/2021    7:47 AM  Last 3 Weights  Weight (lbs) 299 lb 13.2 oz 301 lb 9.4 oz 305 lb  Weight (kg) 136 kg 136.8 kg 138.347 kg      Telemetry    NSR - Personally Reviewed  ECG    NSR, LAD, LVH with repol- Personally Reviewed  Physical Exam   Vitals:   08/15/21 0423 08/15/21 0837  BP: (!) 133/95 (!) 134/98  Pulse: 92 88  Resp: 18   Temp: 98.2 F (36.8 C)   SpO2: 93% 96%    GEN: No acute distress.   Neck: No JVD Cardiac: RRR, no murmurs, rubs, or gallops.  Respiratory: Clear to auscultation bilaterally. GI:  Soft, nontender, non-distended  MS: No edema; No deformity. VASC: 2+ radial pulses Neuro:  Nonfocal  Psych: Normal affect   Labs    High Sensitivity Troponin:   Recent Labs  Lab 08/13/21 0800 08/13/21 1048 08/13/21 1346 08/13/21 1541 08/14/21 0125  TROPONINIHS 59* 164* 383* 569* 4,844*     Chemistry Recent Labs  Lab 08/13/21 0800 08/15/21 0257  NA 140 136  K 3.7 3.5  CL 107 103  CO2 25 24  GLUCOSE 127* 116*  BUN 12 12  CREATININE 1.11 1.17  CALCIUM 9.4 9.1  PROT 7.9  --   ALBUMIN 4.1  --   AST 22  --   ALT 23  --   ALKPHOS 38  --   BILITOT 0.7  --   GFRNONAA >60 >60  ANIONGAP 8 9    Lipids No results for input(s): CHOL, TRIG, HDL, LABVLDL, LDLCALC, CHOLHDL in the last 168 hours.  Hematology Recent Labs  Lab 08/13/21 0800 08/14/21 0655 08/15/21 0257  WBC 5.1 8.2 9.8  RBC 4.75 4.49 4.56  HGB 14.7  13.9 14.3  HCT 43.1 40.8 40.7  MCV 90.7 90.9 89.3  MCH 30.9 31.0 31.4  MCHC 34.1 34.1 35.1  RDW 13.8 13.7 13.7  PLT 240 235 245   Thyroid No results for input(s): TSH, FREET4 in the last 168 hours.  BNP Recent Labs  Lab 08/13/21 0800  BNP 131.6*    DDimer No results for input(s): DDIMER in the last 168 hours.   Radiology    ECHOCARDIOGRAM COMPLETE  Result Date: 08/14/2021    ECHOCARDIOGRAM REPORT   Patient Name:   Joseph Ray Date of Exam: 08/14/2021 Medical Rec #:  [id number]      Height:       75.0 in Accession #:    [id number]     Weight:       301.6 lb Date of Birth:  [birth year]      BSA:          2.613 m Patient Age:    45 years       BP:           150/108 mmHg Patient Gender: M              HR:           75 bpm. Exam Location:  Inpatient Procedure: 2D Echo, 3D Echo, Cardiac Doppler and Color Doppler Indications:    R07.9* Chest pain, unspecified  History:        Patient has prior history of Echocardiogram examinations, most                 recent 03/12/2020. CHF, Previous Myocardial Infarction,                 Signs/Symptoms:Shortness of Breath and  Dyspnea; Risk                 Factors:Hypertension and Dyslipidemia.  Sonographer:    Sheralyn Boatman RDCS Referring Phys: [id number] [facility]  Sonographer Comments: Image acquisition challenging due to patient body habitus. IMPRESSIONS  1. Left ventricular ejection fraction, by estimation, is 30 to 35%. The left ventricle has moderately decreased function. The left ventricle demonstrates global hypokinesis. There is moderate left ventricular hypertrophy. Left ventricular diastolic parameters are consistent with Grade II diastolic dysfunction (pseudonormalization).  2. Right ventricular systolic function is normal. The right ventricular size is normal. Tricuspid regurgitation signal is inadequate for assessing PA pressure.  3. Functional MR. Mild to moderate mitral valve regurgitation.  4. Aortic valve regurgitation is not visualized.  5. Aortic small ascending aortic aneurysm 4.3 cm.  6. The inferior vena cava is normal in size with greater than 50% respiratory variability, suggesting right atrial pressure of 3 mmHg. Comparison(s): No significant change from prior study. FINDINGS  Left Ventricle: Left ventricular ejection fraction, by estimation, is 30 to 35%. The left ventricle has moderately decreased function. The left ventricle demonstrates global hypokinesis. The left ventricular internal cavity size was normal in size. There is moderate left ventricular hypertrophy. Left ventricular diastolic parameters are consistent with Grade II diastolic dysfunction (pseudonormalization). Right Ventricle: The right ventricular size is normal. Right ventricular systolic function is normal. Tricuspid regurgitation signal is inadequate for assessing PA pressure. Left Atrium: Left atrial size was normal in size. Right Atrium: Right atrial size was normal in size. Pericardium: There is no evidence of pericardial effusion. Mitral Valve: Functional MR. Mild to moderate mitral valve regurgitation. Tricuspid Valve: Tricuspid  valve regurgitation is not demonstrated. Aortic Valve: Aortic valve regurgitation is not visualized. Pulmonic Valve:  Pulmonic valve regurgitation is not visualized. Aorta: Small ascending aortic aneurysm 4.3 cm. Venous: The inferior vena cava is normal in size with greater than 50% respiratory variability, suggesting right atrial pressure of 3 mmHg. IAS/Shunts: No atrial level shunt detected by color flow Doppler.  LEFT VENTRICLE PLAX 2D LVIDd:         6.60 cm      Diastology LVIDs:         6.00 cm      LV e' lateral:   6.64 cm/s LV PW:         1.60 cm      LV E/e' lateral: 12.2 LV IVS:        1.50 cm LVOT diam:     2.50 cm LV SV:         65 LV SV Index:   25 LVOT Area:     4.91 cm  LV Volumes (MOD) LV vol d, MOD A2C: 294.0 ml LV vol d, MOD A4C: 289.0 ml LV vol s, MOD A2C: 197.0 ml LV vol s, MOD A4C: 220.0 ml LV SV MOD A2C:     97.0 ml LV SV MOD A4C:     289.0 ml LV SV MOD BP:      92.6 ml RIGHT VENTRICLE             IVC RV S prime:     11.20 cm/s  IVC diam: 2.40 cm TAPSE (M-mode): 1.7 cm LEFT ATRIUM              Index        RIGHT ATRIUM           Index LA diam:        5.50 cm  2.10 cm/m   RA Area:     16.30 cm LA Vol (A2C):   100.0 ml 38.27 ml/m  RA Volume:   37.70 ml  14.43 ml/m LA Vol (A4C):   76.8 ml  29.39 ml/m LA Biplane Vol: 90.1 ml  34.48 ml/m  AORTIC VALVE LVOT Vmax:   78.80 cm/s LVOT Vmean:  51.300 cm/s LVOT VTI:    0.133 m  AORTA Ao Root diam: 4.00 cm Ao Asc diam:  4.20 cm MITRAL VALVE MV Area (PHT): 4.37 cm    SHUNTS MV Decel Time: 174 msec    Systemic VTI:  0.13 m MV E velocity: 81.13 cm/s  Systemic Diam: 2.50 cm MV A velocity: 51.45 cm/s MV E/A ratio:  1.58 Halford Decamp signed by Carolan Clines Signature Date/Time: 08/14/2021/12:33:29 PM    Final     Cardiac Studies   CT coronary 12/01/2020 IMPRESSION:  -  Calcium score of 32.  This places the patient in the 55 percentile for age and race based on the [place] data base.  -  No significant CAD noted but all 3 major coronary arteries  are larger in diameter than usual. 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 Mid section of the RCA has a narrow segment at the angle.  - Mildly dilated ascending aorta  -  An addendum commenting on non-cardiac structures to follow.  TTE 30-35% 1. Left ventricular ejection fraction, by estimation, is 30 to 35%. The  left ventricle has moderately decreased function. The left ventricle  demonstrates global hypokinesis. The left ventricular internal cavity size  was moderately dilated. There is  moderate left ventricular hypertrophy. Left ventricular diastolic  parameters are consistent with Grade I diastolic dysfunction (impaired  relaxation).  2. The mitral valve is normal in structure. Mild mitral valve  regurgitation.   3. The aortic valve is normal in structure. Aortic valve regurgitation is  not visualized. No aortic stenosis is present.   4. The inferior vena cava is normal in size with greater than 50%  respiratory variability, suggesting right atrial pressure of 3 mmHg.  Patient Profile     Joseph Ray is a 46 y.o. male with a hx of chronic non ischemic systolic heart failure in the setting of poorly controlled HTN (job changes and could not continue his medications), hypertension, and DM who is being seen 08/13/2021 for the evaluation of chest pain at the request of Dr. Anitra Lauth.  Assessment & Plan   NSTEMI: he has a significant delta trop and p/w chest pain. His EKG is stable. He is not having a STEMI. He is euvolemic. No CHF exacerbation. Will plan for [facility] on Monday - NPO at [facility] on Sunday - continue heparin gtt - continue nitro gtt - continue asa 81 mg daily - continue coreg 6.25 mg BID - start atorvastatin 80 mg daily - start lasix 40 mg daily - pending LP(a) - nitro SL PRN  The patient understands that risks included but are not limited to stroke (1 in 1000), death (1 in 1000), kidney failure [usually temporary] (1 in 500), bleeding (1 in 200),  allergic reaction [possibly serious] (1 in 200).     Hypertensive Emergency: He is HDS. BP are more improved; has hx of challenging to control Bps with loss of insurance to pay for is medications.  In the setting of NSTEMI.  - BB per above - continue hydralazine and imdur   For questions or updates, please contact [facility] Please consult [email or web address] for contact info under        Signed, Maisie Fus, MD  08/15/2021, 10:18 AM

## 2021-08-15 NOTE — Progress Notes (Signed)
ANTICOAGULATION CONSULT NOTE  Pharmacy Consult for heparin Indication: chest pain/ACS Brief A/P: Heparin level subtherapeutic Increase Heparin   Patient Measurements: Height: 6\' 3"  (190.5 cm) Weight: 136 kg (299 lb 13.2 oz) IBW/kg (Calculated) : 84.5 Heparin Dosing Weight: 115 kg  Vital Signs: Temp: 98.2 F (36.8 C) (06/04 0423) Temp Source: Oral (06/04 0423) BP: 133/95 (06/04 0423) Pulse Rate: 92 (06/04 0423)  Labs: Recent Labs    08/13/21 0800 08/13/21 1048 08/13/21 1346 08/13/21 1502 08/13/21 1541 08/13/21 2030 08/14/21 0125 08/14/21 0409 08/14/21 0655 08/14/21 1433 08/14/21 2210 08/15/21 0257  HGB 14.7  --   --   --   --   --   --   --  13.9  --   --  14.3  HCT 43.1  --   --   --   --   --   --   --  40.8  --   --  40.7  PLT 240  --   --   --   --   --   --   --  235  --   --  245  APTT  --   --   --  26  --   --   --   --   --   --   --   --   LABPROT  --   --   --  13.3  --   --   --   --   --   --   --   --   INR  --   --   --  1.0  --   --   --   --   --   --   --   --   HEPARINUNFRC  --   --   --   --   --    < >  --    < > 0.22* 0.37 0.34 0.24*  CREATININE 1.11  --   --   --   --   --   --   --   --   --   --  1.17  TROPONINIHS 59*   < > 383*  --  569*  --  4,844*  --   --   --   --   --    < > = values in this interval not displayed.     Estimated Creatinine Clearance: 118.5 mL/min (by C-G formula based on SCr of 1.17 mg/dL).  Assessment: 46 y.o. male with chest pain for heparin   Goal of Therapy:  Heparin level 0.3-0.7 units/ml Monitor platelets by anticoagulation protocol: Yes   Plan:  Increase Heparin 1950 units/hr  54, PharmD, BCPS

## 2021-08-15 NOTE — Progress Notes (Signed)
ANTICOAGULATION CONSULT NOTE  Pharmacy Consult for heparin Indication: chest pain/ACS  Patient Measurements: Height: 6\' 3"  (190.5 cm) Weight: 136 kg (299 lb 13.2 oz) IBW/kg (Calculated) : 84.5 Heparin Dosing Weight: 115 kg  Vital Signs: Temp Source: Oral (06/04 1708) BP: 127/89 (06/04 1710) Pulse Rate: 87 (06/04 1710)  Labs: Recent Labs    08/13/21 0800 08/13/21 1048 08/13/21 1346 08/13/21 1502 08/13/21 1541 08/13/21 2030 08/14/21 0125 08/14/21 0409 08/14/21 0655 08/14/21 1433 08/15/21 0257 08/15/21 1026 08/15/21 1819  HGB 14.7  --   --   --   --   --   --   --  13.9  --  14.3  --   --   HCT 43.1  --   --   --   --   --   --   --  40.8  --  40.7  --   --   PLT 240  --   --   --   --   --   --   --  235  --  245  --   --   APTT  --   --   --  26  --   --   --   --   --   --   --   --   --   LABPROT  --   --   --  13.3  --   --   --   --   --   --   --   --   --   INR  --   --   --  1.0  --   --   --   --   --   --   --   --   --   HEPARINUNFRC  --   --   --   --   --    < >  --    < > 0.22*   < > 0.24* 0.14* 0.28*  CREATININE 1.11  --   --   --   --   --   --   --   --   --  1.17  --   --   TROPONINIHS 59*   < > 383*  --  569*  --  4,844*  --   --   --   --   --   --    < > = values in this interval not displayed.     Estimated Creatinine Clearance: 118.5 mL/min (by C-G formula based on SCr of 1.17 mg/dL).  Assessment: 46 y.o. male admitted with chest pain. No anticoagulation prior to admission. Plan is for [facility] tomorrow.  Pharmacy consulted to dose IV heparin.   Heparin level low this am but reportedly the infusion bag had run empty. Heparin bolus administered and same rate continued. Repeat heparin level just slightly subtherapeutic at 0.28. Will increase infusion slightly.  Goal of Therapy:  Heparin level 0.3-0.7 units/ml Monitor platelets by anticoagulation protocol: Yes   Plan:  Increase heparin to 2100 units/h Repeat heparin level with am labs  [facility], PharmD, BCPS, [facility] Clinical Pharmacist [phone] Please check AMION for all [facility] numbers 08/15/2021

## 2021-08-16 ENCOUNTER — Other Ambulatory Visit (HOSPITAL_COMMUNITY): Payer: Self-pay

## 2021-08-16 ENCOUNTER — Encounter (HOSPITAL_COMMUNITY)
Admission: EM | Disposition: A | Discharge status: Home / Self Care | Payer: Self-pay | Source: Home / Self Care | Attending: Internal Medicine

## 2021-08-16 ENCOUNTER — Encounter (HOSPITAL_COMMUNITY): Payer: Self-pay | Admitting: Interventional Cardiology

## 2021-08-16 DIAGNOSIS — I2510 Atherosclerotic heart disease of native coronary artery without angina pectoris: Secondary | ICD-10-CM

## 2021-08-16 HISTORY — PX: LEFT HEART CATH AND CORONARY ANGIOGRAPHY: CATH118249

## 2021-08-16 LAB — BASIC METABOLIC PANEL
Anion gap: 9 (ref 5–15)
BUN: 14 mg/dL (ref 6–20)
CO2: 26 mmol/L (ref 22–32)
Calcium: 8.8 mg/dL — ABNORMAL LOW (ref 8.9–10.3)
Chloride: 102 mmol/L (ref 98–111)
Creatinine, Ser: 1.34 mg/dL — ABNORMAL HIGH (ref 0.61–1.24)
GFR, Estimated: >60 mL/min (ref >60)
Glucose, Bld: 117 mg/dL — ABNORMAL HIGH (ref 70–99)
Potassium: 4 mmol/L (ref 3.5–5.1)
Sodium: 137 mmol/L (ref 135–145)

## 2021-08-16 LAB — LIPOPROTEIN A (LPA)
Lipoprotein (a): 145 nmol/L — ABNORMAL HIGH (ref <75.0)
Lipoprotein (a): 133.7 nmol/L — ABNORMAL HIGH (ref <75.0)

## 2021-08-16 LAB — CBC
HCT: 41.4 % (ref 39.0–52.0)
Hemoglobin: 14.1 g/dL (ref 13.0–17.0)
MCH: 30.8 pg (ref 26.0–34.0)
MCHC: 34.1 g/dL (ref 30.0–36.0)
MCV: 90.4 fL (ref 80.0–100.0)
Platelets: 226 10*3/uL (ref 150–400)
RBC: 4.58 MIL/uL (ref 4.22–5.81)
RDW: 13.6 % (ref 11.5–15.5)
WBC: 10.6 10*3/uL — ABNORMAL HIGH (ref 4.0–10.5)
nRBC: 0 % (ref 0.0–0.2)

## 2021-08-16 LAB — GLUCOSE, CAPILLARY
Glucose-Capillary: 113 mg/dL — ABNORMAL HIGH (ref 70–99)
Glucose-Capillary: 119 mg/dL — ABNORMAL HIGH (ref 70–99)
Glucose-Capillary: 131 mg/dL — ABNORMAL HIGH (ref 70–99)

## 2021-08-16 LAB — HEPARIN LEVEL (UNFRACTIONATED): Heparin Unfractionated: 0.29 IU/mL — ABNORMAL LOW (ref 0.30–0.70)

## 2021-08-16 SURGERY — LEFT HEART CATH AND CORONARY ANGIOGRAPHY
Anesthesia: LOCAL

## 2021-08-16 MED ORDER — ASPIRIN 81 MG PO CHEW: 81.0000 mg | CHEWABLE_TABLET | Freq: Every day | ORAL | Status: DC

## 2021-08-16 MED ORDER — LIDOCAINE HCL (PF) 1 % IJ SOLN
INTRAMUSCULAR | Status: AC
  Filled 2021-08-16: qty 30

## 2021-08-16 MED ORDER — ISOSORBIDE DINITRATE 10 MG PO TABS
10.0000 mg | ORAL_TABLET | Freq: Three times a day (TID) | ORAL | 2 refills | Status: DC
  Filled 2021-08-16 – 2021-09-28 (×3): qty 90, 30d supply, fill #0
  Filled 2021-12-04: qty 65, 22d supply, fill #1
  Filled 2021-12-07: qty 25, 8d supply, fill #1
  Filled 2021-12-27: qty 90, 30d supply, fill #1

## 2021-08-16 MED ORDER — FUROSEMIDE 40 MG PO TABS
40.0000 mg | ORAL_TABLET | Freq: Every day | ORAL | 0 refills | Status: DC
  Filled 2021-08-16: qty 30, 30d supply, fill #0

## 2021-08-16 MED ORDER — ATORVASTATIN CALCIUM 80 MG PO TABS
80.0000 mg | ORAL_TABLET | Freq: Every day | ORAL | 2 refills | Status: DC
  Filled 2021-08-16 – 2021-09-28 (×3): qty 30, 30d supply, fill #0
  Filled 2021-12-04 – 2021-12-27 (×2): qty 30, 30d supply, fill #1

## 2021-08-16 MED ORDER — LABETALOL HCL 5 MG/ML IV SOLN: 10.0000 mg | INTRAVENOUS | Status: DC | PRN

## 2021-08-16 MED ORDER — CLOPIDOGREL BISULFATE 75 MG PO TABS
75.0000 mg | ORAL_TABLET | Freq: Every day | ORAL | 2 refills | Status: DC
  Filled 2021-08-16 – 2021-09-28 (×3): qty 30, 30d supply, fill #0
  Filled 2021-12-04 – 2021-12-27 (×2): qty 30, 30d supply, fill #1

## 2021-08-16 MED ORDER — HYDRALAZINE HCL 25 MG PO TABS
25.0000 mg | ORAL_TABLET | Freq: Three times a day (TID) | ORAL | 2 refills | Status: DC
  Filled 2021-08-16 – 2021-09-28 (×3): qty 90, 30d supply, fill #0
  Filled 2021-12-04 – 2021-12-27 (×2): qty 90, 30d supply, fill #1

## 2021-08-16 MED ORDER — CLOPIDOGREL BISULFATE 75 MG PO TABS: 75.0000 mg | ORAL_TABLET | Freq: Every day | ORAL | Status: DC

## 2021-08-16 MED ORDER — ASPIRIN 81 MG PO TBEC
81.0000 mg | DELAYED_RELEASE_TABLET | Freq: Every day | ORAL | 12 refills | Status: AC
  Filled 2021-08-16 – 2022-01-29 (×5): qty 30, 30d supply, fill #0
  Filled 2022-02-12: qty 100, 100d supply, fill #0
  Filled 2022-03-04 – 2022-04-04 (×4): qty 30, 30d supply, fill #0

## 2021-08-16 MED ORDER — CLOPIDOGREL BISULFATE 300 MG PO TABS
ORAL_TABLET | ORAL | Status: AC
  Filled 2021-08-16: qty 1

## 2021-08-16 MED ORDER — SODIUM CHLORIDE 0.9% FLUSH: 3.0000 mL | Freq: Two times a day (BID) | INTRAVENOUS | Status: DC

## 2021-08-16 MED ORDER — MIDAZOLAM HCL 2 MG/2ML IJ SOLN
INTRAMUSCULAR | Status: DC | PRN
  Administered 2021-08-16: 2 mg via INTRAVENOUS

## 2021-08-16 MED ORDER — IOHEXOL 350 MG/ML SOLN
INTRAVENOUS | Status: DC | PRN
  Administered 2021-08-16: 70 mL

## 2021-08-16 MED ORDER — SODIUM CHLORIDE 0.9 % IV SOLN: 250.0000 mL | INTRAVENOUS | Status: DC | PRN

## 2021-08-16 MED ORDER — MIDAZOLAM HCL 2 MG/2ML IJ SOLN
INTRAMUSCULAR | Status: AC
  Filled 2021-08-16: qty 2

## 2021-08-16 MED ORDER — HEPARIN (PORCINE) IN NACL 1000-0.9 UT/500ML-% IV SOLN
INTRAVENOUS | Status: DC | PRN
  Administered 2021-08-16 (×3): 500 mL

## 2021-08-16 MED ORDER — HEPARIN SODIUM (PORCINE) 1000 UNIT/ML IJ SOLN
INTRAMUSCULAR | Status: DC | PRN
  Administered 2021-08-16: 6000 [IU] via INTRAVENOUS

## 2021-08-16 MED ORDER — SODIUM CHLORIDE 0.9% FLUSH: 3.0000 mL | INTRAVENOUS | Status: DC | PRN

## 2021-08-16 MED ORDER — FENTANYL CITRATE (PF) 100 MCG/2ML IJ SOLN
INTRAMUSCULAR | Status: DC | PRN
  Administered 2021-08-16: 25 ug via INTRAVENOUS

## 2021-08-16 MED ORDER — CLOPIDOGREL BISULFATE 300 MG PO TABS
ORAL_TABLET | ORAL | Status: DC | PRN
  Administered 2021-08-16: 300 mg via ORAL

## 2021-08-16 MED ORDER — FENTANYL CITRATE (PF) 100 MCG/2ML IJ SOLN
INTRAMUSCULAR | Status: AC
  Filled 2021-08-16: qty 2

## 2021-08-16 MED ORDER — VERAPAMIL HCL 2.5 MG/ML IV SOLN
INTRAVENOUS | Status: DC | PRN
  Administered 2021-08-16: 10 mL via INTRA_ARTERIAL

## 2021-08-16 MED ORDER — CARVEDILOL 6.25 MG PO TABS
6.2500 mg | ORAL_TABLET | Freq: Two times a day (BID) | ORAL | 2 refills | Status: DC
  Filled 2021-08-16 – 2021-09-28 (×3): qty 60, 30d supply, fill #0
  Filled 2021-12-04 – 2021-12-27 (×2): qty 60, 30d supply, fill #1

## 2021-08-16 MED ORDER — LIDOCAINE HCL (PF) 1 % IJ SOLN
INTRAMUSCULAR | Status: DC | PRN
  Administered 2021-08-16: 2 mL

## 2021-08-16 MED ORDER — HEPARIN (PORCINE) IN NACL 1000-0.9 UT/500ML-% IV SOLN
INTRAVENOUS | Status: AC
  Filled 2021-08-16: qty 1000

## 2021-08-16 SURGICAL SUPPLY — 12 items
BAND CMPR LRG ZPHR (HEMOSTASIS) ×1
BAND ZEPHYR COMPRESS 30 LONG (HEMOSTASIS) ×1 IMPLANT
CATH 5FR JL3.5 JR4 ANG PIG MP (CATHETERS) ×1 IMPLANT
CATH INFINITI 5FR AL1 (CATHETERS) ×1 IMPLANT
GLIDESHEATH SLEND SS 6F .021 (SHEATH) ×1 IMPLANT
GUIDEWIRE INQWIRE 1.5J.035X260 (WIRE) IMPLANT
INQWIRE 1.5J .035X260CM (WIRE) ×2
KIT HEART LEFT (KITS) ×2 IMPLANT
PACK CARDIAC CATHETERIZATION (CUSTOM PROCEDURE TRAY) ×2 IMPLANT
SHEATH PROBE COVER 6X72 (BAG) ×1 IMPLANT
TRANSDUCER W/STOPCOCK (MISCELLANEOUS) ×2 IMPLANT
TUBING CIL FLEX 10 FLL-RA (TUBING) ×2 IMPLANT

## 2021-08-16 NOTE — Progress Notes (Signed)
Progress Note  Patient Name: DEVONDRE ACKERMAN Date of Encounter: 08/16/2021  [id number] [facility] Cardiologist: Jodelle Red, MD   Subjective   He had chest pressure 6/3 necessitating restart of his nitro gtt. He continues to have some improvement. Pending LHC  Inpatient Medications    Scheduled Meds:  aspirin EC  81 mg Oral Daily   atorvastatin  80 mg Oral Daily   carvedilol  6.25 mg Oral BID WC   furosemide  40 mg Oral Daily   hydrALAZINE  25 mg Oral TID   isosorbide dinitrate  10 mg Oral TID   sodium chloride flush  3 mL Intravenous Q12H   Continuous Infusions:  sodium chloride     sodium chloride 10 mL/hr at 08/16/21 0616   heparin 2,100 Units/hr (08/15/21 2041)   nitroGLYCERIN Stopped (08/16/21 0714)   PRN Meds: sodium chloride, acetaminophen, hydrALAZINE, ondansetron (ZOFRAN) IV, sodium chloride flush   Vital Signs    Vitals:   08/15/21 1708 08/15/21 1710 08/15/21 2210 08/16/21 0605  BP: 127/89 127/89 133/83 (!) 135/98  Pulse: 91 87  82  Resp: 18   17  Temp:    98.9 F (37.2 C)  TempSrc: Oral   Oral  SpO2:  98%  96%  Weight:      Height:        Intake/Output Summary (Last 24 hours) at 08/16/2021 0822 Last data filed at 08/16/2021 0443 Gross per 24 hour  Intake 494.74 ml  Output 1250 ml  Net -755.26 ml      08/15/2021    4:23 AM 08/14/2021    4:39 AM 08/13/2021    7:47 AM  Last 3 Weights  Weight (lbs) 299 lb 13.2 oz 301 lb 9.4 oz 305 lb  Weight (kg) 136 kg 136.8 kg 138.347 kg      Telemetry    NSR - Personally Reviewed  ECG    NA- Personally Reviewed  Physical Exam   Vitals:   08/15/21 2210 08/16/21 0605  BP: 133/83 (!) 135/98  Pulse:  82  Resp:  17  Temp:  98.9 F (37.2 C)  SpO2:  96%    GEN: No acute distress.   Neck: No JVD Cardiac: RRR, no murmurs, rubs, or gallops.  Respiratory: Clear to auscultation bilaterally. GI: Soft, nontender, non-distended  MS: No edema; No deformity. VASC: 2+ radial pulses Neuro:  Nonfocal  Psych:  Normal affect   Labs    High Sensitivity Troponin:   Recent Labs  Lab 08/13/21 0800 08/13/21 1048 08/13/21 1346 08/13/21 1541 08/14/21 0125  TROPONINIHS 59* 164* 383* 569* 4,844*     Chemistry Recent Labs  Lab 08/13/21 0800 08/15/21 0257 08/16/21 0142  NA 140 136 137  K 3.7 3.5 4.0  CL 107 103 102  CO2 25 24 26   GLUCOSE 127* 116* 117*  BUN 12 12 14   CREATININE 1.11 1.17 1.34*  CALCIUM 9.4 9.1 8.8*  PROT 7.9  --   --   ALBUMIN 4.1  --   --   AST 22  --   --   ALT 23  --   --   ALKPHOS 38  --   --   BILITOT 0.7  --   --   GFRNONAA >60 >60 >60  ANIONGAP 8 9 9     Lipids No results for input(s): CHOL, TRIG, HDL, LABVLDL, LDLCALC, CHOLHDL in the last 168 hours.  Hematology Recent Labs  Lab 08/14/21 0655 08/15/21 0257 08/16/21 0142  WBC 8.2 9.8 10.6*  RBC 4.49 4.56 4.58  HGB 13.9 14.3 14.1  HCT 40.8 40.7 41.4  MCV 90.9 89.3 90.4  MCH 31.0 31.4 30.8  MCHC 34.1 35.1 34.1  RDW 13.7 13.7 13.6  PLT 235 245 226   Thyroid No results for input(s): TSH, FREET4 in the last 168 hours.  BNP Recent Labs  Lab 08/13/21 0800  BNP 131.6*    DDimer No results for input(s): DDIMER in the last 168 hours.   Radiology    ECHOCARDIOGRAM COMPLETE  Result Date: 08/14/2021    ECHOCARDIOGRAM REPORT   Patient Name:   EXODUS KUTZER Date of Exam: 08/14/2021 Medical Rec #:  [id number]      Height:       75.0 in Accession #:    [id number]     Weight:       301.6 lb Date of Birth:  [birth year]      BSA:          2.613 m Patient Age:    46 years       BP:           150/108 mmHg Patient Gender: M              HR:           75 bpm. Exam Location:  Inpatient Procedure: 2D Echo, 3D Echo, Cardiac Doppler and Color Doppler Indications:    R07.9* Chest pain, unspecified  History:        Patient has prior history of Echocardiogram examinations, most                 recent 03/12/2020. CHF, Previous Myocardial Infarction,                 Signs/Symptoms:Shortness of Breath and Dyspnea; Risk                  Factors:Hypertension and Dyslipidemia.  Sonographer:    Sheralyn Boatman RDCS Referring Phys: [id number] [name]  Sonographer Comments: Image acquisition challenging due to patient body habitus. IMPRESSIONS  1. Left ventricular ejection fraction, by estimation, is 30 to 35%. The left ventricle has moderately decreased function. The left ventricle demonstrates global hypokinesis. There is moderate left ventricular hypertrophy. Left ventricular diastolic parameters are consistent with Grade II diastolic dysfunction (pseudonormalization).  2. Right ventricular systolic function is normal. The right ventricular size is normal. Tricuspid regurgitation signal is inadequate for assessing PA pressure.  3. Functional MR. Mild to moderate mitral valve regurgitation.  4. Aortic valve regurgitation is not visualized.  5. Aortic small ascending aortic aneurysm 4.3 cm.  6. The inferior vena cava is normal in size with greater than 50% respiratory variability, suggesting right atrial pressure of 3 mmHg. Comparison(s): No significant change from prior study. FINDINGS  Left Ventricle: Left ventricular ejection fraction, by estimation, is 30 to 35%. The left ventricle has moderately decreased function. The left ventricle demonstrates global hypokinesis. The left ventricular internal cavity size was normal in size. There is moderate left ventricular hypertrophy. Left ventricular diastolic parameters are consistent with Grade II diastolic dysfunction (pseudonormalization). Right Ventricle: The right ventricular size is normal. Right ventricular systolic function is normal. Tricuspid regurgitation signal is inadequate for assessing PA pressure. Left Atrium: Left atrial size was normal in size. Right Atrium: Right atrial size was normal in size. Pericardium: There is no evidence of pericardial effusion. Mitral Valve: Functional MR. Mild to moderate mitral valve regurgitation. Tricuspid Valve: Tricuspid valve regurgitation is not  demonstrated. Aortic Valve: Aortic  valve regurgitation is not visualized. Pulmonic Valve: Pulmonic valve regurgitation is not visualized. Aorta: Small ascending aortic aneurysm 4.3 cm. Venous: The inferior vena cava is normal in size with greater than 50% respiratory variability, suggesting right atrial pressure of 3 mmHg. IAS/Shunts: No atrial level shunt detected by color flow Doppler.  LEFT VENTRICLE PLAX 2D LVIDd:         6.60 cm      Diastology LVIDs:         6.00 cm      LV e' lateral:   6.64 cm/s LV PW:         1.60 cm      LV E/e' lateral: 12.2 LV IVS:        1.50 cm LVOT diam:     2.50 cm LV SV:         65 LV SV Index:   25 LVOT Area:     4.91 cm  LV Volumes (MOD) LV vol d, MOD A2C: 294.0 ml LV vol d, MOD A4C: 289.0 ml LV vol s, MOD A2C: 197.0 ml LV vol s, MOD A4C: 220.0 ml LV SV MOD A2C:     97.0 ml LV SV MOD A4C:     289.0 ml LV SV MOD BP:      92.6 ml RIGHT VENTRICLE             IVC RV S prime:     11.20 cm/s  IVC diam: 2.40 cm TAPSE (M-mode): 1.7 cm LEFT ATRIUM              Index        RIGHT ATRIUM           Index LA diam:        5.50 cm  2.10 cm/m   RA Area:     16.30 cm LA Vol (A2C):   100.0 ml 38.27 ml/m  RA Volume:   37.70 ml  14.43 ml/m LA Vol (A4C):   76.8 ml  29.39 ml/m LA Biplane Vol: 90.1 ml  34.48 ml/m  AORTIC VALVE LVOT Vmax:   78.80 cm/s LVOT Vmean:  51.300 cm/s LVOT VTI:    0.133 m  AORTA Ao Root diam: 4.00 cm Ao Asc diam:  4.20 cm MITRAL VALVE MV Area (PHT): 4.37 cm    SHUNTS MV Decel Time: 174 msec    Systemic VTI:  0.13 m MV E velocity: 81.13 cm/s  Systemic Diam: 2.50 cm MV A velocity: 51.45 cm/s MV E/A ratio:  1.58 Halford Decamp signed by Carolan Clines Signature Date/Time: 08/14/2021/12:33:29 PM    Final     Cardiac Studies   CT coronary 12/01/2020 IMPRESSION:  -  Calcium score of 32.  This places the patient in the 58 percentile for age and race based on the [place] data base.  -  No significant CAD noted but all 3 major coronary arteries are larger in diameter  than usual. 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 Mid section of the RCA has a narrow segment at the angle.  - Mildly dilated ascending aorta  -  An addendum commenting on non-cardiac structures to follow.  TTE 30-35%  08/14/2021 1. Left ventricular ejection fraction, by estimation, is 30 to 35%. The  left ventricle has moderately decreased function. The left ventricle  demonstrates global hypokinesis. The left ventricular internal cavity size  was moderately dilated. There is  moderate left ventricular hypertrophy. Left ventricular diastolic  parameters are consistent  with Grade I diastolic dysfunction (impaired  relaxation).   2. The mitral valve is normal in structure. Mild mitral valve  regurgitation.   3. The aortic valve is normal in structure. Aortic valve regurgitation is  not visualized. No aortic stenosis is present.   4. The inferior vena cava is normal in size with greater than 50%  respiratory variability, suggesting right atrial pressure of 3 mmHg.  TTE 03/12/2020  1. Left ventricular ejection fraction, by estimation, is 30 to 35%. The  left ventricle has moderately decreased function. The left ventricle  demonstrates global hypokinesis. The left ventricular internal cavity size  was moderately dilated. There is  moderate left ventricular hypertrophy. Left ventricular diastolic  parameters are consistent with Grade I diastolic dysfunction (impaired  relaxation).   2. The mitral valve is normal in structure. Mild mitral valve  regurgitation.   3. The aortic valve is normal in structure. Aortic valve regurgitation is  not visualized. No aortic stenosis is present.   4. The inferior vena cava is normal in size with greater than 50%  respiratory variability, suggesting right atrial pressure of 3 mmHg.  Patient Profile     PAIGE VANDERWOUDE is a 46 y.o. 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 Anitra Lauth.  Assessment & Plan   NSTEMI: he has a significant delta trop and p/w chest pain. His EKG is stable. He is not having a STEMI. He is euvolemic. No CHF exacerbation. No persistent significant angina - cath today - continue heparin gtt - continue nitro gtt [ can wean after his LHC] - continue asa 81 mg daily - continue coreg 6.25 mg BID - started atorvastatin 80 mg daily - started lasix 40 mg daily - pending LP(a) - nitro SL PRN  Shared Decision Making/Informed Consent The risks [stroke (1 in 1000), death (1 in 1000), kidney failure [usually temporary] (1 in 500), bleeding (1 in 200), allergic reaction [possibly serious] (1 in 200)], benefits (diagnostic support and management of coronary artery disease) and alternatives of a cardiac catheterization were discussed in detail with Mr. Buffalo and he is willing to proceed.  Hypertensive Emergency: He is HDS. BP are more improved; has hx of challenging to control Bps with loss of insurance to pay for is medications.  In the setting of NSTEMI.  - BB per above - continue hydralazine and imdur   For questions or updates, please contact [facility] Please consult [email or web address] for contact info under        Signed, Maisie Fus, MD  08/16/2021, 8:22 AM

## 2021-08-16 NOTE — Discharge Summary (Signed)
Physician Discharge Summary  Joseph Ray UQJ:[id number] DOB: 10-18-1975 DOA: 08/13/2021  PCP: [facility]  Admit date: 08/13/2021 Discharge date: 08/16/2021  Admitted From: Home Disposition: Home  Recommendations for Outpatient Follow-up:  Follow up with PCP in 1-2 weeks Follow-up with cardiology as scheduled  Home Health: None Equipment/Devices: None  Discharge Condition: Stable CODE STATUS: Full Diet recommendation: Low-salt low-fat fluid restricted cardiac diet  Brief/Interim Summary: Joseph Ray is a 46 y.o. 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 Patient has been compliant with his CHF medication including Entresto until February 2023 when patient lost his job and insurance. Now presenting with worsening symptoms of chest pain, edema, shortness of breath and fatigue.  NSTEMI HTN emergency in the setting of noncompliance, POA -Blood pressure currently controlled on new regimen including carvedilol, lasix, hydralazine, isosorbide -Cardiology following -appreciate insight and recommendations -Aspirin and Statin ongoing -Left heart cath 08/16/21 shows persistent thrombus in the left circumflex not amenable to PCI, cardiology recommending medical management with dual antiplatelet therapy and aggressive blood pressure control as outlined above.   Acute on chronic systolic CHF decompensation -Currently appears euvolemic, continue medications as below including carvedilol, Lasix, lisinopril -not currently on Entresto given prohibitive cost due to loss insurance   Elevated glucose -A1C 6.0 -Follow clinically   Obesity -Improve diet/lifestyle as discussed -Outpatient sleep study to rule out OSA  Medication noncompliance -Patient unfortunately lost his job with accompanying insurance and was  subsequently unable to afford his medications which is likely the etiology of his above chest pain, NSTEMI and worsening heart failure decompensation.  Lengthy discussion at bedside about need to continue to follow with PCP and cardiology as well as take medications.  If compliance due to cost becomes an issue we recommended patient follow-up with case management/social worker and enroll in insurance as previously discussed per their recommendations.  Discharge Diagnoses:  Principal Problem:   NSTEMI (non-ST elevated myocardial infarction) (HCC) Active Problems:   Malignant HTN with heart disease, w/o CHF, 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    Discharge Instructions  Discharge Instructions     Amb Referral to Cardiac Rehabilitation   Complete by: As directed    Diagnosis: NSTEMI   After initial evaluation and assessments completed: Virtual Based Care may be provided alone or in conjunction with Phase 2 Cardiac Rehab based on patient barriers.: Yes   Discharge patient   Complete by: As directed    Discharge disposition: 01-Home or Self Care   Discharge patient date: 08/16/2021        Follow-up Information     Merrilyn Puma, MD Follow up on 08/18/2021.   Specialty: Internal Medicine Why: ER Follow-up, Time 0945, Please arrive 15 min early to complete paperwork Contact information: [address] [place] [address] [phone]                Consultations: Cardiology  Procedures/Studies: CARDIAC CATHETERIZATION  Result Date: 08/16/2021   Dist RCA lesion is 50% stenosed, after more proximal aneurysm.   Mid Cx lesion is 99% stenosed.  Heavy thrombus and an aneurysmal segment.  Culprit, but not a candudate for PCI.   LV end diastolic pressure is mildly elevated.   There is no aortic valve stenosis.   Severe, right subclavian tortuosity which made torquing catheters somewhat difficult.  The left system was straightforward to engage, but the  RCA was more difficult, but ultimately  successful.  Could consider destination sheath if using the right radial approach in the future, but this was not needed today. 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  The mid circumflex appears to be the culprit.  This large aneurysmal segment appears to be filled with thrombus.  There is some flow through the circumflex to fill the distal vessel.  There severe retroflexion of the circumflex as well.  I do not think intervention would be possible given the angulation and the large burden of thrombus.  He has not had pain in 2 days.  Would treat with dual antiplatelet therapy and aggressive secondary prevention, along with medical therapy for his heart failure.  No other severe disease noted. Consider indefinite dual antiplatelet therapy.   DG Chest Port 1 View  Result Date: 08/13/2021 CLINICAL DATA:  Dyspnea with exertion, chest pain. EXAM: PORTABLE CHEST 1 VIEW COMPARISON:  June 13, 2020. FINDINGS: Stable cardiomediastinal silhouette. Both lungs are clear. The visualized skeletal structures are unremarkable. IMPRESSION: No active disease. Electronically Signed   By: Lupita Raider M.D.   On: 08/13/2021 08:58   ECHOCARDIOGRAM COMPLETE  Result Date: 08/14/2021    ECHOCARDIOGRAM REPORT   Patient Name:   Joseph Ray Date of Exam: 08/14/2021 Medical Rec #:  [id number]      Height:       75.0 in Accession #:    [id number]     Weight:       301.6 lb Date of Birth:  07/28/1975      BSA:          2.613 m Patient Age:    45 years       BP:           150/108 mmHg Patient Gender: M              HR:           75 bpm. Exam Location:  Inpatient Procedure: 2D Echo, 3D Echo, Cardiac Doppler and Color Doppler Indications:    R07.9* Chest pain, unspecified  History:        Patient has prior history of Echocardiogram examinations, most                 recent 03/12/2020. CHF, Previous Myocardial Infarction,                 Signs/Symptoms:Shortness of Breath and  Dyspnea; Risk                 Factors:Hypertension and Dyslipidemia.  Sonographer:    Sheralyn Boatman RDCS Referring Phys: [id number] [facility]  Sonographer Comments: Image acquisition challenging due to patient body habitus. IMPRESSIONS  1. Left ventricular ejection fraction, by estimation, is 30 to 35%. The left ventricle has moderately decreased function. The left ventricle demonstrates global hypokinesis. There is moderate left ventricular hypertrophy. Left ventricular diastolic parameters are consistent with Grade II diastolic dysfunction (pseudonormalization).  2. Right ventricular systolic function is normal. The right ventricular size is normal. Tricuspid regurgitation signal is inadequate for assessing PA pressure.  3. Functional MR. Mild to moderate mitral valve regurgitation.  4. Aortic valve regurgitation is not visualized.  5. Aortic small ascending aortic aneurysm 4.3 cm.  6. The inferior vena cava is normal in size with greater than 50% respiratory variability, suggesting right atrial pressure of 3 mmHg. Comparison(s): No significant change from prior study. FINDINGS  Left  Ventricle: Left ventricular ejection fraction, by estimation, is 30 to 35%. The left ventricle has moderately decreased function. The left ventricle demonstrates global hypokinesis. The left ventricular internal cavity size was normal in size. There is moderate left ventricular hypertrophy. Left ventricular diastolic parameters are consistent with Grade II diastolic dysfunction (pseudonormalization). Right Ventricle: The right ventricular size is normal. Right ventricular systolic function is normal. Tricuspid regurgitation signal is inadequate for assessing PA pressure. Left Atrium: Left atrial size was normal in size. Right Atrium: Right atrial size was normal in size. Pericardium: There is no evidence of pericardial effusion. Mitral Valve: Functional MR. Mild to moderate mitral valve regurgitation. Tricuspid Valve: Tricuspid  valve regurgitation is not demonstrated. Aortic Valve: Aortic valve regurgitation is not visualized. Pulmonic Valve: Pulmonic valve regurgitation is not visualized. Aorta: Small ascending aortic aneurysm 4.3 cm. Venous: The inferior vena cava is normal in size with greater than 50% respiratory variability, suggesting right atrial pressure of 3 mmHg. IAS/Shunts: No atrial level shunt detected by color flow Doppler.  LEFT VENTRICLE PLAX 2D LVIDd:         6.60 cm      Diastology LVIDs:         6.00 cm      LV e' lateral:   6.64 cm/s LV PW:         1.60 cm      LV E/e' lateral: 12.2 LV IVS:        1.50 cm LVOT diam:     2.50 cm LV SV:         65 LV SV Index:   25 LVOT Area:     4.91 cm  LV Volumes (MOD) LV vol d, MOD A2C: 294.0 ml LV vol d, MOD A4C: 289.0 ml LV vol s, MOD A2C: 197.0 ml LV vol s, MOD A4C: 220.0 ml LV SV MOD A2C:     97.0 ml LV SV MOD A4C:     289.0 ml LV SV MOD BP:      92.6 ml RIGHT VENTRICLE             IVC RV S prime:     11.20 cm/s  IVC diam: 2.40 cm TAPSE (M-mode): 1.7 cm LEFT ATRIUM              Index        RIGHT ATRIUM           Index LA diam:        5.50 cm  2.10 cm/m   RA Area:     16.30 cm LA Vol (A2C):   100.0 ml 38.27 ml/m  RA Volume:   37.70 ml  14.43 ml/m LA Vol (A4C):   76.8 ml  29.39 ml/m LA Biplane Vol: 90.1 ml  34.48 ml/m  AORTIC VALVE LVOT Vmax:   78.80 cm/s LVOT Vmean:  51.300 cm/s LVOT VTI:    0.133 m  AORTA Ao Root diam: 4.00 cm Ao Asc diam:  4.20 cm MITRAL VALVE MV Area (PHT): 4.37 cm    SHUNTS MV Decel Time: 174 msec    Systemic VTI:  0.13 m MV E velocity: 81.13 cm/s  Systemic Diam: 2.50 cm MV A velocity: 51.45 cm/s MV E/A ratio:  1.58 Photographer signed by Carolan Clines Signature Date/Time: 08/14/2021/12:33:29 PM    Final      Subjective: No acute issues or events overnight, tolerated procedure well denies nausea vomiting diarrhea constipation headache fevers chills shortness of breath or chest pain.  Discharge Exam: Vitals:   08/16/21 1227 08/16/21  1242  BP: 122/90 (!) 131/94  Pulse: 81 79  Resp: 15 19  Temp:    SpO2: 96% 95%   Vitals:   08/16/21 1200 08/16/21 1212 08/16/21 1227 08/16/21 1242  BP: 118/90 (!) 124/92 122/90 (!) 131/94  Pulse: 81 80 81 79  Resp: (!) Temp:      TempSrc:      SpO2: 95% 94% 96% 95%  Weight:      Height:        General: Pt is alert, awake, not in acute distress Cardiovascular: RRR, S1/S2 +, no rubs, no gallops Respiratory: CTA bilaterally, no wheezing, no rhonchi Abdominal: Soft, NT, ND, bowel sounds + Extremities: no edema, no cyanosis    The results of significant diagnostics from this hospitalization (including imaging, microbiology, ancillary and laboratory) are listed below for reference.     Microbiology: No results found for this or any previous visit (from the past 240 hour(s)).   Labs: BNP (last 3 results) Recent Labs    08/13/21 0800  BNP 131.6*   Basic Metabolic Panel: Recent Labs  Lab 08/13/21 0800 08/15/21 0257 08/16/21 0142  NA 140 136 137  K 3.7 3.5 4.0  CL 107 103 102  CO2 GLUCOSE 127* 116* 117*  BUN CREATININE 1.11 1.17 1.34*  CALCIUM 9.4 9.1 8.8*   Liver Function Tests: Recent Labs  Lab 08/13/21 0800  AST 22  ALT 23  ALKPHOS 38  BILITOT 0.7  PROT 7.9  ALBUMIN 4.1   No results for input(s): LIPASE, AMYLASE in the last 168 hours. No results for input(s): AMMONIA in the last 168 hours. CBC: Recent Labs  Lab 08/13/21 0800 08/14/21 0409 08/14/21 0655 08/15/21 0257 08/16/21 0142  WBC 5.1 8.4 8.2 9.8 10.6*  NEUTROABS 2.7  --   --   --   --   HGB 14.7 14.1 13.9 14.3 14.1  HCT 43.1 41.8 40.8 40.7 41.4  MCV 90.7 91.7 90.9 89.3 90.4  PLT 240 271 235 245 226   Cardiac Enzymes: No results for input(s): CKTOTAL, CKMB, CKMBINDEX, TROPONINI in the last 168 hours. BNP: Invalid input(s): POCBNP CBG: Recent Labs  Lab 08/15/21 0828 08/15/21 1243 08/16/21 0003 08/16/21 0608 08/16/21 1149  GLUCAP 109* 97 131*  113* 119*   D-Dimer No results for input(s): DDIMER in the last 72 hours. Hgb A1c Recent Labs    08/13/21 2030  HGBA1C 6.0*   Lipid Profile No results for input(s): CHOL, HDL, LDLCALC, TRIG, CHOLHDL, LDLDIRECT in the last 72 hours. Thyroid function studies No results for input(s): TSH, T4TOTAL, T3FREE, THYROIDAB in the last 72 hours.  Invalid input(s): FREET3 Anemia work up No results for input(s): VITAMINB12, FOLATE, FERRITIN, TIBC, IRON, RETICCTPCT in the last 72 hours. Urinalysis    Component Value Date/Time   COLORURINE YELLOW 08/06/2020 1528   APPEARANCEUR CLEAR 08/06/2020 1528   LABSPEC 1.020 08/06/2020 1528   PHURINE 5.0 08/06/2020 1528   GLUCOSEU NEGATIVE 08/06/2020 1528   HGBUR NEGATIVE 08/06/2020 1528   BILIRUBINUR NEGATIVE 08/06/2020 1528   KETONESUR NEGATIVE 08/06/2020 1528   PROTEINUR 100 (A) 08/06/2020 1528   NITRITE NEGATIVE 08/06/2020 1528   LEUKOCYTESUR NEGATIVE 08/06/2020 1528   Sepsis Labs Invalid input(s): PROCALCITONIN,  WBC,  LACTICIDVEN Microbiology No results found for this or any previous visit (from the past 240 hour(s)).   Time coordinating discharge: Over 30 minutes  SIGNED:  Azucena Fallen, DO [facility] 08/16/2021, 5:22 PM Pager   If 7PM-7AM, please contact night-coverage [email or web address]

## 2021-08-16 NOTE — H&P (View-Only) (Signed)
Progress Note  Patient Name: DEVONDRE ACKERMAN Date of Encounter: 08/16/2021  [id number] [facility] Cardiologist: Jodelle Red, MD   Subjective   He had chest pressure 6/3 necessitating restart of his nitro gtt. He continues to have some improvement. Pending LHC  Inpatient Medications    Scheduled Meds:  aspirin EC  81 mg Oral Daily   atorvastatin  80 mg Oral Daily   carvedilol  6.25 mg Oral BID WC   furosemide  40 mg Oral Daily   hydrALAZINE  25 mg Oral TID   isosorbide dinitrate  10 mg Oral TID   sodium chloride flush  3 mL Intravenous Q12H   Continuous Infusions:  sodium chloride     sodium chloride 10 mL/hr at 08/16/21 0616   heparin 2,100 Units/hr (08/15/21 2041)   nitroGLYCERIN Stopped (08/16/21 0714)   PRN Meds: sodium chloride, acetaminophen, hydrALAZINE, ondansetron (ZOFRAN) IV, sodium chloride flush   Vital Signs    Vitals:   08/15/21 1708 08/15/21 1710 08/15/21 2210 08/16/21 0605  BP: 127/89 127/89 133/83 (!) 135/98  Pulse: 91 87  82  Resp: 18   17  Temp:    98.9 F (37.2 C)  TempSrc: Oral   Oral  SpO2:  98%  96%  Weight:      Height:        Intake/Output Summary (Last 24 hours) at 08/16/2021 0822 Last data filed at 08/16/2021 0443 Gross per 24 hour  Intake 494.74 ml  Output 1250 ml  Net -755.26 ml      08/15/2021    4:23 AM 08/14/2021    4:39 AM 08/13/2021    7:47 AM  Last 3 Weights  Weight (lbs) 299 lb 13.2 oz 301 lb 9.4 oz 305 lb  Weight (kg) 136 kg 136.8 kg 138.347 kg      Telemetry    NSR - Personally Reviewed  ECG    NA- Personally Reviewed  Physical Exam   Vitals:   08/15/21 2210 08/16/21 0605  BP: 133/83 (!) 135/98  Pulse:  82  Resp:  17  Temp:  98.9 F (37.2 C)  SpO2:  96%    GEN: No acute distress.   Neck: No JVD Cardiac: RRR, no murmurs, rubs, or gallops.  Respiratory: Clear to auscultation bilaterally. GI: Soft, nontender, non-distended  MS: No edema; No deformity. VASC: 2+ radial pulses Neuro:  Nonfocal  Psych:  Normal affect   Labs    High Sensitivity Troponin:   Recent Labs  Lab 08/13/21 0800 08/13/21 1048 08/13/21 1346 08/13/21 1541 08/14/21 0125  TROPONINIHS 59* 164* 383* 569* 4,844*     Chemistry Recent Labs  Lab 08/13/21 0800 08/15/21 0257 08/16/21 0142  NA 140 136 137  K 3.7 3.5 4.0  CL 107 103 102  CO2 25 24 26   GLUCOSE 127* 116* 117*  BUN 12 12 14   CREATININE 1.11 1.17 1.34*  CALCIUM 9.4 9.1 8.8*  PROT 7.9  --   --   ALBUMIN 4.1  --   --   AST 22  --   --   ALT 23  --   --   ALKPHOS 38  --   --   BILITOT 0.7  --   --   GFRNONAA >60 >60 >60  ANIONGAP 8 9 9     Lipids No results for input(s): CHOL, TRIG, HDL, LABVLDL, LDLCALC, CHOLHDL in the last 168 hours.  Hematology Recent Labs  Lab 08/14/21 0655 08/15/21 0257 08/16/21 0142  WBC 8.2 9.8 10.6*  RBC 4.49 4.56 4.58  HGB 13.9 14.3 14.1  HCT 40.8 40.7 41.4  MCV 90.9 89.3 90.4  MCH 31.0 31.4 30.8  MCHC 34.1 35.1 34.1  RDW 13.7 13.7 13.6  PLT 235 245 226   Thyroid No results for input(s): TSH, FREET4 in the last 168 hours.  BNP Recent Labs  Lab 08/13/21 0800  BNP 131.6*    DDimer No results for input(s): DDIMER in the last 168 hours.   Radiology    ECHOCARDIOGRAM COMPLETE  Result Date: 08/14/2021    ECHOCARDIOGRAM REPORT   Patient Name:   Ralph C Abundis Date of Exam: 08/14/2021 Medical Rec #:  [id number]      Height:       75.0 in Accession #:    [id number]     Weight:       301.6 lb Date of Birth:  08/13/1975      BSA:          2.613 m Patient Age:    46 years       BP:           150/108 mmHg Patient Gender: M              HR:           75 bpm. Exam Location:  Inpatient Procedure: 2D Echo, 3D Echo, Cardiac Doppler and Color Doppler Indications:    R07.9* Chest pain, unspecified  History:        Patient has prior history of Echocardiogram examinations, most                 recent 03/12/2020. CHF, Previous Myocardial Infarction,                 Signs/Symptoms:Shortness of Breath and Dyspnea; Risk                  Factors:Hypertension and Dyslipidemia.  Sonographer:    Tina West RDCS Referring Phys: [id number] BRIDGETTE CHRISTOPHER  Sonographer Comments: Image acquisition challenging due to patient body habitus. IMPRESSIONS  1. Left ventricular ejection fraction, by estimation, is 30 to 35%. The left ventricle has moderately decreased function. The left ventricle demonstrates global hypokinesis. There is moderate left ventricular hypertrophy. Left ventricular diastolic parameters are consistent with Grade II diastolic dysfunction (pseudonormalization).  2. Right ventricular systolic function is normal. The right ventricular size is normal. Tricuspid regurgitation signal is inadequate for assessing PA pressure.  3. Functional MR. Mild to moderate mitral valve regurgitation.  4. Aortic valve regurgitation is not visualized.  5. Aortic small ascending aortic aneurysm 4.3 cm.  6. The inferior vena cava is normal in size with greater than 50% respiratory variability, suggesting right atrial pressure of 3 mmHg. Comparison(s): No significant change from prior study. FINDINGS  Left Ventricle: Left ventricular ejection fraction, by estimation, is 30 to 35%. The left ventricle has moderately decreased function. The left ventricle demonstrates global hypokinesis. The left ventricular internal cavity size was normal in size. There is moderate left ventricular hypertrophy. Left ventricular diastolic parameters are consistent with Grade II diastolic dysfunction (pseudonormalization). Right Ventricle: The right ventricular size is normal. Right ventricular systolic function is normal. Tricuspid regurgitation signal is inadequate for assessing PA pressure. Left Atrium: Left atrial size was normal in size. Right Atrium: Right atrial size was normal in size. Pericardium: There is no evidence of pericardial effusion. Mitral Valve: Functional MR. Mild to moderate mitral valve regurgitation. Tricuspid Valve: Tricuspid valve regurgitation is not  demonstrated. Aortic Valve: Aortic   valve regurgitation is not visualized. Pulmonic Valve: Pulmonic valve regurgitation is not visualized. Aorta: Small ascending aortic aneurysm 4.3 cm. Venous: The inferior vena cava is normal in size with greater than 50% respiratory variability, suggesting right atrial pressure of 3 mmHg. IAS/Shunts: No atrial level shunt detected by color flow Doppler.  LEFT VENTRICLE PLAX 2D LVIDd:         6.60 cm      Diastology LVIDs:         6.00 cm      LV e' lateral:   6.64 cm/s LV PW:         1.60 cm      LV E/e' lateral: 12.2 LV IVS:        1.50 cm LVOT diam:     2.50 cm LV SV:         65 LV SV Index:   25 LVOT Area:     4.91 cm  LV Volumes (MOD) LV vol d, MOD A2C: 294.0 ml LV vol d, MOD A4C: 289.0 ml LV vol s, MOD A2C: 197.0 ml LV vol s, MOD A4C: 220.0 ml LV SV MOD A2C:     97.0 ml LV SV MOD A4C:     289.0 ml LV SV MOD BP:      92.6 ml RIGHT VENTRICLE             IVC RV S prime:     11.20 cm/s  IVC diam: 2.40 cm TAPSE (M-mode): 1.7 cm LEFT ATRIUM              Index        RIGHT ATRIUM           Index LA diam:        5.50 cm  2.10 cm/m   RA Area:     16.30 cm LA Vol (A2C):   100.0 ml 38.27 ml/m  RA Volume:   37.70 ml  14.43 ml/m LA Vol (A4C):   76.8 ml  29.39 ml/m LA Biplane Vol: 90.1 ml  34.48 ml/m  AORTIC VALVE LVOT Vmax:   78.80 cm/s LVOT Vmean:  51.300 cm/s LVOT VTI:    0.133 m  AORTA Ao Root diam: 4.00 cm Ao Asc diam:  4.20 cm MITRAL VALVE MV Area (PHT): 4.37 cm    SHUNTS MV Decel Time: 174 msec    Systemic VTI:  0.13 m MV E velocity: 81.13 cm/s  Systemic Diam: 2.50 cm MV A velocity: 51.45 cm/s MV E/A ratio:  1.58 Halford Decamp signed by Carolan Clines Signature Date/Time: 08/14/2021/12:33:29 PM    Final     Cardiac Studies   CT coronary 12/01/2020 IMPRESSION:  -  Calcium score of 32.  This places the patient in the 58 percentile for age and race based on the [place] data base.  -  No significant CAD noted but all 3 major coronary arteries are larger in diameter  than usual. 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 Mid section of the RCA has a narrow segment at the angle.  - Mildly dilated ascending aorta  -  An addendum commenting on non-cardiac structures to follow.  TTE 30-35%  08/14/2021 1. Left ventricular ejection fraction, by estimation, is 30 to 35%. The  left ventricle has moderately decreased function. The left ventricle  demonstrates global hypokinesis. The left ventricular internal cavity size  was moderately dilated. There is  moderate left ventricular hypertrophy. Left ventricular diastolic  parameters are consistent  with Grade I diastolic dysfunction (impaired  relaxation).   2. The mitral valve is normal in structure. Mild mitral valve  regurgitation.   3. The aortic valve is normal in structure. Aortic valve regurgitation is  not visualized. No aortic stenosis is present.   4. The inferior vena cava is normal in size with greater than 50%  respiratory variability, suggesting right atrial pressure of 3 mmHg.  TTE 03/12/2020  1. Left ventricular ejection fraction, by estimation, is 30 to 35%. The  left ventricle has moderately decreased function. The left ventricle  demonstrates global hypokinesis. The left ventricular internal cavity size  was moderately dilated. There is  moderate left ventricular hypertrophy. Left ventricular diastolic  parameters are consistent with Grade I diastolic dysfunction (impaired  relaxation).   2. The mitral valve is normal in structure. Mild mitral valve  regurgitation.   3. The aortic valve is normal in structure. Aortic valve regurgitation is  not visualized. No aortic stenosis is present.   4. The inferior vena cava is normal in size with greater than 50%  respiratory variability, suggesting right atrial pressure of 3 mmHg.  Patient Profile     PAIGE VANDERWOUDE is a 46 y.o. 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 Anitra Lauth.  Assessment & Plan   NSTEMI: he has a significant delta trop and p/w chest pain. His EKG is stable. He is not having a STEMI. He is euvolemic. No CHF exacerbation. No persistent significant angina - cath today - continue heparin gtt - continue nitro gtt [ can wean after his LHC] - continue asa 81 mg daily - continue coreg 6.25 mg BID - started atorvastatin 80 mg daily - started lasix 40 mg daily - pending LP(a) - nitro SL PRN  Shared Decision Making/Informed Consent The risks [stroke (1 in 1000), death (1 in 1000), kidney failure [usually temporary] (1 in 500), bleeding (1 in 200), allergic reaction [possibly serious] (1 in 200)], benefits (diagnostic support and management of coronary artery disease) and alternatives of a cardiac catheterization were discussed in detail with Mr. [place] and he is willing to proceed.  Hypertensive Emergency: He is HDS. BP are more improved; has hx of challenging to control Bps with loss of insurance to pay for is medications.  In the setting of NSTEMI.  - BB per above - continue hydralazine and imdur   For questions or updates, please contact [facility] Please consult [email or web address] for contact info under        Signed, Maisie Fus, MD  08/16/2021, 8:22 AM

## 2021-08-16 NOTE — Progress Notes (Addendum)
Pt for d/c, off monitor. No CP although hasn't been up. Getting ready to get dressed. Discussed with pt MI, restrictions, daily wts, diet, exercise, smoking cessation, NTG (not prescribed), and CRPII. Pt receptive. Encouraged to call MD if CP or 911 if severe. Will refer to [place] however pt currently does not have insurance (in the midst of setting up). [place], ACSM 08/16/2021 3:30 PM

## 2021-08-16 NOTE — Interval H&P Note (Signed)
Cath Lab Visit (complete for each Cath Lab visit)  Clinical Evaluation Leading to the Procedure:   ACS: Yes.    Non-ACS:    Anginal Classification: CCS IV  Anti-ischemic medical therapy: Minimal Therapy (1 class of medications)  Non-Invasive Test Results: No non-invasive testing performed  EF 30%  Prior CABG: No previous CABG      History and Physical Interval Note:  08/16/2021 10:46 AM  Joseph Ray  has presented today for surgery, with the diagnosis of NSTEMI.  The various methods of treatment have been discussed with the patient and family. After consideration of risks, benefits and other options for treatment, the patient has consented to  Procedure(s): LEFT HEART CATH AND CORONARY ANGIOGRAPHY (N/A) as a surgical intervention.  The patient's history has been reviewed, patient examined, no change in status, stable for surgery.  I have reviewed the patient's chart and labs.  Questions were answered to the patient's satisfaction.     Lance Muss

## 2021-08-16 NOTE — Care Management (Signed)
  Transition of Care [facility]) Screening Note   Patient Details  Name: DAULTON BRITTS Date of Birth: 1975/10/24   Transition of Care [facility]) CM/SW Contact:    Bethena Roys, RN Phone Number: 08/16/2021, 12:08 PM    Transition of Care Department [facility]) has reviewed the patient and no TOC needs have been identified at this time. Patient was previously assisted via the ED Case Manager. Patient utilized [facility] on 08-13-21 and will not be able to utilize [facility] this admission. Patient is without insurance and previous Case Manager scheduled an appointment at [facility] for 08-18-21-patient will need to make sure he is present to that appointment. No further needs from Case Manager at this time.

## 2021-08-18 ENCOUNTER — Encounter: Payer: Self-pay | Admitting: Student

## 2021-08-18 ENCOUNTER — Other Ambulatory Visit: Payer: Self-pay

## 2021-08-18 ENCOUNTER — Other Ambulatory Visit (HOSPITAL_COMMUNITY): Payer: Self-pay

## 2021-08-18 ENCOUNTER — Ambulatory Visit (INDEPENDENT_AMBULATORY_CARE_PROVIDER_SITE_OTHER): Payer: Self-pay | Admitting: Student

## 2021-08-18 VITALS — BP 136/92 | HR 84 | Temp 98.3°F | Ht 75.0 in | Wt 292.7 lb

## 2021-08-18 DIAGNOSIS — R7303 Prediabetes: Secondary | ICD-10-CM | POA: Insufficient documentation

## 2021-08-18 DIAGNOSIS — I5042 Chronic combined systolic (congestive) and diastolic (congestive) heart failure: Secondary | ICD-10-CM

## 2021-08-18 DIAGNOSIS — Z0189 Encounter for other specified special examinations: Secondary | ICD-10-CM | POA: Insufficient documentation

## 2021-08-18 DIAGNOSIS — F17210 Nicotine dependence, cigarettes, uncomplicated: Secondary | ICD-10-CM

## 2021-08-18 DIAGNOSIS — I214 Non-ST elevation (NSTEMI) myocardial infarction: Secondary | ICD-10-CM

## 2021-08-18 DIAGNOSIS — I10 Essential (primary) hypertension: Secondary | ICD-10-CM

## 2021-08-18 DIAGNOSIS — E669 Obesity, unspecified: Secondary | ICD-10-CM

## 2021-08-18 DIAGNOSIS — I110 Hypertensive heart disease with heart failure: Secondary | ICD-10-CM

## 2021-08-18 DIAGNOSIS — Z6836 Body mass index (BMI) 36.0-36.9, adult: Secondary | ICD-10-CM

## 2021-08-18 DIAGNOSIS — I77810 Thoracic aortic ectasia: Secondary | ICD-10-CM

## 2021-08-18 MED ORDER — LISINOPRIL 40 MG PO TABS
40.0000 mg | ORAL_TABLET | Freq: Every day | ORAL | 5 refills | Status: DC
  Filled 2021-08-18: qty 30, 30d supply, fill #0

## 2021-08-18 NOTE — Assessment & Plan Note (Signed)
Patient was recently hospitalized for NSTEMI and hypertensive emergency in the setting of medication nonadherence (due to loss of insurance and subsequent prohibitive cost). At time of discharge, blood pressure was controlled on new regimen of coreg 6.25mg  BID, lisinopril 20mg  daily, hydralazine 25mg  TID, imdur 10mg  TID, and lasix 40mg  daily.   Blood pressure mildly elevated above goal today at 141/87 and repeat at 136/92. He notes strict adherence with his medications since discharge.   Fortunately, all of his medications are on our $5 list at [facility] which will hopefully be less cost prohibitive and allow for increased adherence. He has been following a low salt diet at home. I did counsel him on moderate intensity exercise for >150 minutes each week and he notes walking 2-4 miles a couple times a week. He does seem committed to improving his health. Given BP above goal, will increase lisinopril to 40mg  daily. Will check BMP today. I have asked him to keep a home BP log and bring to next visit with me in 3 weeks.  Plan: -continue coreg, lasix, hydral, imdur  -increased lisinopril to 40mg  daily -f/u BMP -keep home BP log and bring to next visit in 3 weeks

## 2021-08-18 NOTE — Patient Instructions (Signed)
Joseph Ray,  It was a pleasure seeing you in the clinic today.   I have increased your dose of lisinopril (blood pressure medicine). Please pick up the new prescription from [facility]. Please fill out the [facility] card paperwork for financial assistance. You are scheduled to see the heart doctor on 09/01/2021 with Dr. Wyline Mood. Please keep a blood pressure log at home as we discussed and bring it to your next visit with me in 3 weeks so we can make sure you are doing well and your diarrhea is improving.  Please call our clinic at [phone] if you have any questions or concerns. The best time to call is Monday-Friday from 9am-4pm, but there is someone available 24/7 at the same number. If you need medication refills, please notify your pharmacy one week in advance and they will send [place] a request.   Thank you for letting us take part in your care. We look forward to seeing you next time!

## 2021-08-18 NOTE — Assessment & Plan Note (Signed)
A1c of 6.0% noted during recent hospitalization, placing him in the prediabetic range. Counseled on lifestyle modifications (as outlined in HTN problem). Repeat A1c in 6 months.  Plan: -lifestyle modifications -repeat A1c in 6 months

## 2021-08-18 NOTE — Assessment & Plan Note (Signed)
Patient recently hospitalized for NSTEMI and hypertensive emergency in the setting of medication nonadherence. LHC on 08/16/2021 showing persistent thrombus in the left circumflex that was not amenable to PCI. Cardiology evaluated and advised medical management with DAPT and aggressive BP control. He endorses strict adherence with his medication regimen. He is scheduled to follow up with Dr. Wyline Mood, cardiology, on 09/01/2021.   Plan: -continue DAPT -BP goal <130/80 -f/u with cardiology -f/u in 3 weeks

## 2021-08-18 NOTE — Assessment & Plan Note (Addendum)
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 ECHO during recent hospitalization (08/24/2021) showing LVEF 30-35%, moderately decreased LV systolic function, global hypokinesis, moderate LVH, G2DD, and mild-moderate MVR.   He was previously on Entresto along with other GDMT but this was stopped in February 2023 after patient lost job and insurance given prohibitive cost. After recent hospitalization for NSTEMI and hypertensive emergency, he was continued on the following GDMT: lisinopril 20mg  daily, coreg 6.25mg  BID,  lasix 40mg  daily. His mother is a and has provided him with a pill bottle to organize his medications. He does report strict adherence to his medications since hospital discharge. He is currently euvolemic on my exam with minimal peripheral edema in lower extremities.  He is scheduled to see Dr. , cardiology, on 09/01/2021 for follow up. Plan to increase lisinopril today for better BP control.  Plan: -continue current GDMT (with increased lisinopril dose) -f/u with cardiology -f/u in 3 weeks

## 2021-08-18 NOTE — Progress Notes (Signed)
CC: hospital follow up of HTN, NSTEMI, CHF and new to establish PCP  HPI:  [name] is a 46 y.o. male with history listed below presenting to the [facility] for hospital follow up for HTN, NSTEMI, CHF and new to establish PCP. Please see individualized problem based charting for full HPI.  Past Medical History:  Diagnosis Date   Achilles tendon tear    left   Combined congestive systolic and diastolic heart failure (HCC)    Hypertension    Need for assessment for sleep apnea    NSTEMI (non-ST elevated myocardial infarction) (HCC)    Prediabetes    Current Outpatient Medications on File Prior to Visit  Medication Sig Dispense Refill   aspirin EC 81 MG tablet Take 1 tablet (81 mg total) by mouth daily. Swallow whole. 30 tablet 12   atorvastatin (LIPITOR) 80 MG tablet Take 1 tablet (80 mg total) by mouth daily. 30 tablet 2   carvedilol (COREG) 6.25 MG tablet Take 1 tablet (6.25 mg total) by mouth 2 (two) times daily with a meal. 60 tablet 2   clopidogrel (PLAVIX) 75 MG tablet Take 1 tablet (75 mg total) by mouth daily. 30 tablet 2   furosemide (LASIX) 40 MG tablet Take 1 tablet (40 mg total) by mouth daily. 30 tablet 0   hydrALAZINE (APRESOLINE) 25 MG tablet Take 1 tablet (25 mg total) by mouth 3 (three) times daily. 90 tablet 2   isosorbide dinitrate (ISORDIL) 10 MG tablet Take 1 tablet (10 mg total) by mouth 3 (three) times daily. 90 tablet 2   No current facility-administered medications on file prior to visit.   Allergies - Penicillin (since he was a child, unsure what reaction)   Past Surgical History:  Procedure Laterality Date   ACHILLES TENDON SURGERY Left 03/25/2013   Procedure: LEFT ACHILLES TENDON REPAIR;  Surgeon: Joseph Lewandowsky, MD;  Location: [facility];  Service: Orthopedics;  Laterality: Left;   LEFT HEART CATH AND CORONARY ANGIOGRAPHY N/A 08/16/2021   Procedure: LEFT HEART CATH AND CORONARY ANGIOGRAPHY;  Surgeon: Joseph Crafts, MD;  Location:  [facility] INVASIVE CV LAB;  Service: Cardiovascular;  Laterality: N/A;   Family History  Problem Relation Age of Onset   Hypertension Mother    Heart disease Father    Hypertension Father    Hypertension Brother    Cancer Paternal Aunt    Hypertension Paternal Grandfather    Heart disease Paternal Grandfather    Social History   Socioeconomic History   Marital status: Single    Spouse name: Not on file   Number of children: 3   Years of education: Not on file   Highest education level: Not on file  Occupational History   Not on file  Tobacco Use   Smoking status: Some Days    Packs/day: 0.15    Years: 15.00    Pack years: 2.25    Types: Cigarettes   Smokeless tobacco: Never  Vaping Use   Vaping Use: Never used  Substance and Sexual Activity   Alcohol use: Yes    Comment: social   Drug use: No   Sexual activity: Not on file  Other Topics Concern   Not on file  Social History Narrative   Not on file   Social Determinants of Health   Financial Resource Strain: Not on file  Food Insecurity: Not on file  Transportation Needs: Not on file  Physical Activity: Not on file  Stress: Not on  file  Social Connections: Not on file  Intimate Partner Violence: Not on file    Review of Systems:  Negative aside from that listed in individualized problem based charting.  Physical Exam:  Vitals:   08/18/21 0948 08/18/21 0955  BP: (!) 141/87 (!) 136/92  Pulse: 90 84  Temp: 98.3 F (36.8 C)   TempSrc: Oral   SpO2: 100%   Weight: 292 lb 11.2 oz (132.8 kg)   Height: 6\' 3"  (1.905 m)    Physical Exam Constitutional:      Appearance: He is obese. He is not ill-appearing.  HENT:     Head: Normocephalic and atraumatic.     Nose: Nose normal. No congestion.     Mouth/Throat:     Mouth: Mucous membranes are moist.     Pharynx: Oropharynx is clear. No oropharyngeal exudate.  Eyes:     Extraocular Movements: Extraocular movements intact.     Conjunctiva/sclera: Conjunctivae  normal.     Pupils: Pupils are equal, round, and reactive to light.  Cardiovascular:     Rate and Rhythm: Normal rate and regular rhythm.     Pulses: Normal pulses.     Heart sounds: Normal heart sounds. No murmur heard.   No friction rub.  Pulmonary:     Effort: Pulmonary effort is normal.     Breath sounds: Normal breath sounds. No wheezing, rhonchi or rales.  Abdominal:     General: Bowel sounds are normal. There is no distension.     Palpations: Abdomen is soft.     Tenderness: There is no abdominal tenderness.  Musculoskeletal:        General: Normal range of motion.     Cervical back: Normal range of motion.     Comments: Trace peripheral edema bilaterally  Skin:    General: Skin is warm and dry.  Neurological:     General: No focal deficit present.     Mental Status: He is alert and oriented to person, place, and time.  Psychiatric:        Mood and Affect: Mood normal.        Behavior: Behavior normal.        Thought Content: Thought content normal.        Judgment: Judgment normal.     Assessment & Plan:   See Encounters Tab for problem based charting.  Patient discussed with Dr. 

## 2021-08-18 NOTE — Assessment & Plan Note (Signed)
Patient reports that he thinks he has sleep apnea and was previously supposed to get a sleep study performed but unfortunately lost his insurance and was unable to do so. STOPBANG score of 7, indicating high risk for apnea. Unfortunately, patient is currently uninsured and thus sleep study/CPAP may be cost prohibitive. I strongly suspect that patient has OSA and that this is impacting his cardiac history.  He is planning to find a new job and hopes to obtain adequate insurance coverage through it, which I highly recommended. Also counseled patient on exercise to promote weight loss.  Plan: -sleep study once he obtains insurance -will likely need CPAP in the future -diet and exercise

## 2021-08-19 ENCOUNTER — Other Ambulatory Visit (HOSPITAL_COMMUNITY): Payer: Self-pay

## 2021-08-19 LAB — BMP8+ANION GAP
Anion Gap: 20 mmol/L — ABNORMAL HIGH (ref 10.0–18.0)
BUN/Creatinine Ratio: 14 (ref 9–20)
BUN: 18 mg/dL (ref 6–24)
CO2: 17 mmol/L — ABNORMAL LOW (ref 20–29)
Calcium: 9.4 mg/dL (ref 8.7–10.2)
Chloride: 100 mmol/L (ref 96–106)
Creatinine, Ser: 1.26 mg/dL (ref 0.76–1.27)
Glucose: 92 mg/dL (ref 70–99)
Potassium: 4 mmol/L (ref 3.5–5.2)
Sodium: 137 mmol/L (ref 134–144)
eGFR: 72 mL/min/{1.73_m2} (ref >59)

## 2021-08-19 NOTE — Progress Notes (Signed)
Internal Medicine Clinic Attending  Case discussed with Dr. Jinwala  At the time of the visit.  We reviewed the resident's history and exam and pertinent patient test results.  I agree with the assessment, diagnosis, and plan of care documented in the resident's note.  

## 2021-08-20 ENCOUNTER — Other Ambulatory Visit (HOSPITAL_COMMUNITY): Payer: Self-pay

## 2021-08-24 ENCOUNTER — Telehealth (HOSPITAL_COMMUNITY): Payer: Self-pay

## 2021-08-24 NOTE — Telephone Encounter (Signed)
Called patient to see if he is interested in the Cardiac Rehab Program. Patient expressed interest. Explained scheduling process and went over insurance. Will contact patient for scheduling once f/u has been completed.

## 2021-08-26 ENCOUNTER — Encounter (HOSPITAL_BASED_OUTPATIENT_CLINIC_OR_DEPARTMENT_OTHER): Payer: Self-pay

## 2021-08-26 NOTE — Telephone Encounter (Signed)
RN called patient to confirm his blood pressure, he says that the machine says 167/125. He states this is inaccurate and his machine is pretty messed up.   He states that for about 2 days ago when he gets up he gets dizzy. He states when he starts moving it gets better.   Encouraged patient to ensure more fluids and changes positions slowly likely experincing orthostatic hypotension.   Patient states that he is taking Carvedilol twice daily, and his lisinopril was increased to 40mg . Reviewed the rest of medication list with patient and he does endorse being somewhat confused about all his blood pressure medications.    As patient has upcoming appointment with Dr. will route to her team.

## 2021-09-01 ENCOUNTER — Encounter: Payer: Self-pay | Admitting: Internal Medicine

## 2021-09-01 ENCOUNTER — Other Ambulatory Visit (HOSPITAL_COMMUNITY): Payer: Self-pay

## 2021-09-01 ENCOUNTER — Telehealth: Payer: Self-pay | Admitting: Licensed Clinical Social Worker

## 2021-09-01 ENCOUNTER — Ambulatory Visit (INDEPENDENT_AMBULATORY_CARE_PROVIDER_SITE_OTHER): Payer: Self-pay | Admitting: Internal Medicine

## 2021-09-01 VITALS — BP 152/111 | HR 90 | Ht 75.0 in | Wt 293.2 lb

## 2021-09-01 DIAGNOSIS — Z79899 Other long term (current) drug therapy: Secondary | ICD-10-CM

## 2021-09-01 DIAGNOSIS — I5021 Acute systolic (congestive) heart failure: Secondary | ICD-10-CM

## 2021-09-01 MED ORDER — ENTRESTO 24-26 MG PO TABS
1.0000 | ORAL_TABLET | Freq: Two times a day (BID) | ORAL | 3 refills | Status: DC
  Filled 2021-09-01 – 2021-09-28 (×3): qty 60, 30d supply, fill #0
  Filled 2021-12-04 – 2022-03-31 (×8): qty 60, 30d supply, fill #1

## 2021-09-01 MED ORDER — NITROGLYCERIN 0.4 MG SL SUBL
0.4000 mg | SUBLINGUAL_TABLET | SUBLINGUAL | 3 refills | Status: DC | PRN
  Filled 2021-09-01 – 2021-09-29 (×2): qty 25, 30d supply, fill #0

## 2021-09-01 MED ORDER — DAPAGLIFLOZIN PROPANEDIOL 10 MG PO TABS
10.0000 mg | ORAL_TABLET | Freq: Every day | ORAL | 3 refills | Status: DC
  Filled 2021-09-01 – 2021-09-28 (×3): qty 30, 30d supply, fill #0
  Filled 2021-12-04 – 2022-03-31 (×7): qty 30, 30d supply, fill #1

## 2021-09-01 NOTE — Telephone Encounter (Signed)
LCSW received referral for care navigation/SDOH screening.  Pt uninsured, having difficulties with medications and sleep study.   LCSW attempted to reach pt this morning at [phone]. No answer, voicemail left requesting call back- I will re-attempt as able.   Octavio Graves, MSW, LCSW Clinical Social Worker II [facility] Navigation  [phone]- work cell phone (preferred) [phone]- desk phone

## 2021-09-01 NOTE — Progress Notes (Signed)
Cardiology Office Note:    Date:  09/01/2021   ID:  Joseph Ray, DOB May 13, 1975, MRN [id number]  PCP:  Merrilyn Puma, MD   [facility] Providers Cardiologist:  Jodelle Red, MD     Referring MD: [facility]*   No chief complaint on file. Hospital Follow up  History of Present Illness:    Joseph Ray is a 46 y.o. male with a hx of chronic ?non ischemic systolic heart failure in the setting of poorly controlled HTN (job changes and could not continue his medications), hypertension, and DM who presented in early June with an NSTEMI, managed medically 2/2 complicated lesion who is here for hospital follow up  Joseph Ray presented with hypertensive emergency and chest pressure. He was noted to have an NSTEMI. His blood pressures were controlled and he went for [facility]. On his cath, he had large thrombus burden, the vessel was tortuous and risk was greater than benefit.  His EF was 30-35% with grade II DD. He was discharged on GDMT.   Today, he states he walks everyday up to 35 minutes per day. He no longer has chest pressure. He denies orthopnea, PND or LE edema.  At home his Bps are well controlled. He did not take his medication this AM. He also notes that he does not have his CPAP despite diagnosis of sleep apnea.  Cardiology Studies:  Cardiac Cath 08/16/2021    Dist RCA lesion is 50% stenosed, after more proximal aneurysm.   Mid Cx lesion is 99% stenosed.  Heavy thrombus and an aneurysmal segment.  Culprit, but not a candudate for PCI.   LV end diastolic pressure is mildly elevated.   There is no aortic valve stenosis.   Severe, right subclavian tortuosity which made torquing catheters somewhat difficult.  The left system was straightforward to engage, but the RCA was more difficult, but ultimately successful.  Could consider destination sheath if using the right radial approach in the future, but this was not needed today.   Aneurysmal segments in the LAD,  proximal to mid RCA and mid circumflex after large obtuse marginal.  The mid circumflex appears to be the culprit.  This large aneurysmal segment appears to be filled with thrombus.  There is some flow through the circumflex to fill the distal vessel.  There severe retroflexion of the circumflex as well.  I do not think intervention would be possible given the angulation and the large burden of thrombus.  He has not had pain in 2 days.  Would treat with dual antiplatelet therapy and aggressive secondary prevention, along with medical therapy for his heart failure.  No other severe disease noted.   Consider indefinite dual antiplatelet therapy.   TTE 08/14/2021 EF 30-35, normal RV, functional mild-moderate MR, aortic aneurysm 4.3 cm  TTE 03/12/2020 EF 30-35%  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 08/13/2021   Need for assessment for sleep apnea    NSTEMI (non-ST elevated myocardial infarction) [facility])    Prediabetes     Past Surgical History:  Procedure Laterality Date   ACHILLES TENDON SURGERY Left 03/25/2013   Procedure: LEFT ACHILLES TENDON REPAIR;  Surgeon: [name], MD;  Location: [facility];  Service: Orthopedics;  Laterality: Left;  LEFT HEART CATH AND CORONARY ANGIOGRAPHY N/A 08/16/2021   Procedure: LEFT HEART CATH AND CORONARY ANGIOGRAPHY;  Surgeon: [name], MD;  Location: [facility] INVASIVE CV LAB;  Service: Cardiovascular;  Laterality: N/A;    Current Medications: Current Meds  Medication Sig   aspirin EC 81 MG tablet Take 1 tablet (81 mg total) by mouth daily. Swallow whole.   atorvastatin (LIPITOR) 80 MG tablet Take 1 tablet (80 mg total) by mouth daily.   carvedilol (COREG) 6.25 MG tablet Take 1 tablet (6.25 mg total) by mouth 2  (two) times daily with a meal.   clopidogrel (PLAVIX) 75 MG tablet Take 1 tablet (75 mg total) by mouth daily.   dapagliflozin propanediol (FARXIGA) 10 MG TABS tablet Take 1 tablet (10 mg total) by mouth daily before breakfast.   furosemide (LASIX) 40 MG tablet Take 1 tablet (40 mg total) by mouth daily.   hydrALAZINE (APRESOLINE) 25 MG tablet Take 1 tablet (25 mg total) by mouth 3 (three) times daily.   isosorbide dinitrate (ISORDIL) 10 MG tablet Take 1 tablet (10 mg total) by mouth 3 (three) times daily.   nitroGLYCERIN (NITROSTAT) 0.4 MG SL tablet Place 1 tablet (0.4 mg total) under the tongue every 5 (five) minutes as needed.   sacubitril-valsartan (ENTRESTO) 24-26 MG Take 1 tablet by mouth 2 (two) times daily.   [DISCONTINUED] lisinopril (ZESTRIL) 40 MG tablet Take 1 tablet (40 mg total) by mouth daily.     Allergies:   Penicillins   Social History   Socioeconomic History   Marital status: Single    Spouse name: Not on file   Number of children: 3   Years of education: Not on file   Highest education level: Not on file  Occupational History   Not on file  Tobacco Use   Smoking status: Some Days    Packs/day: 0.15    Years: 15.00    Total pack years: 2.25    Types: Cigarettes   Smokeless tobacco: Never  Vaping Use   Vaping Use: Never used  Substance and Sexual Activity   Alcohol use: Yes    Comment: social   Drug use: No   Sexual activity: Not on file  Other Topics Concern   Not on file  Social History Narrative   Not on file   Social Determinants of Health   Financial Resource Strain: Not on file  Food Insecurity: Not on file  Transportation Needs: Not on file  Physical Activity: Not on file  Stress: Not on file  Social Connections: Not on file     Family History: The patient's family history includes Cancer in his paternal aunt; Heart disease in his father and paternal grandfather; Hypertension in his brother, father, mother, and paternal grandfather. Father  had MI at 22, hx of CABG  ROS:   Please see the history of present illness.     All other systems reviewed and are negative.  EKGs/Labs/Other Studies Reviewed:    The following studies were reviewed today:   EKG:  EKG is  ordered today.  The ekg ordered today demonstrates   09/01/2021-NSR, LVH with repol  Recent Labs: 08/13/2021: ALT 23; B Natriuretic Peptide 131.6 08/16/2021: Hemoglobin 14.1; Platelets 226 08/18/2021: BUN 18; Creatinine, Ser 1.26; Potassium 4.0; Sodium 137   Recent Lipid Panel    Component Value Date/Time   CHOL 193 03/12/2020 0455   TRIG 152 (H) 03/12/2020 0455   HDL 26 (L) 03/12/2020 0455   CHOLHDL 7.4 03/12/2020 0455  VLDL 30 03/12/2020 0455   LDLCALC 137 (H) 03/12/2020 0455     Risk Assessment/Calculations:           Physical Exam:    VS:  Vitals:   09/01/21 0905  BP: (!) 152/111  Pulse: 90  SpO2: 96%     Wt Readings from Last 3 Encounters:  09/01/21 293 lb 3.2 oz (133 kg)  08/18/21 292 lb 11.2 oz (132.8 kg)  08/15/21 299 lb 13.2 oz (136 kg)     GEN:  Well nourished, well developed in no acute distress HEENT: Normal NECK: No JVD; No carotid bruits LYMPHATICS: No lymphadenopathy CARDIAC: RRR, no murmurs, rubs, gallops RESPIRATORY:  Clear to auscultation without rales, wheezing or rhonchi  ABDOMEN: Soft, non-tender, non-distended MUSCULOSKELETAL:  No edema; No deformity  SKIN: Warm and dry NEUROLOGIC:  Alert and oriented x 3 PSYCHIATRIC:  Normal affect   ASSESSMENT:    #Systolic Heart Failure:  NYHA Class I EF 30-35% noted systolic heart failure since 2021, thought non ischemic. Related to poorly controlled HTN in the setting of inconsistent medication related to job loss. Will work with social work and pharmacy for medication adherence.  - stop lisinopril and start entresto 24/26 mmg BID ( he was not taking lisinopril, he stated he did not know to take all together; also reports some LH in the AM with medications, we discussed  staggering the doses) - continue coreg 6.25 mg BID - continue imdur 10/hydralazine 25 mg TID - start SGLT2 - continue lasix 40 mg daily - if NYHA class progresses or EF 30 or less will send to EP for primary prevention ICD  #Ischemic Heart Disease: has known ischemic disease. His left circulation lesion was 99% in the mid segment c/f thrombus. Intervening would have been more risk then benefit. Plan is to continue DAPT until June 2024 - continue aspirin 81 mg daily - continue plavix 75 mg daily - continue lipitor 80 mg daily - nitro SL PRN  #HTN: per above. Was not taking medications appropriately. He has a BP cuff at home. Recommend continued monitoring. Agree that OSA is contributing to his HTN and heart failure which has been explained to him by his PCP.   #OSA: he was diagnosed with sleep apnea; but was unable to get a CPAP 2/2 the fact that it is cost prohibitive as he is in between jobs.  #Mild-moderate MR: functional. Plan for medication management.  #Ascending Aortic Aneurysm: will plan yearly assessment and if reaches 4.5 will plan for CT to confirm.  PLAN:    In order of problems listed above:  Stop lisinopril and start entresto 24/26 mg BID BMET 2 weeks Start farxiga 10 mg daily Start nitro PRN Social work referral (help with orange card) Pharmacy visit in 3 months Follow up in 6 months    Cardiac Rehabilitation Eligibility Assessment          Medication Adjustments/Labs and Tests Ordered: Current medicines are reviewed at length with the patient today.  Concerns regarding medicines are outlined above.  Orders Placed This Encounter  Procedures   Basic metabolic panel   Referral to HRT/VAS Care Navigation   Meds ordered this encounter  Medications   sacubitril-valsartan (ENTRESTO) 24-26 MG    Sig: Take 1 tablet by mouth 2 (two) times daily.    Dispense:  60 tablet    Refill:  3   dapagliflozin propanediol (FARXIGA) 10 MG TABS tablet    Sig: Take 1 tablet  (10 mg total) by  mouth daily before breakfast.    Dispense:  30 tablet    Refill:  3   nitroGLYCERIN (NITROSTAT) 0.4 MG SL tablet    Sig: Place 1 tablet (0.4 mg total) under the tongue every 5 (five) minutes as needed.    Dispense:  25 tablet    Refill:  3    Patient Instructions  Please take these medications as prescribed below; as we discussed this is important for your heart pumping function. We are sending a referral to social work to help with being able to continue with your medicines. Please talk to your primary care doctor about how to obtain a CPAP machine.    Medication Instructions:  START Entresto 24/26 mg two times daily START Farxiga 10 mg daily  Take sublingual nitroglycerin as needed  *If you need a refill on your cardiac medications before your next appointment, please call your pharmacy*   Lab Work: Please return for labs in 2 weeks (BMET)  Our in office lab hours are Monday-Friday 8:00-4:00, closed for lunch 12:45-1:45 pm.  No appointment needed.  [facility] locations:   [facility] - [address]  - [address] ([facility]) - [address] [phone] [address]   [place] - [address]    [place]  - [address]    [place] - [address] - [address] [facility]  - [address] [place] - [address] ([facility])  Follow-Up: At [facility], you and your health needs are our priority.  As part of our continuing mission to provide you with exceptional heart care, we have created designated Provider Care Teams.  These Care Teams include your primary Cardiologist (physician) and Advanced Practice Providers (APPs -  Physician Assistants and Nurse Practitioners) who all work together to provide you with the care you need, when you need it.  We recommend signing up for the patient portal called "MyChart".  Sign  up information is provided on this After Visit Summary.  MyChart is used to connect with patients for Virtual Visits (Telemedicine).  Patients are able to view lab/test results, encounter notes, upcoming appointments, etc.  Non-urgent messages can be sent to your provider as well.   To learn more about what you can do with MyChart, go to [email or web address].    Your next appointment:   pharmD in 3 months Dr. Wyline Mood in 6 months     Important Information About Sugar         Signed, Maisie Fus, MD  09/01/2021 10:27 AM    [facility]

## 2021-09-01 NOTE — Telephone Encounter (Signed)
Patient seen today by Dr. Wyline Mood in office.

## 2021-09-01 NOTE — Patient Instructions (Addendum)
Please take these medications as prescribed below; as we discussed this is important for your heart pumping function. We are sending a referral to social work to help with being able to continue with your medicines. Please talk to your primary care doctor about how to obtain a CPAP machine.    Medication Instructions:  START Entresto 24/26 mg two times daily START Farxiga 10 mg daily  Take sublingual nitroglycerin as needed  *If you need a refill on your cardiac medications before your next appointment, please call your pharmacy*   Lab Work: Please return for labs in 2 weeks (BMET)  Our in office lab hours are Monday-Friday 8:00-4:00, closed for lunch 12:45-1:45 pm.  No appointment needed.  [facility] locations:   [facility] - [address]  - [address] ([facility]) - [address] [phone] [address]   [place] - [address]    [place]  - [address]    [place] - [address] - [address] [employer]  - [address] [place] - [address] ([facility])  Follow-Up: At [facility], you and your health needs are our priority.  As part of our continuing mission to provide you with exceptional heart care, we have created designated Provider Care Teams.  These Care Teams include your primary Cardiologist (physician) and Advanced Practice Providers (APPs -  Physician Assistants and Nurse Practitioners) who all work together to provide you with the care you need, when you need it.  We recommend signing up for the patient portal called "MyChart".  Sign up information is provided on this After Visit Summary.  MyChart is used to connect with patients for Virtual Visits (Telemedicine).  Patients are able to view lab/test results, encounter notes, upcoming appointments, etc.  Non-urgent messages can be sent to your provider as well.   To learn  more about what you can do with MyChart, go to [email or web address].    Your next appointment:   pharmD in 3 months Dr. Wyline Mood in 6 months     Important Information About Sugar

## 2021-09-03 ENCOUNTER — Telehealth: Payer: Self-pay | Admitting: Licensed Clinical Social Worker

## 2021-09-03 NOTE — Telephone Encounter (Signed)
LCSW received referral for care navigation/SDOH screening.  Pt uninsured, having difficulties with medications and sleep study.    LCSW attempted to reach pt this afternoon at [phone].  No answer and rang straight to voicemail, 2nd voicemail left requesting call back- I will re-attempt as able.    Octavio Graves, MSW, LCSW Clinical Social Worker II [facility] Navigation  [phone]- work cell phone (preferred) [phone]- desk phone

## 2021-09-06 ENCOUNTER — Telehealth: Payer: Self-pay | Admitting: Licensed Clinical Social Worker

## 2021-09-09 ENCOUNTER — Other Ambulatory Visit (HOSPITAL_COMMUNITY): Payer: Self-pay

## 2021-09-24 ENCOUNTER — Other Ambulatory Visit (HOSPITAL_COMMUNITY): Payer: Self-pay

## 2021-09-25 ENCOUNTER — Other Ambulatory Visit (HOSPITAL_COMMUNITY): Payer: Self-pay

## 2021-09-27 ENCOUNTER — Other Ambulatory Visit (HOSPITAL_COMMUNITY): Payer: Self-pay

## 2021-09-28 ENCOUNTER — Other Ambulatory Visit: Payer: Self-pay

## 2021-09-28 ENCOUNTER — Other Ambulatory Visit (HOSPITAL_COMMUNITY): Payer: Self-pay

## 2021-09-29 ENCOUNTER — Other Ambulatory Visit: Payer: Self-pay

## 2021-09-29 ENCOUNTER — Other Ambulatory Visit: Payer: Self-pay | Admitting: Student

## 2021-09-30 ENCOUNTER — Other Ambulatory Visit: Payer: Self-pay

## 2021-09-30 MED ORDER — FUROSEMIDE 40 MG PO TABS
40.0000 mg | ORAL_TABLET | Freq: Every day | ORAL | 0 refills | Status: DC
  Filled 2021-09-30: qty 30, 30d supply, fill #0

## 2021-10-11 ENCOUNTER — Other Ambulatory Visit: Payer: Self-pay

## 2021-10-11 ENCOUNTER — Other Ambulatory Visit (HOSPITAL_COMMUNITY): Payer: Self-pay

## 2021-10-12 ENCOUNTER — Encounter (HOSPITAL_BASED_OUTPATIENT_CLINIC_OR_DEPARTMENT_OTHER): Payer: Self-pay

## 2021-10-13 ENCOUNTER — Encounter (HOSPITAL_BASED_OUTPATIENT_CLINIC_OR_DEPARTMENT_OTHER): Payer: Self-pay

## 2021-10-13 ENCOUNTER — Encounter: Payer: Self-pay | Admitting: Internal Medicine

## 2021-10-13 NOTE — Telephone Encounter (Signed)
Please advise 

## 2021-10-18 ENCOUNTER — Other Ambulatory Visit: Payer: Self-pay

## 2021-10-20 ENCOUNTER — Ambulatory Visit (INDEPENDENT_AMBULATORY_CARE_PROVIDER_SITE_OTHER): Payer: 59 | Admitting: Internal Medicine

## 2021-10-20 ENCOUNTER — Encounter: Payer: Self-pay | Admitting: Student

## 2021-10-20 DIAGNOSIS — I5042 Chronic combined systolic (congestive) and diastolic (congestive) heart failure: Secondary | ICD-10-CM | POA: Diagnosis not present

## 2021-10-20 DIAGNOSIS — F17210 Nicotine dependence, cigarettes, uncomplicated: Secondary | ICD-10-CM | POA: Diagnosis not present

## 2021-10-20 NOTE — Progress Notes (Signed)
CC: fitness assessment  HPI:  Joseph Ray is a 46 y.o. male living with a history stated below and presents today for a job fitness assessment. Please see problem based assessment and plan for additional details.  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 Swallow whole. 30 tablet 12   atorvastatin (LIPITOR) 80 MG tablet Take 1 tablet (80 mg total) by mouth daily. 30 tablet 2   carvedilol (COREG) 6.25 MG tablet Take 1 tablet (6.25 mg total) by mouth 2 (two) times daily with a meal. 60 tablet 2   clopidogrel (PLAVIX) 75 MG tablet Take 1 tablet (75 mg total) by mouth daily. 30 tablet 2   dapagliflozin propanediol (FARXIGA) 10 MG TABS tablet Take 1 tablet (10 mg total) by mouth daily before breakfast. 30 tablet 3   furosemide (LASIX) 40 MG tablet Take 1 tablet (40 mg total) by mouth daily. 30 tablet 0   hydrALAZINE (APRESOLINE) 25 MG tablet Take 1 tablet (25 mg total) by mouth 3 (three) times daily. 90 tablet 2   isosorbide dinitrate (ISORDIL) 10 MG tablet Take 1 tablet (10 mg total) by mouth 3 (three) times daily. 90 tablet 2   nitroGLYCERIN (NITROSTAT) 0.4 MG SL tablet Place 1 tablet (0.4 mg total) under the tongue every 5 (five) minutes as needed. 25 tablet 3   sacubitril-valsartan (ENTRESTO) 24-26 MG Take 1 tablet by mouth 2 (two) times daily. 60 tablet 3   No current facility-administered medications on file prior to visit.    Family History  Problem Relation Age of Onset    Hypertension Mother    Heart disease Father    Hypertension Father    Hypertension Brother    Cancer Paternal Aunt    Hypertension Paternal Grandfather    Heart disease Paternal Grandfather     Social History   Socioeconomic History   Marital status: Single    Spouse name: Not on file   Number of children: 3   Years of education: Not on file   Highest education level: Not on file  Occupational History   Not on file  Tobacco Use   Smoking status: Some Days    Packs/day: 0.15    Years: 15.00    Total pack years: 2.25    Types: Cigarettes   Smokeless tobacco: Never  Vaping Use   Vaping Use: Never used  Substance and Sexual Activity   Alcohol use: Yes    Comment: social   Drug use: No   Sexual activity: Not on file  Other Topics Concern   Not on file  Social History Narrative   Not on file   Social Determinants of Health   Financial Resource Strain: Not on file  Food Insecurity: Not on file  Transportation Needs: Not on file  Physical Activity: Not on file  Stress: Not on file  Social Connections: Not on file  Intimate Partner Violence: Not on file  Review of Systems: ROS negative except for what is noted on the assessment and plan.  Vitals:   10/20/21 1422  BP: 123/72  Pulse: 95  SpO2: 99%  Weight: (!) 300 lb 3.2 oz (136.2 kg)  Height: 6\' 3"  (1.905 m)    Physical Exam: Constitutional: well-appearing male sitting in chair, in no acute distress Cardiovascular: regular rate and rhythm, no m/r/g Pulmonary/Chest: normal work of breathing on room air, lungs clear to auscultation bilaterally MSK: normal bulk and tone Neurological: alert & oriented x 3, no focal deficit Skin: warm and dry Psych: normal mood and behavior  Assessment & Plan:     Patient discussed with Dr.  Combined systolic and diastolic congestive heart failure [facility]) The patient presents to the [facility] for a job fitness assessment. He had an NSTEMI in June 2023 and was also  diagnosed with CHF. His EF in June 2023 was noted to be 30-35% with grade II diastolic dysfunction. Also noted to have global hypokinesis and moderate LVH at that time. He has been following with cardiology and is taking coreg 6.25 mg bid, hydralazine 25 mg tid, isosorbide 10 mg tid, lasix 40 mg daily, and entresto 50 mg bid. The patient recently started a job at [employer] and it is quite physical and demanding. 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 He needs a fitness assessment to document any work restrictions.   Paperwork completed per patient request and reflects the modifications/restrictions he needs in order to be safe at his job. I recommended that the patient stands/walks for no more than 4 hours per day, as opposed to 8, as the patient is likely to get fatigued/short of breath with that degree of exertion. I also strongly recommend he lifts no more than 60 lbs without assistance and that he should not be exposed to extreme temperatures, as to avoid dehydration/dizziness while on his medications. Patient is in agreement with the proposed modifications.    [place], D.O. [facility] Internal Medicine, PGY-2 Phone: [phone] Date 10/20/2021 Time 3:18 PM

## 2021-10-20 NOTE — Assessment & Plan Note (Addendum)
The patient presents to the [facility] for a job fitness assessment. He had an NSTEMI in June 2023 and was also diagnosed with CHF. His EF in June 2023 was noted to be 30-35% with grade II diastolic dysfunction. Also noted to have global hypokinesis and moderate LVH at that time. He has been following with cardiology and is taking coreg 6.25 mg bid, hydralazine 25 mg tid, isosorbide 10 mg tid, lasix 40 mg daily, and entresto 50 mg bid. The patient recently started a job at [employer] and it is quite physical and demanding. 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 He needs a fitness assessment to document any work restrictions.   Paperwork completed per patient request and reflects the modifications/restrictions he needs in order to be safe at his job. I recommended that the patient stands/walks for no more than 4 hours per day, as opposed to 8, as the patient is likely to get fatigued/short of breath with that degree of exertion. I also strongly recommend he lifts no more than 60 lbs without assistance and that he should not be exposed to extreme temperatures, as to avoid dehydration/dizziness while on his medications. Patient is in agreement with the proposed modifications.

## 2021-10-20 NOTE — Telephone Encounter (Signed)
This patient was most recently seen by Dr. Wyline Mood in June and has follow up with her. I would defer this decision to her. Thanks!

## 2021-10-20 NOTE — Patient Instructions (Signed)
Thank you, Joseph Ray for allowing [place] to provide your care today. Today we discussed:  Heart failure and job restrictions: I will fill out your work paperwork to reflect the restrictions that we discussed. We will fax this over to [place] once this is done. Please continue to follow up with your cardiologist  I have ordered the following labs for you:  Lab Orders  No laboratory test(s) ordered today       Referrals ordered today:   Referral Orders  No referral(s) requested today     I have ordered the following medication/changed the following medications:   Stop the following medications: There are no discontinued medications.   Start the following medications: No orders of the defined types were placed in this encounter.    Follow up: 3-4 months     Should you have any questions or concerns please call the internal medicine clinic at [phone].     Elza Rafter, D.O. [facility]

## 2021-10-21 ENCOUNTER — Telehealth: Payer: Self-pay | Admitting: Cardiology

## 2021-10-21 NOTE — Telephone Encounter (Signed)
Mychart message sent to patient, needs to reach out to primary care per 8/2 mychart message

## 2021-10-21 NOTE — Progress Notes (Signed)
Internal Medicine Clinic Attending ° °Case discussed with Dr. Atway  At the time of the visit.  We reviewed the resident’s history and exam and pertinent patient test results.  I agree with the assessment, diagnosis, and plan of care documented in the resident’s note.  °

## 2021-10-21 NOTE — Telephone Encounter (Signed)
  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  They called & said they haven't received it yet.

## 2021-10-21 NOTE — Telephone Encounter (Signed)
Please see telephone encounter. Per chart review patient had paperwork filled out by his PCP.

## 2021-12-02 ENCOUNTER — Ambulatory Visit: Payer: 59 | Attending: Cardiology

## 2021-12-02 NOTE — Progress Notes (Deleted)
Patient ID: Joseph Ray                 DOB: [birth year]                      MRN: [id number]      HPI: Joseph Ray is a 46 y.o. male referred by Dr. Marland Kitchen to pharmacy clinic for HF medication management. PMH is significant for ***. Most recent LVEF *** on ***.  Today she returns to pharmacy clinic for further medication titration. At last visit with MD ***. Symptomatically, she is feeling ***, *** dizziness, lightheadedness, and fatigue. *** chest pain or palpitations. Feels SOB when ***. Able to complete all ADLs. Activity level ***. She *** checks her weight at home (normal range *** - *** lbs). *** LEE, PND, or orthopnea. Appetite has been ***. She *** adheres to a low-salt diet.  Current CHF meds:  Previously tried:  BP goal:   Family History:   Social History:   Diet:   Exercise:   Home BP readings:   Wt Readings from Last 3 Encounters:  10/20/21 (!) 300 lb 3.2 oz (136.2 kg)  09/01/21 293 lb 3.2 oz (133 kg)  08/18/21 292 lb 11.2 oz (132.8 kg)   BP Readings from Last 3 Encounters:  10/20/21 123/72  09/01/21 (!) 152/111  08/18/21 (!) 136/92   Pulse Readings from Last 3 Encounters:  10/20/21 95  09/01/21 90  08/18/21 84    Renal function: CrCl cannot be calculated (Patient's most recent lab result is older than the maximum 21 days allowed.).  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 Swallow whole. 30 tablet 12   atorvastatin (LIPITOR) 80 MG tablet Take 1 tablet (80  mg total) by mouth daily. 30 tablet 2   carvedilol (COREG) 6.25 MG tablet Take 1 tablet (6.25 mg total) by mouth 2 (two) times daily with a meal. 60 tablet 2   clopidogrel (PLAVIX) 75 MG tablet Take 1 tablet (75 mg total) by mouth daily. 30 tablet 2   dapagliflozin propanediol (FARXIGA) 10 MG TABS tablet Take 1 tablet (10 mg total) by mouth daily before breakfast. 30 tablet 3   furosemide (LASIX) 40 MG tablet Take 1 tablet (40 mg total) by mouth daily. 30 tablet 0   hydrALAZINE (APRESOLINE) 25 MG tablet Take 1 tablet (25 mg total) by mouth 3 (three) times daily. 90 tablet 2   isosorbide dinitrate (ISORDIL) 10 MG tablet Take 1 tablet (10 mg total) by mouth 3 (three) times daily. 90 tablet 2   nitroGLYCERIN (NITROSTAT) 0.4 MG SL tablet Place 1 tablet (0.4 mg total) under the tongue every 5 (five) minutes as needed. 25 tablet 3   sacubitril-valsartan (ENTRESTO) 24-26 MG Take 1 tablet by mouth 2 (two) times daily. 60 tablet 3   No current facility-administered medications on file prior to visit.    Allergies  Allergen Reactions  Penicillins Rash    Has patient had a PCN reaction causing immediate rash, facial/tongue/throat swelling, SOB or lightheadedness with hypotension: {Yes/No:30480221}No Has patient had a PCN reaction causing severe rash involving mucus membranes or skin necrosis: {Yes/No:30480221}No Has patient had a PCN reaction that required hospitalization {Yes/No:30480221}No Has patient had a PCN reaction occurring within the last 10 years: {Yes/No:30480221}No If all of the above answers are "NO", then may proceed with Cephalosporin      Assessment/Plan:  1. CHF -

## 2021-12-04 ENCOUNTER — Other Ambulatory Visit: Payer: Self-pay | Admitting: Student

## 2021-12-06 ENCOUNTER — Other Ambulatory Visit: Payer: Self-pay

## 2021-12-07 ENCOUNTER — Other Ambulatory Visit: Payer: Self-pay

## 2021-12-07 MED ORDER — FUROSEMIDE 40 MG PO TABS
40.0000 mg | ORAL_TABLET | Freq: Every day | ORAL | 3 refills | Status: DC
  Filled 2021-12-07 – 2021-12-27 (×2): qty 30, 30d supply, fill #0
  Filled 2022-01-29 – 2022-02-10 (×2): qty 30, 30d supply, fill #1
  Filled 2022-02-12 – 2022-03-04 (×2): qty 30, 30d supply, fill #2
  Filled 2022-03-21 – 2022-04-04 (×3): qty 30, 30d supply, fill #3
  Filled ????-??-??: fill #3

## 2021-12-13 ENCOUNTER — Other Ambulatory Visit: Payer: Self-pay

## 2021-12-27 ENCOUNTER — Other Ambulatory Visit: Payer: Self-pay

## 2021-12-28 ENCOUNTER — Other Ambulatory Visit: Payer: Self-pay

## 2021-12-29 ENCOUNTER — Other Ambulatory Visit: Payer: Self-pay

## 2022-01-31 ENCOUNTER — Other Ambulatory Visit: Payer: Self-pay

## 2022-02-04 ENCOUNTER — Other Ambulatory Visit: Payer: Self-pay

## 2022-02-10 ENCOUNTER — Other Ambulatory Visit: Payer: Self-pay

## 2022-02-14 ENCOUNTER — Other Ambulatory Visit: Payer: Self-pay

## 2022-02-18 ENCOUNTER — Other Ambulatory Visit: Payer: Self-pay

## 2022-03-01 ENCOUNTER — Ambulatory Visit: Payer: 59 | Attending: Internal Medicine | Admitting: Internal Medicine

## 2022-03-04 ENCOUNTER — Other Ambulatory Visit: Payer: Self-pay

## 2022-03-08 ENCOUNTER — Other Ambulatory Visit (HOSPITAL_COMMUNITY): Payer: Self-pay

## 2022-03-08 ENCOUNTER — Other Ambulatory Visit: Payer: Self-pay

## 2022-03-09 ENCOUNTER — Other Ambulatory Visit: Payer: Self-pay

## 2022-03-10 ENCOUNTER — Encounter: Payer: Self-pay | Admitting: Internal Medicine

## 2022-03-21 ENCOUNTER — Other Ambulatory Visit: Payer: Self-pay

## 2022-03-21 IMAGING — CT CT ABD-PELV W/O CM
2 of 4 series · 15 of 46 positions shown, 17 images · non-contrast
Comparison: 08/06/2020

CLINICAL DATA: Worsening abdominal hernia with pain.

EXAM:
CT ABDOMEN AND PELVIS WITHOUT CONTRAST
TECHNIQUE: Multidetector CT imaging of the abdomen and pelvis was performed
following the standard protocol without IV contrast.

[Series 2: axial st · axial · 0.98mm/px · z∈[-562,-112]mm · 12 of 102 slices shown, 14 images]
[im 6/102  soft-tissue]
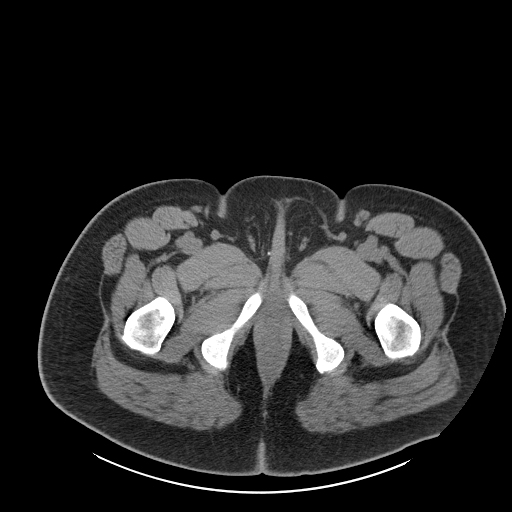
[im 6/102  bone]
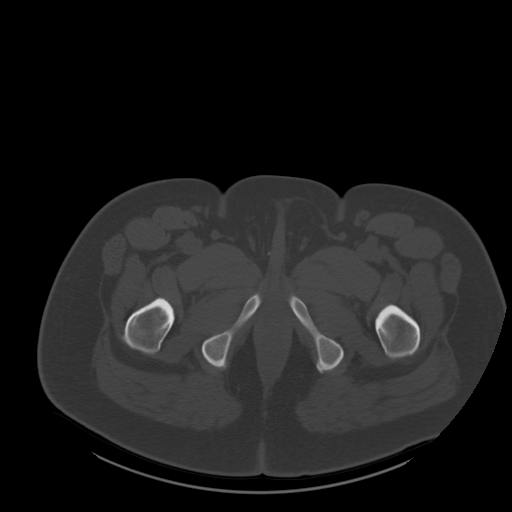
[im 18/102  soft-tissue]
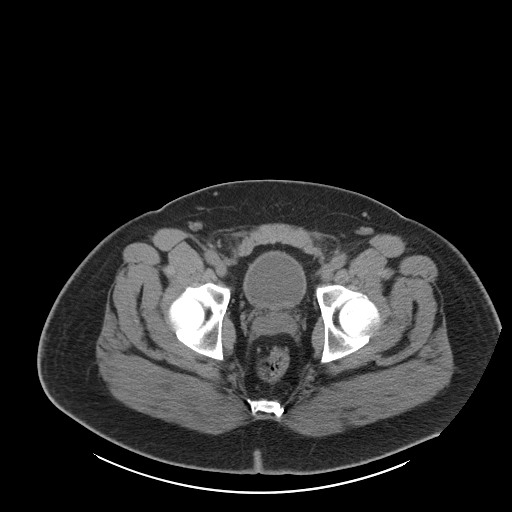
[im 24/102  soft-tissue]
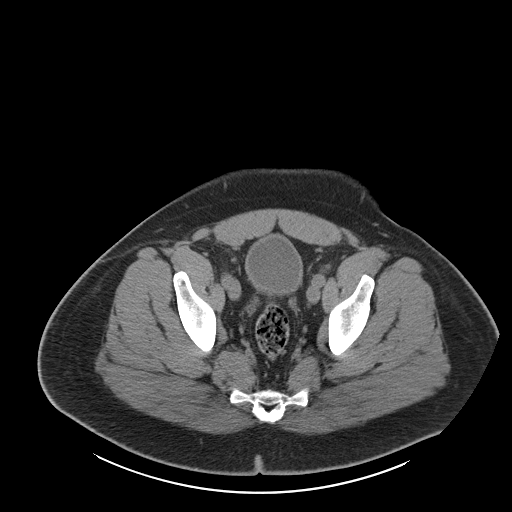
[im 30/102  soft-tissue]
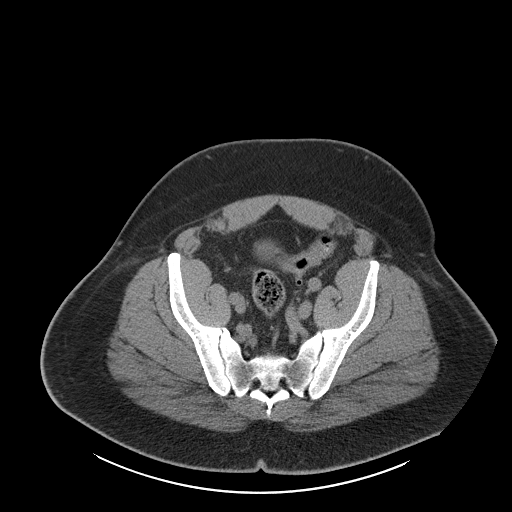
[im 42/102  soft-tissue]
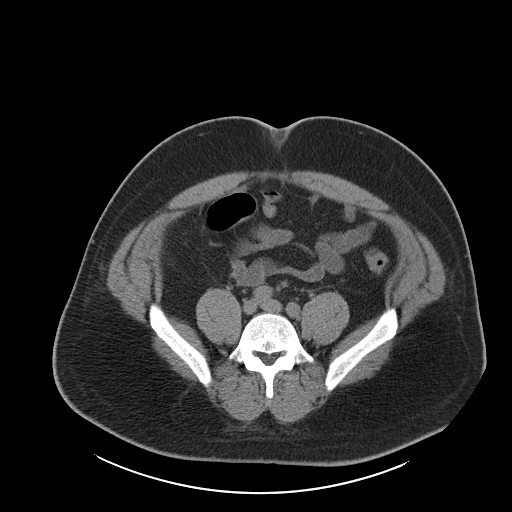
[im 48/102  soft-tissue]
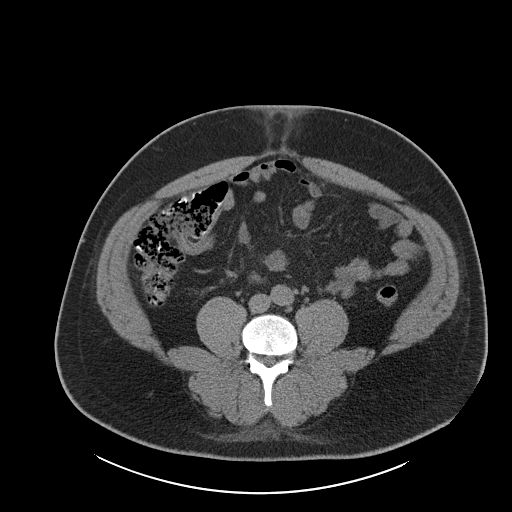
[im 54/102  soft-tissue]
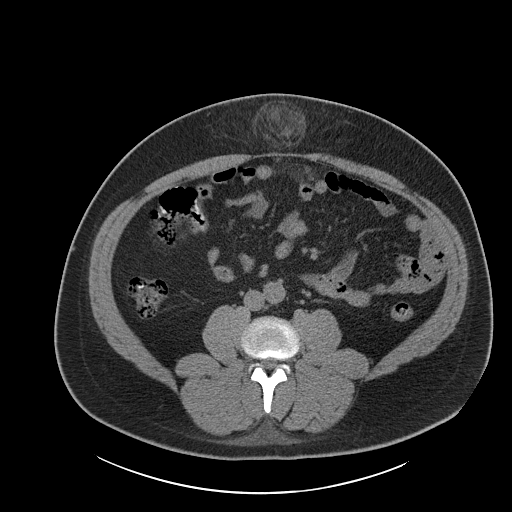
[im 66/102  soft-tissue]
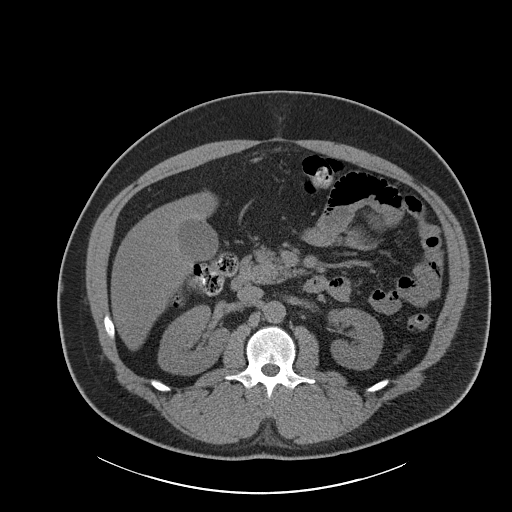
[im 72/102  soft-tissue]
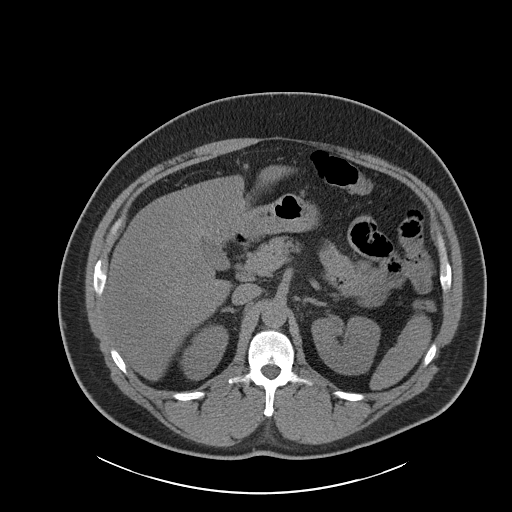
[im 72/102  bone]
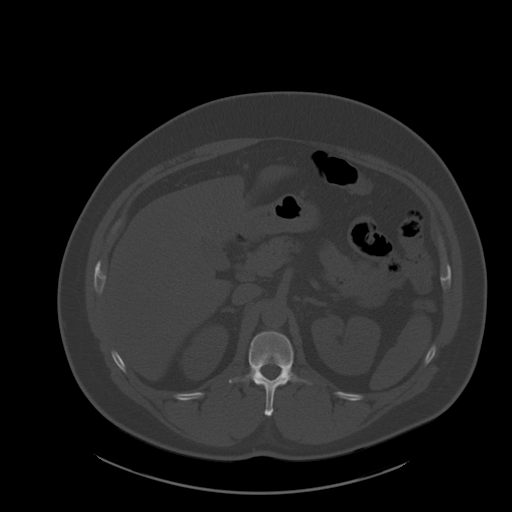
[im 78/102  soft-tissue]
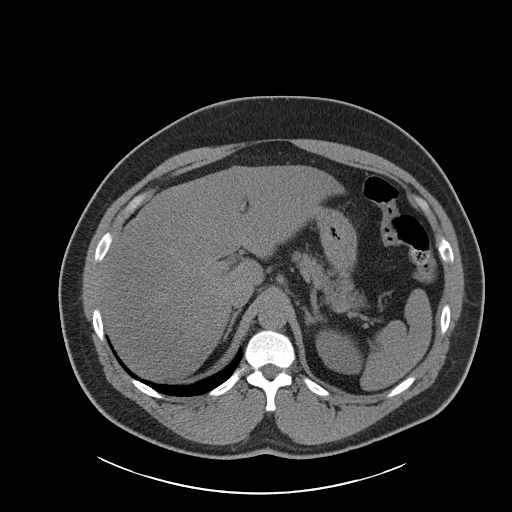
[im 90/102  soft-tissue]
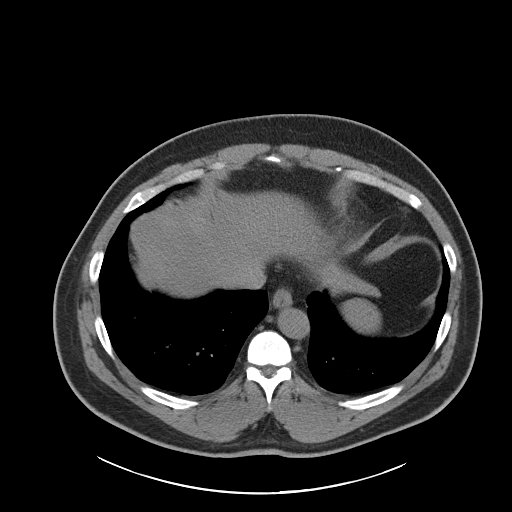
[im 96/102  soft-tissue]
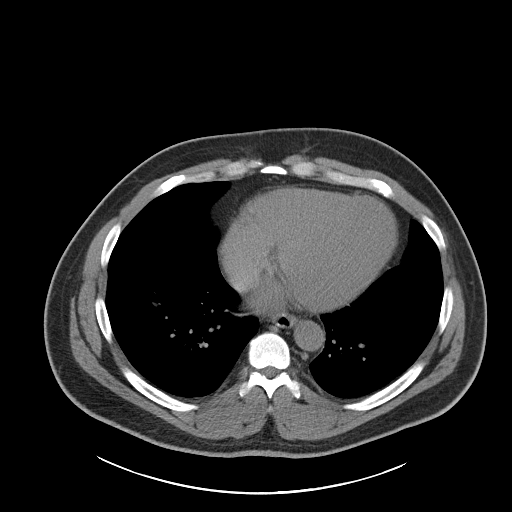

[Series 5: coronal st · coronal · 0.93mm/px · 3 of 202 slices shown]
[im 68/202  soft-tissue]
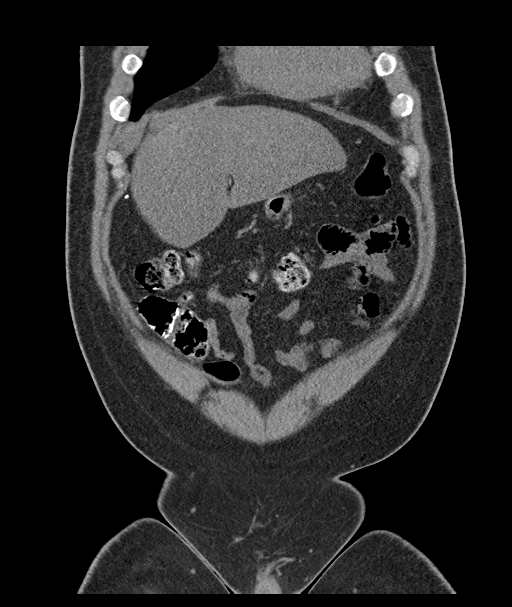
[im 90/202  soft-tissue]
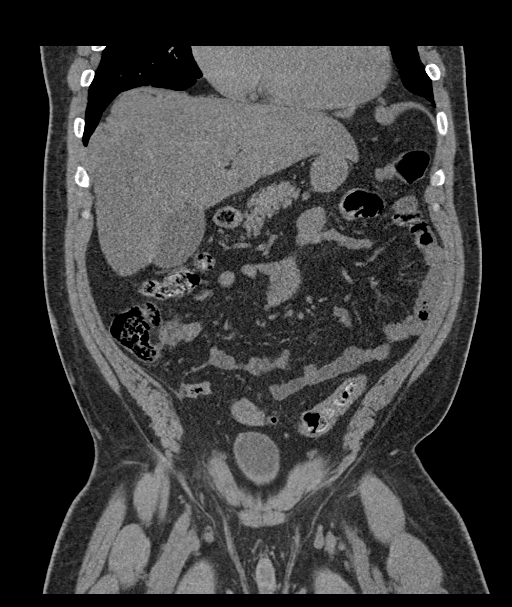
[im 112/202  soft-tissue]
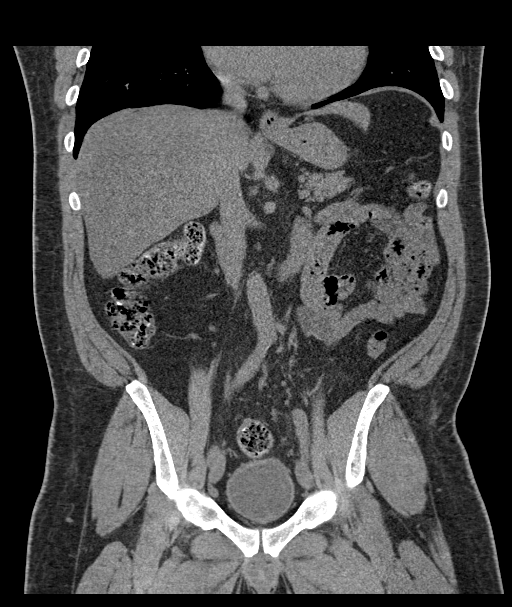

[15 of 46 positions shown; findings below may reference images not displayed]

FINDINGS: Lower chest: Unremarkable.

Hepatobiliary: The liver shows diffusely decreased attenuation
suggesting fat deposition. Subcapsular sparing noted along the
gallbladder fossa. No focal abnormality in the liver on this study
without intravenous contrast. There is no evidence for gallstones,
gallbladder wall thickening, or pericholecystic fluid. No
intrahepatic or extrahepatic biliary dilation.

Pancreas: No focal mass lesion. No dilatation of the main duct. No
intraparenchymal cyst. No peripancreatic edema.

Spleen: No splenomegaly. No focal mass lesion.

Adrenals/Urinary Tract: No adrenal nodule or mass. Right kidney
unremarkable. 6 mm subcapsular lesion upper pole left kidney is too
small to characterize but stable and likely benign. No evidence for
hydroureter. The urinary bladder appears normal for the degree of
distention.

Stomach/Bowel: Stomach decompressed. Duodenum is normally positioned
as is the ligament of Treitz. No small bowel wall thickening. No
small bowel dilatation. The terminal ileum is normal. The appendix
is not well visualized, but there is no edema or inflammation in the
region of the cecum. No gross colonic mass. No colonic wall
thickening. Diverticular changes are noted in the left colon without
evidence of diverticulitis.

Vascular/Lymphatic: No abdominal aortic aneurysm. No abdominal
aortic atherosclerotic calcification. There is no gastrohepatic or
hepatoduodenal ligament lymphadenopathy. No retroperitoneal or
mesenteric lymphadenopathy. No pelvic sidewall lymphadenopathy.

Reproductive: The prostate gland and seminal vesicles are
unremarkable.

Other: No intraperitoneal free fluid.

Musculoskeletal: No worrisome lytic or sclerotic osseous
abnormality.

Supraumbilical midline ventral hernia identified with fascial defect
approximately 7-8 cm cranial to the umbilicus. The defect in the
fascia measures 2.4 x 1.3 cm with preperitoneal and probably omental
fat extending through the defect with hernia sac measuring 5.1 x
x 5.2 cm. There is edema and fluid in the hernia sac concerning for
fat incarceration. Subtle edema noted in the subcutaneous fat around
the hernia sac.

Associated umbilical hernia identified with hernia sac measuring
x 3.4 x 3.0 cm. No substantial edema or inflammation in the
umbilical hernia.
IMPRESSION: 1. Supraumbilical midline ventral hernia with fascial defect
approximately 7-8 cm cranial to the umbilicus. Hernia sac contains
preperitoneal and probably omental fat measuring 5.1 x 6.6 x 5.2 cm.
There is edema and fluid in the hernia sac concerning for fat
incarceration. Subtle edema noted in the subcutaneous fat around the
hernia sac.
2. Associated umbilical hernia with hernia sac containing only fat
and measuring 4.1 x 3.4 x 3.0 cm. No substantial edema or
inflammation in the umbilical hernia.
3. Hepatic steatosis.

## 2022-03-28 ENCOUNTER — Other Ambulatory Visit: Payer: Self-pay

## 2022-03-30 ENCOUNTER — Ambulatory Visit: Payer: 59 | Admitting: Internal Medicine

## 2022-03-31 ENCOUNTER — Other Ambulatory Visit: Payer: Self-pay

## 2022-04-04 ENCOUNTER — Other Ambulatory Visit: Payer: Self-pay

## 2022-04-04 ENCOUNTER — Encounter: Payer: Self-pay | Admitting: Internal Medicine

## 2022-04-04 ENCOUNTER — Ambulatory Visit: Payer: Medicaid Other | Attending: Internal Medicine | Admitting: Internal Medicine

## 2022-04-04 VITALS — BP 120/90 | HR 89 | Ht 75.0 in | Wt 315.0 lb

## 2022-04-04 DIAGNOSIS — I719 Aortic aneurysm of unspecified site, without rupture: Secondary | ICD-10-CM

## 2022-04-04 DIAGNOSIS — G4730 Sleep apnea, unspecified: Secondary | ICD-10-CM

## 2022-04-04 DIAGNOSIS — I5021 Acute systolic (congestive) heart failure: Secondary | ICD-10-CM | POA: Diagnosis not present

## 2022-04-04 DIAGNOSIS — Z79899 Other long term (current) drug therapy: Secondary | ICD-10-CM | POA: Diagnosis not present

## 2022-04-04 NOTE — Progress Notes (Signed)
Cardiology Office Note:    Date:  04/04/2022   ID:  NAHSIR Joseph Ray, DOB [birth year], MRN [id number]  PCP:  Virl Axe, MD   [name] Providers Cardiologist:  None     Referring MD: Virl Axe, MD   No chief complaint on file. Hospital Follow up  History of Present Illness:    Joseph Ray is a 47 y.o. 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 Joseph Ray presented with hypertensive emergency and chest pressure. He was noted to have an NSTEMI. His blood pressures were controlled and he went for [facility]. On his cath, he had large thrombus burden, the vessel was tortuous and risk was greater than benefit.  His EF was 30-35% with grade II DD. He was discharged on GDMT.   Initially, he states he walks everyday up to 35 minutes per day. He no longer has chest pressure. He denies orthopnea, PND or LE edema.  At home his Bps are well controlled. He did not take his medication this AM. He also notes that he does not have his CPAP despite diagnosis of sleep apnea.  Interim Hx 04/04/2022 Does not have CPAP; having apnea at night. No orthopnea/PND. No LE edema. No chest pressure. No changes in his family hx.  Blood pressures are normal today 120/90 mmhg. Notes some anxiety with his new job   Cardiology Studies:  Cardiac Cath 08/16/2021    Dist RCA lesion is 50% stenosed, after more proximal aneurysm.   Mid Cx lesion is 99% stenosed.  Heavy thrombus and an aneurysmal segment.  Culprit, but not a candudate for PCI.   LV end diastolic pressure is mildly elevated.   There is no aortic valve stenosis.   Severe, right subclavian tortuosity which made torquing catheters somewhat difficult.  The left system was straightforward to engage, but  the RCA was more difficult, but ultimately successful.  Could consider destination sheath if using the right radial approach in the future, but this was not needed today.   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  The mid circumflex appears to be the culprit.  This large aneurysmal segment appears to be filled with thrombus.  There is some flow through the circumflex to fill the distal vessel.  There severe retroflexion of the circumflex as well.  I do not think intervention would be possible given the angulation and the large burden of thrombus.  He has not had pain in 2 days.  Would treat with dual antiplatelet therapy and aggressive secondary prevention, along with medical therapy for his heart failure.  No other severe disease noted.   Consider indefinite dual antiplatelet therapy.  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 08/13/2021   Need for assessment for sleep apnea    NSTEMI (non-ST elevated myocardial infarction) (Joseph Ray)    Prediabetes  Past Surgical History:  Procedure Laterality Date   ACHILLES TENDON SURGERY Left 03/25/2013   Procedure: LEFT ACHILLES TENDON REPAIR;  Surgeon: [name], MD;  Location: [facility];  Service: Orthopedics;  Laterality: Left;   LEFT HEART CATH AND CORONARY ANGIOGRAPHY N/A 08/16/2021   Procedure: LEFT HEART CATH AND CORONARY ANGIOGRAPHY;  Surgeon: [name], MD;  Location: [facility] INVASIVE CV LAB;  Service: Cardiovascular;  Laterality: N/A;    Current Medications: Current Meds  Medication Sig   aspirin EC 81 MG tablet Take 1 tablet (81  mg total) by mouth daily. Swallow whole.   atorvastatin (LIPITOR) 80 MG tablet Take 1 tablet (80 mg total) by mouth daily.   carvedilol (COREG) 6.25 MG tablet Take 1 tablet (6.25 mg total) by mouth 2 (two) times daily with a meal.   clopidogrel (PLAVIX) 75 MG tablet Take 1 tablet (75 mg total) by mouth daily.   dapagliflozin propanediol (FARXIGA) 10 MG TABS tablet Take 1 tablet (10 mg total) by mouth daily before breakfast.   furosemide (LASIX) 40 MG tablet Take 1 tablet (40 mg total) by mouth daily.   hydrALAZINE (APRESOLINE) 25 MG tablet Take 1 tablet (25 mg total) by mouth 3 (three) times daily.   isosorbide dinitrate (ISORDIL) 10 MG tablet Take 1 tablet (10 mg total) by mouth 3 (three) times daily.   nitroGLYCERIN (NITROSTAT) 0.4 MG SL tablet Place 1 tablet (0.4 mg total) under the tongue every 5 (five) minutes as needed.   sacubitril-valsartan (ENTRESTO) 24-26 MG Take 1 tablet by mouth 2 (two) times daily.     Allergies:   Penicillins   Social History   Socioeconomic History   Marital status: Single    Spouse name: Not on file   Number of children: 3   Years of education: Not on file   Highest education level: Not on file  Occupational History   Not on file  Tobacco Use   Smoking status: Some Days    Packs/day: 0.15    Years: 15.00    Total pack years: 2.25    Types: Cigarettes   Smokeless tobacco: Never  Vaping Use   Vaping Use: Never used  Substance and Sexual Activity   Alcohol use: Yes    Comment: social   Drug use: No   Sexual activity: Not on file  Other Topics Concern   Not on file  Social History Narrative   Not on file   Social Determinants of Health   Financial Resource Strain: Not on file  Food Insecurity: Not on file  Transportation Needs: Not on file  Physical Activity: Not on file  Stress: Not on file  Social Connections: Not on file     Family History: The patient's family history includes Cancer in his paternal aunt; Heart disease in his  father and paternal grandfather; Hypertension in his brother, father, mother, and paternal grandfather. Father had MI at 13, hx of CABG  ROS:   Please see the history of present illness.     All other systems reviewed and are negative.  EKGs/Labs/Other Studies Reviewed:    The following studies were reviewed today:   EKG:  EKG is  ordered today.  The ekg ordered today demonstrates   09/01/2021-NSR, LVH with repol  04/04/2022- NSR, LVH, PACs; inferior q  Recent Labs: 08/13/2021: ALT 23; B Natriuretic Peptide 131.6 08/16/2021: Hemoglobin 14.1; Platelets 226 08/18/2021: BUN 18; Creatinine, Ser 1.26; Potassium 4.0; Sodium 137   Recent Lipid Panel  Component Value Date/Time   CHOL 193 03/12/2020 0455   TRIG 152 (H) 03/12/2020 0455   HDL 26 (L) 03/12/2020 0455   CHOLHDL 7.4 03/12/2020 0455   VLDL 30 03/12/2020 0455   LDLCALC 137 (H) 03/12/2020 0455     Risk Assessment/Calculations:           Physical Exam:    VS:  Vitals:   04/04/22 1603  BP: (!) 120/90  Pulse: 89  SpO2: 95%      Wt Readings from Last 3 Encounters:  10/20/21 (!) 300 lb 3.2 oz (136.2 kg)  09/01/21 293 lb 3.2 oz (133 kg)  08/18/21 292 lb 11.2 oz (132.8 kg)     GEN:  Well nourished, well developed in no acute distress HEENT: Normal NECK: No JVD; No carotid bruits LYMPHATICS: No lymphadenopathy CARDIAC: RRR, no murmurs, rubs, gallops RESPIRATORY:  Clear to auscultation without rales, wheezing or rhonchi  ABDOMEN: Soft, non-tender, non-distended MUSCULOSKELETAL:  No edema; No deformity  SKIN: Warm and dry NEUROLOGIC:  Alert and oriented x 3 PSYCHIATRIC:  Normal affect   ASSESSMENT:    #Systolic Heart Failure:  NYHA Class I EF 14-78% noted systolic heart failure since 2021, initially thought was non-ischemic,has had poorly controlled HTN in the setting of inconsistent medication related to job loss. He had LHC for NSTEMI 08/16/2021. - stopped lisinopril and started entresto 24/26 mmg BID  -  continue coreg 6.25 mg BID - continue imdur 10/hydralazine 25 mg TID - continue farxiga 10 mg daily - continue lasix 40 mg daily -->repeating echo 3 months post GDMT - if NYHA class progresses or EF 30 or less will send to EP for primary prevention ICD  #Ischemic Heart Disease: has known ischemic disease. His left circ. lesion was 99% in the mid segment c/f thrombus. Intervening would have been more risk then benefit. Plan is to continue DAPT until June 2024 - continue aspirin 81 mg daily - continue plavix 75 mg daily - continue lipitor 80 mg daily - nitro SL PRN  #HTN: per above. Was not taking medications appropriately. He has a BP cuff at home. Recommend continued monitoring. Agree that OSA is contributing to his HTN and heart failure which has been explained to him by his PCP.   #OSA: has signs of sleep apnea as well as HTN. Sending referral to pulm for sleep study and CPAP  #Mild-moderate MR: functional. Plan for medication management.  #Ascending Aortic Aneurysm: yearly assessments for ascending aneurysm 4.3 cm, prior 03/11/2020  PLAN:    In order of problems listed above:  Repeat CT chest angio for aneursym surveillance Limited TTE for LV function on GDMT Sleep study pulm referral Follow up in 6 months      Medication Adjustments/Labs and Tests Ordered: Current medicines are reviewed at length with the patient today.  Concerns regarding medicines are outlined above.  No orders of the defined types were placed in this encounter.  No orders of the defined types were placed in this encounter.   There are no Patient Instructions on file for this visit.   Signed, Janina Mayo, MD  04/04/2022 4:05 PM    [facility]

## 2022-04-04 NOTE — Patient Instructions (Addendum)
Medication Instructions:  Your physician recommends that you continue on your current medications as directed. Please refer to the Current Medication list given to you today.  *If you need a refill on your cardiac medications before your next appointment, please call your pharmacy*   Lab Work: Your physician recommends that you return to have labs drawn at your earliest convenience: BMET  If you have labs (blood work) drawn today and your tests are completely normal, you will receive your results only by: MyChart Message (if you have MyChart) OR A paper copy in the mail If you have any lab test that is abnormal or we need to change your treatment, we will call you to review the results.   Testing/Procedures: Your physician recommends that you have a CT Aorta and somebody will reach out to you for scheduling.   Your Physician recommends that you have a limited echocardiogram performed.     Follow-Up: At [facility], you and your health needs are our priority.  As part of our continuing mission to provide you with exceptional heart care, we have created designated Provider Care Teams.  These Care Teams include your primary Cardiologist (physician) and Advanced Practice Providers (APPs -  Physician Assistants and Nurse Practitioners) who all work together to provide you with the care you need, when you need it.  We recommend signing up for the patient portal called "MyChart".  Sign up information is provided on this After Visit Summary.  MyChart is used to connect with patients for Virtual Visits (Telemedicine).  Patients are able to view lab/test results, encounter notes, upcoming appointments, etc.  Non-urgent messages can be sent to your provider as well.   To learn more about what you can do with MyChart, go to [email or web address].    Your next appointment:   6 month(s)  Provider:   Janina Mayo, MD

## 2022-04-06 ENCOUNTER — Other Ambulatory Visit (HOSPITAL_COMMUNITY): Payer: Self-pay

## 2022-04-06 ENCOUNTER — Other Ambulatory Visit: Payer: Self-pay

## 2022-04-08 ENCOUNTER — Other Ambulatory Visit: Payer: Self-pay

## 2022-04-09 ENCOUNTER — Encounter: Payer: Self-pay | Admitting: Internal Medicine

## 2022-04-09 ENCOUNTER — Other Ambulatory Visit: Payer: Self-pay | Admitting: Internal Medicine

## 2022-04-10 ENCOUNTER — Other Ambulatory Visit: Payer: Self-pay

## 2022-04-10 DIAGNOSIS — Z79899 Other long term (current) drug therapy: Secondary | ICD-10-CM | POA: Diagnosis not present

## 2022-04-10 DIAGNOSIS — R079 Chest pain, unspecified: Secondary | ICD-10-CM | POA: Diagnosis not present

## 2022-04-10 DIAGNOSIS — I2510 Atherosclerotic heart disease of native coronary artery without angina pectoris: Secondary | ICD-10-CM | POA: Diagnosis not present

## 2022-04-10 DIAGNOSIS — Z7902 Long term (current) use of antithrombotics/antiplatelets: Secondary | ICD-10-CM | POA: Insufficient documentation

## 2022-04-10 DIAGNOSIS — R2243 Localized swelling, mass and lump, lower limb, bilateral: Secondary | ICD-10-CM | POA: Insufficient documentation

## 2022-04-10 DIAGNOSIS — R0602 Shortness of breath: Secondary | ICD-10-CM | POA: Insufficient documentation

## 2022-04-10 DIAGNOSIS — Z7982 Long term (current) use of aspirin: Secondary | ICD-10-CM | POA: Insufficient documentation

## 2022-04-10 NOTE — ED Triage Notes (Addendum)
Arrives POV from home. Reports HTN at home and difficulty sleep due to SOB x1 day.  Reports central chest pressure since yesterday.  HX OSA, PE- no longer on thinners, CHF. No recent illness and taking meds as directed. Took NTG 2hr PTA.   Upon arrival 167/117 R arm  164/124 L arm

## 2022-04-11 ENCOUNTER — Emergency Department (HOSPITAL_BASED_OUTPATIENT_CLINIC_OR_DEPARTMENT_OTHER)
Admission: EM | Admit: 2022-04-11 | Discharge: 2022-04-11 | Disposition: A | Discharge status: Home / Self Care | Payer: Medicaid Other | Attending: Emergency Medicine | Admitting: Emergency Medicine

## 2022-04-11 ENCOUNTER — Other Ambulatory Visit: Payer: Self-pay

## 2022-04-11 ENCOUNTER — Emergency Department (HOSPITAL_BASED_OUTPATIENT_CLINIC_OR_DEPARTMENT_OTHER): Payer: Medicaid Other

## 2022-04-11 DIAGNOSIS — R0602 Shortness of breath: Secondary | ICD-10-CM

## 2022-04-11 DIAGNOSIS — I10 Essential (primary) hypertension: Secondary | ICD-10-CM

## 2022-04-11 LAB — BASIC METABOLIC PANEL
Anion gap: 11 (ref 5–15)
BUN: 14 mg/dL (ref 6–20)
CO2: 22 mmol/L (ref 22–32)
Calcium: 9.5 mg/dL (ref 8.9–10.3)
Chloride: 105 mmol/L (ref 98–111)
Creatinine, Ser: 1.08 mg/dL (ref 0.61–1.24)
GFR, Estimated: >60 mL/min (ref >60)
Glucose, Bld: 143 mg/dL — ABNORMAL HIGH (ref 70–99)
Potassium: 3.9 mmol/L (ref 3.5–5.1)
Sodium: 138 mmol/L (ref 135–145)

## 2022-04-11 LAB — CBC
HCT: 41.4 % (ref 39.0–52.0)
Hemoglobin: 14.1 g/dL (ref 13.0–17.0)
MCH: 30.4 pg (ref 26.0–34.0)
MCHC: 34.1 g/dL (ref 30.0–36.0)
MCV: 89.2 fL (ref 80.0–100.0)
Platelets: 231 10*3/uL (ref 150–400)
RBC: 4.64 MIL/uL (ref 4.22–5.81)
RDW: 14.2 % (ref 11.5–15.5)
WBC: 8.4 10*3/uL (ref 4.0–10.5)
nRBC: 0 % (ref 0.0–0.2)

## 2022-04-11 LAB — D-DIMER, QUANTITATIVE: D-Dimer, Quant: 0.92 ug/mL-FEU — ABNORMAL HIGH (ref 0.00–0.50)

## 2022-04-11 LAB — TROPONIN I (HIGH SENSITIVITY)
Troponin I (High Sensitivity): 32 ng/L — ABNORMAL HIGH (ref <18)
Troponin I (High Sensitivity): 38 ng/L — ABNORMAL HIGH (ref <18)

## 2022-04-11 LAB — BRAIN NATRIURETIC PEPTIDE: B Natriuretic Peptide: 114.9 pg/mL — ABNORMAL HIGH (ref 0.0–100.0)

## 2022-04-11 MED ORDER — ENTRESTO 24-26 MG PO TABS: 1.0000 | ORAL_TABLET | Freq: Two times a day (BID) | ORAL | 2 refills | Status: DC

## 2022-04-11 MED ORDER — IOHEXOL 350 MG/ML SOLN
100.0000 mL | Freq: Once | INTRAVENOUS | Status: AC | PRN
  Administered 2022-04-11: 100 mL via INTRAVENOUS

## 2022-04-11 MED ORDER — ATORVASTATIN CALCIUM 80 MG PO TABS: 80.0000 mg | ORAL_TABLET | Freq: Every day | ORAL | 2 refills | Status: DC

## 2022-04-11 MED ORDER — ISOSORBIDE DINITRATE 10 MG PO TABS: 10.0000 mg | ORAL_TABLET | Freq: Once | ORAL | Status: DC

## 2022-04-11 MED ORDER — CARVEDILOL 6.25 MG PO TABS
6.2500 mg | ORAL_TABLET | Freq: Two times a day (BID) | ORAL | Status: DC
  Filled 2022-04-11: qty 1

## 2022-04-11 MED ORDER — CARVEDILOL 6.25 MG PO TABS: 6.2500 mg | ORAL_TABLET | Freq: Two times a day (BID) | ORAL | 2 refills | Status: DC

## 2022-04-11 MED ORDER — HYDRALAZINE HCL 25 MG PO TABS
25.0000 mg | ORAL_TABLET | Freq: Once | ORAL | Status: AC
  Administered 2022-04-11: 25 mg via ORAL
  Filled 2022-04-11: qty 1

## 2022-04-11 MED ORDER — FUROSEMIDE 40 MG PO TABS: 40.0000 mg | ORAL_TABLET | Freq: Every day | ORAL | 0 refills | Status: DC

## 2022-04-11 MED ORDER — CLOPIDOGREL BISULFATE 75 MG PO TABS: 75.0000 mg | ORAL_TABLET | Freq: Every day | ORAL | 3 refills | Status: DC

## 2022-04-11 MED ORDER — ISOSORBIDE DINITRATE 10 MG PO TABS: 10.0000 mg | ORAL_TABLET | Freq: Three times a day (TID) | ORAL | 2 refills | Status: DC

## 2022-04-11 MED ORDER — CARVEDILOL 6.25 MG PO TABS
6.2500 mg | ORAL_TABLET | Freq: Two times a day (BID) | ORAL | Status: DC
  Administered 2022-04-11: 6.25 mg via ORAL

## 2022-04-11 MED ORDER — HYDRALAZINE HCL 25 MG PO TABS: 25.0000 mg | ORAL_TABLET | Freq: Three times a day (TID) | ORAL | 2 refills | Status: DC

## 2022-04-11 MED ORDER — DAPAGLIFLOZIN PROPANEDIOL 10 MG PO TABS: 10.0000 mg | ORAL_TABLET | Freq: Every day | ORAL | 3 refills | Status: DC

## 2022-04-11 MED ORDER — NITROGLYCERIN 0.4 MG SL SUBL: 0.4000 mg | SUBLINGUAL_TABLET | SUBLINGUAL | 3 refills | Status: AC | PRN

## 2022-04-11 NOTE — ED Notes (Signed)
Pt agreeable with d/c plan as discussed by provider- this nurse has verbally reinforced d/c instructions and provided pt with written copy; pt acknowledges verbal understanding and denies any add'l questions, concerns, needs.   

## 2022-04-11 NOTE — ED Notes (Signed)
Patient transported to CT via w/c escorted by [facility]

## 2022-04-11 NOTE — Discharge Instructions (Signed)
You were seen today for shortness of breath and concerns for high blood pressure.  It is very important that you take your blood pressure medications as prescribed.  You were given refills of all your blood pressure medications.  Follow-up with your cardiologist regarding your visit today.

## 2022-04-11 NOTE — ED Provider Notes (Signed)
[facility] Provider Note   CSN: [id number] Arrival date & time: 04/10/22  2340     History  Chief Complaint  Patient presents with   Chest Pain   Shortness of Breath    Joseph Ray is a 47 y.o. male.  HPI     This is a 47 year old male with a history of coronary artery disease, OSA, ischemic cardiomyopathy who presents with chest discomfort and shortness of breath.  Patient reports that his blood pressure at home has been high.  He saw his doctor on Wednesday but there was a except in his medications and he has not taken his blood pressure medicine since Wednesday.  He states that yesterday he noted that he woke up feeling very dyspneic from sleep.  Has had some upper pressure-like chest discomfort during this time.  Reports that he has been compliant with his Lasix and does not feel that he is retaining more fluid than normal.  No cough or fevers.  Home Medications Prior to Admission medications   Medication Sig Start Date End Date Taking? Authorizing Provider  aspirin EC 81 MG tablet Take 1 tablet (81 mg total) by mouth daily. Swallow whole. 08/17/21   Little Ishikawa, MD  atorvastatin (LIPITOR) 80 MG tablet Take 1 tablet (80 mg total) by mouth daily. 08/17/21   Little Ishikawa, MD  carvedilol (COREG) 6.25 MG tablet Take 1 tablet (6.25 mg total) by mouth 2 (two) times daily with a meal. 04/11/22   Jasen Hartstein, Barbette Hair, MD  clopidogrel (PLAVIX) 75 MG tablet Take 1 tablet (75 mg total) by mouth daily. 08/17/21   Little Ishikawa, MD  dapagliflozin propanediol (FARXIGA) 10 MG TABS tablet Take 1 tablet (10 mg total) by mouth daily before breakfast. 09/01/21   Janina Mayo, MD  furosemide (LASIX) 40 MG tablet Take 1 tablet (40 mg total) by mouth daily. 04/11/22   Zoei Amison, Barbette Hair, MD  hydrALAZINE (APRESOLINE) 25 MG tablet Take 1 tablet (25 mg total) by mouth 3 (three) times daily. 04/11/22   Teondre Jarosz, Barbette Hair, MD  isosorbide  dinitrate (ISORDIL) 10 MG tablet Take 1 tablet (10 mg total) by mouth 3 (three) times daily. 04/11/22   Donye Dauenhauer, Barbette Hair, MD  nitroGLYCERIN (NITROSTAT) 0.4 MG SL tablet Place 1 tablet (0.4 mg total) under the tongue every 5 (five) minutes as needed. 09/01/21 04/04/22  Janina Mayo, MD  sacubitril-valsartan (ENTRESTO) 24-26 MG Take 1 tablet by mouth 2 (two) times daily. 04/11/22   Asif Muchow, Barbette Hair, MD      Allergies    Penicillins    Review of Systems   Review of Systems  Constitutional:  Negative for fever.  Respiratory:  Positive for shortness of breath.   Cardiovascular:  Positive for chest pain. Negative for leg swelling.  All other systems reviewed and are negative.   Physical Exam Updated Vital Signs BP (!) 144/95   Pulse 93   Temp 98.3 F (36.8 C) (Oral)   Resp (!) 24   SpO2 97%  Physical Exam Vitals and nursing note reviewed.  Constitutional:      Appearance: He is well-developed. He is obese. He is not ill-appearing.  HENT:     Head: Normocephalic and atraumatic.  Eyes:     Pupils: Pupils are equal, round, and reactive to light.  Cardiovascular:     Rate and Rhythm: Normal rate and regular rhythm.     Heart sounds: Normal heart sounds. No murmur  heard. Pulmonary:     Effort: Pulmonary effort is normal. No respiratory distress.     Breath sounds: Normal breath sounds. No wheezing.  Abdominal:     General: Bowel sounds are normal.     Palpations: Abdomen is soft.     Tenderness: There is no abdominal tenderness. There is no rebound.  Musculoskeletal:     Cervical back: Neck supple.     Right lower leg: Edema present.     Left lower leg: Edema present.     Comments: Trace bilateral lower extremity edema  Lymphadenopathy:     Cervical: No cervical adenopathy.  Skin:    General: Skin is warm and dry.  Neurological:     Mental Status: He is alert and oriented to person, place, and time.  Psychiatric:        Mood and Affect: Mood normal.     ED Results /  Procedures / Treatments   Labs (all labs ordered are listed, but only abnormal results are displayed) Labs Reviewed  BASIC METABOLIC PANEL - Abnormal; Notable for the following components:      Result Value   Glucose, Bld 143 (*)    All other components within normal limits  D-DIMER, QUANTITATIVE - Abnormal; Notable for the following components:   D-Dimer, Quant 0.92 (*)    All other components within normal limits  BRAIN NATRIURETIC PEPTIDE - Abnormal; Notable for the following components:   B Natriuretic Peptide 114.9 (*)    All other components within normal limits  TROPONIN I (HIGH SENSITIVITY) - Abnormal; Notable for the following components:   Troponin I (High Sensitivity) 32 (*)    All other components within normal limits  TROPONIN I (HIGH SENSITIVITY) - Abnormal; Notable for the following components:   Troponin I (High Sensitivity) 38 (*)    All other components within normal limits  CBC    EKG EKG Interpretation  Date/Time:  Monday April 11 2022 00:00:02 EST Ventricular Rate:  101 PR Interval:  178 QRS Duration: 108 QT Interval:  398 QTC Calculation: 516 R Axis:   -33 Text Interpretation: Sinus tachycardia Biatrial enlargement Left axis deviation Incomplete right bundle branch block Left ventricular hypertrophy ( R in aVL , Cornell product , Romhilt-Estes ) Abnormal ECG When compared with ECG of 15-Aug-2021 09:11, ST no longer depressed in Lateral leads T wave inversion no longer evident in Lateral leads No significant change since last tracing Confirmed by Thayer Jew [phone]) on 04/11/2022 12:10:04 AM  Radiology CT Angio Chest PE W and/or Wo Contrast  Result Date: 04/11/2022 CLINICAL DATA:  Shortness of breath, central chest pressure, evaluate for PE EXAM: CT ANGIOGRAPHY CHEST WITH CONTRAST TECHNIQUE: Multidetector CT imaging of the chest was performed using the standard protocol during bolus administration of intravenous contrast. Multiplanar CT image  reconstructions and MIPs were obtained to evaluate the vascular anatomy. RADIATION DOSE REDUCTION: This exam was performed according to the departmental dose-optimization program which includes automated exposure control, adjustment of the mA and/or kV according to patient size and/or use of iterative reconstruction technique. CONTRAST:  180mL OMNIPAQUE IOHEXOL 350 MG/ML SOLN COMPARISON:  03/11/2020 FINDINGS: Cardiovascular: Satisfactory opacification of the bilateral pulmonary arteries to the segmental level. No evidence of pulmonary embolism. Study is not tailored for evaluation of the thoracic aorta. No evidence of thoracic aortic aneurysm. Aberrant right subclavian artery. Mild cardiomegaly.  No pericardial effusion. Mediastinum/Nodes: No suspicious mediastinal lymphadenopathy. Visualized thyroid is unremarkable. Lungs/Pleura: Lungs are clear. No suspicious pulmonary nodules. No focal consolidation.  No pleural effusion or pneumothorax. Upper Abdomen: Visualized upper abdomen is notable for mild hepatic steatosis. Musculoskeletal: Visualized osseous structures are within normal limits. Review of the MIP images confirms the above findings. IMPRESSION: No evidence of pulmonary embolism. No evidence of acute cardiopulmonary disease. Electronically Signed   By: Charline Bills M.D.   On: 04/11/2022 02:04   DG Chest Portable 1 View  Result Date: 04/11/2022 CLINICAL DATA:  Chest pain and shortness of breath for 1 day EXAM: PORTABLE CHEST 1 VIEW COMPARISON:  08/13/2021 FINDINGS: Cardiac shadow is enlarged but stable. The lungs are well aerated bilaterally. No focal infiltrate or effusion is seen. No bony abnormality is noted. IMPRESSION: No active disease. Electronically Signed   By: Alcide Clever M.D.   On: 04/11/2022 01:57    Procedures Procedures    Medications Ordered in ED Medications  carvedilol (COREG) tablet 6.25 mg (6.25 mg Oral Given 04/11/22 0312)  iohexol (OMNIPAQUE) 350 MG/ML injection 100 mL  (100 mLs Intravenous Contrast Given 04/11/22 0141)  hydrALAZINE (APRESOLINE) tablet 25 mg (25 mg Oral Given 04/11/22 0310)    ED Course/ Medical Decision Making/ A&P                             Medical Decision Making Amount and/or Complexity of Data Reviewed Labs: ordered. Radiology: ordered.  Risk Prescription drug management.   This patient presents to the ED for concern of high blood pressure, shortness of breath, this involves an extensive number of treatment options, and is a complaint that carries with it a high risk of complications and morbidity.  I considered the following differential and admission for this acute, potentially life threatening condition.  The differential diagnosis includes hypertensive urgency, hypertensive emergency, ACS, CHF, COPD, OSA  MDM:    This is a 47 year old male who presents with shortness of breath.  Also reports some associated chest pressure.  He has a significant history.  He has not had many of his blood pressure medications in several days.  Reported high blood pressures at home.  Initial blood pressure here 164/117.  He is in no respiratory distress.  He does not appear overtly volume overloaded.  EKG is unchanged from prior.  No acute ischemia or arrhythmia.  Chest x-ray shows no pulmonary edema, pneumonia, pneumothorax.  Basic lab work obtained.  Troponin x 2 negative.  BNP slightly elevated.  BMP and CBC are reassuring.  Screening D-dimer was sent from triage and this is elevated.  CT scan obtained.  CT does not show any evidence of PE.  Patient has remained hemodynamically stable with slightly downtrending blood pressure to 144/95.  He was given his daytime blood pressure medications.  I will refill his prescriptions based on his most recent cardiology note.  I have stressed to the patient that I feel his symptoms are likely related to uncontrolled hypertension in addition to his ongoing chronic medical conditions.  He needs to follow-up closely  with his cardiologist and take his blood pressure medications as prescribed.  (Labs, imaging, consults)  Labs: I Ordered, and personally interpreted labs.  The pertinent results include: CBC, BMP, BMP, dimer  Imaging Studies ordered: I ordered imaging studies including chest x-ray, CT PE study. I independently visualized and interpreted imaging. I agree with the radiologist interpretation  Additional history obtained from chart review.  External records from outside source obtained and reviewed including cardiology notes  Cardiac Monitoring: The patient was maintained on a cardiac  monitor.  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    Dispense:  60 tablet    Refill:  2   furosemide (LASIX) 40 MG tablet    Sig: Take 1 tablet (40 mg total) by mouth daily.    Dispense:  30 tablet     Refill:  0   hydrALAZINE (APRESOLINE) 25 MG tablet    Sig: Take 1 tablet (25 mg total) by mouth 3 (three) times daily.    Dispense:  90 tablet    Refill:  2   sacubitril-valsartan (ENTRESTO) 24-26 MG    Sig: Take 1 tablet by mouth 2 (two) times daily.    Dispense:  60 tablet    Refill:  2   isosorbide dinitrate (ISORDIL) 10 MG tablet    Sig: Take 1 tablet (10 mg total) by mouth 3 (three) times daily.    Dispense:  90 tablet    Refill:  2    I have reviewed the patients home medicines and have made adjustments as needed  Problem List / ED Course: Problem List Items Addressed This Visit   None Visit Diagnoses     Uncontrolled hypertension    -  Primary   Relevant Medications   hydrALAZINE (APRESOLINE) tablet 25 mg (Completed)   carvedilol (COREG) tablet 6.25 mg   carvedilol (COREG) 6.25 MG tablet   furosemide (LASIX) 40 MG tablet   hydrALAZINE (APRESOLINE) 25 MG tablet   sacubitril-valsartan (ENTRESTO) 24-26 MG   isosorbide dinitrate (ISORDIL) 10 MG tablet   SOB (shortness of breath)                       Final Clinical Impression(s) / ED Diagnoses Final diagnoses:  Uncontrolled hypertension  SOB (shortness of breath)    Rx / DC Orders ED Discharge Orders          Ordered    carvedilol (COREG) 6.25 MG tablet  2 times daily with meals        04/11/22 0341    furosemide (LASIX) 40 MG tablet  Daily        04/11/22 0341    hydrALAZINE (APRESOLINE) 25 MG tablet  3 times daily        04/11/22 0341    sacubitril-valsartan (ENTRESTO) 24-26 MG  2 times daily  04/11/22 0341    isosorbide dinitrate (ISORDIL) 10 MG tablet  3 times daily        04/11/22 0341              Teneshia Hedeen, Mayer Masker, MD 04/11/22 [id number]

## 2022-04-20 ENCOUNTER — Encounter: Payer: Self-pay | Admitting: Primary Care

## 2022-04-20 ENCOUNTER — Ambulatory Visit (INDEPENDENT_AMBULATORY_CARE_PROVIDER_SITE_OTHER): Payer: Medicaid Other | Admitting: Primary Care

## 2022-04-20 VITALS — BP 122/76 | HR 86 | Temp 98.4°F | Ht 75.0 in | Wt 312.2 lb

## 2022-04-20 DIAGNOSIS — R0683 Snoring: Secondary | ICD-10-CM | POA: Diagnosis not present

## 2022-04-20 DIAGNOSIS — I5022 Chronic systolic (congestive) heart failure: Secondary | ICD-10-CM | POA: Diagnosis not present

## 2022-04-20 NOTE — Assessment & Plan Note (Addendum)
-   Appears euvolemic on exam - Continue Entresto and Lasix - Following with cardiology, due for repeat echocardiogram in 3 months

## 2022-04-20 NOTE — Progress Notes (Signed)
@Patient  ID: Joseph Ray, male    DOB: 1975/09/16, 46 y.o.   MRN: [id number]  Chief Complaint  Patient presents with   Consult    Snoring and witness apnea.  Patient has CHF.    Referring provider: Janina Mayo, MD  HPI: 47 year old male, current someday smoker.  Past medical history significant for hypertension, myocardiopathy, NSTEMI, combined systolic and diastolic congestive heart failure, ascending aorta dilation, hyperlipidemia, type 2 diabetes, obesity.  04/20/2022 Patient presents today for sleep consult. Patient has symptoms of loud snoring, witnessed apnea, waking up gasping/choking and daytime sleepiness.  He has a significant cardiac history including CHF and NSTEMI.  Typical bedtime is 11pm. It does not take him long to fall asleep.  He wakes up on average 1-2 times a night.  He starts his day at 7 AM.  His weight is up 20 pounds.  No prior sleep studies.  Denies symptoms of narcolepsy, cataplexy or sleepwalking.  Epworth score 16/24.   Sleep questionnaire Symptoms- loud snoring, witnessed apnea, waking up gasping, daytime sleepiness  Prior sleep study- none Bedtime- 11pm Time to fall asleep- not long  Nocturnal awakenings- 1-2 times  Out of bed/start of day- 7am Weight changes- up 20 lbs  Do you operate heavy machinery- no Do you currently wear CPAP- no Do you current wear oxygen- no Epworth- 16   Immunization History  Administered Date(s) Administered   Influenza-Unspecified 02/17/2022    Past Medical History:  Diagnosis Date   Achilles tendon tear    left   Combined congestive systolic and diastolic heart failure ([place])    COVID-19 virus detected 03/11/2020   Hypertension    Malignant HTN with heart disease, w/o CHF, 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   Counseling given: Not Answered Tobacco comments: Stopped smoking in May 2023.   Outpatient Medications Prior to Visit  Medication Sig Dispense Refill   aspirin EC 81 MG tablet Take 1 tablet (81 mg total) by mouth daily. Swallow whole. 30 tablet 12   atorvastatin (LIPITOR) 80 MG tablet Take 1 tablet (80 mg total) by mouth daily. 30 tablet 2   carvedilol (COREG) 6.25 MG tablet Take 1 tablet (6.25 mg total) by mouth 2 (two) times daily with a meal. 60 tablet 2   clopidogrel (PLAVIX) 75 MG tablet Take 1 tablet (75 mg total) by mouth daily. 90 tablet 3   dapagliflozin propanediol (FARXIGA) 10 MG TABS tablet Take 1 tablet (10 mg total) by mouth daily before breakfast. 30 tablet 3   furosemide (LASIX) 40 MG tablet Take 1 tablet (40 mg total) by mouth daily. 30 tablet 0   hydrALAZINE (APRESOLINE) 25 MG tablet Take 1 tablet (25 mg total) by mouth 3 (three) times daily. 90 tablet 2   isosorbide dinitrate (ISORDIL) 10 MG tablet Take 1 tablet (10 mg total) by mouth 3 (three) times daily. 90 tablet 2   nitroGLYCERIN (NITROSTAT) 0.4 MG SL tablet Place 1 tablet (0.4 mg total) under the tongue every 5 (five) minutes as needed. 25 tablet 3   sacubitril-valsartan (ENTRESTO) 24-26 MG Take 1 tablet by  mouth 2 (two) times daily. 60 tablet 2   No facility-administered medications prior to visit.    Review of Systems  Review of Systems  Constitutional:  Positive for fatigue.  HENT: Negative.    Respiratory:  Positive for apnea.   Cardiovascular: Negative.   Psychiatric/Behavioral:  Positive for sleep disturbance.    Physical Exam  BP 122/76 (BP Location: Right Arm, Patient Position: Sitting, Cuff Size: Large)   Pulse 86   Temp 98.4 F (36.9 C) (Oral)   Ht 6\' 3"  (1.905 m)   Wt (!) 312 lb 3.2 oz (141.6 kg)   SpO2 96%   BMI 39.02 kg/m  Physical  Exam Constitutional:      Appearance: Normal appearance. He is obese.  HENT:     Head: Normocephalic and atraumatic.     Mouth/Throat:     Mouth: Mucous membranes are moist.     Pharynx: Oropharynx is clear.  Cardiovascular:     Rate and Rhythm: Normal rate and regular rhythm.  Pulmonary:     Effort: Pulmonary effort is normal.     Breath sounds: Normal breath sounds.  Musculoskeletal:        General: Normal range of motion.  Skin:    General: Skin is warm and dry.  Neurological:     General: No focal deficit present.     Mental Status: He is alert and oriented to person, place, and time. Mental status is at baseline.  Psychiatric:        Mood and Affect: Mood normal.        Behavior: Behavior normal.        Thought Content: Thought content normal.        Judgment: Judgment normal.      Lab Results:  CBC    Component Value Date/Time   WBC 8.4 04/11/2022 0009   RBC 4.64 04/11/2022 0009   HGB 14.1 04/11/2022 0009   HCT 41.4 04/11/2022 0009   PLT 231 04/11/2022 0009   MCV 89.2 04/11/2022 0009   MCH 30.4 04/11/2022 0009   MCHC 34.1 04/11/2022 0009   RDW 14.2 04/11/2022 0009   LYMPHSABS 1.7 08/13/2021 0800   MONOABS 0.6 08/13/2021 0800   EOSABS 0.1 08/13/2021 0800   BASOSABS 0.0 08/13/2021 0800    BMET    Component Value Date/Time   NA 138 04/11/2022 0009   NA 137 08/18/2021 1056   K 3.9 04/11/2022 0009   CL 105 04/11/2022 0009   CO2 22 04/11/2022 0009   GLUCOSE 143 (H) 04/11/2022 0009   BUN 14 04/11/2022 0009   BUN 18 08/18/2021 1056   CREATININE 1.08 04/11/2022 0009   CALCIUM 9.5 04/11/2022 0009   GFRNONAA >60 04/11/2022 0009    BNP    Component Value Date/Time   BNP 114.9 (H) 04/11/2022 0047    ProBNP No results found for: "PROBNP"  Imaging: CT Angio Chest PE W and/or Wo Contrast  Result Date: 04/11/2022 CLINICAL DATA:  Shortness of breath, central chest pressure, evaluate for PE EXAM: CT ANGIOGRAPHY CHEST WITH CONTRAST TECHNIQUE:  Multidetector CT imaging of the chest was performed using the standard protocol during bolus administration of intravenous contrast. Multiplanar CT image reconstructions and MIPs were obtained to evaluate the vascular anatomy. RADIATION DOSE REDUCTION: This exam was performed according to the departmental dose-optimization program which includes automated exposure control, adjustment of the mA and/or kV according to patient size and/or use of iterative reconstruction technique. CONTRAST:  142mL OMNIPAQUE IOHEXOL 350 MG/ML SOLN COMPARISON:  03/11/2020 FINDINGS: Cardiovascular: Satisfactory opacification of the bilateral pulmonary arteries to the segmental level. No evidence of pulmonary embolism. Study is not tailored for evaluation of the thoracic aorta. No evidence of thoracic aortic aneurysm. Aberrant right subclavian artery. Mild cardiomegaly.  No pericardial effusion. Mediastinum/Nodes: No suspicious mediastinal lymphadenopathy. Visualized thyroid is unremarkable. Lungs/Pleura: Lungs are clear. No suspicious pulmonary nodules. No focal consolidation. No pleural effusion or pneumothorax. Upper Abdomen: Visualized upper abdomen is notable for mild hepatic steatosis. Musculoskeletal: Visualized osseous structures are within normal limits. Review of the MIP images confirms the above findings. IMPRESSION: No evidence of pulmonary embolism. No evidence of acute cardiopulmonary disease. Electronically Signed   By: Julian Hy M.D.   On: 04/11/2022 02:04   DG Chest Portable 1 View  Result Date: 04/11/2022 CLINICAL DATA:  Chest pain and shortness of breath for 1 day EXAM: PORTABLE CHEST 1 VIEW COMPARISON:  08/13/2021 FINDINGS: Cardiac shadow is enlarged but stable. The lungs are well aerated bilaterally. No focal infiltrate or effusion is seen. No bony abnormality is noted. IMPRESSION: No active disease. Electronically Signed   By: Inez Catalina M.D.   On: 04/11/2022 01:57     Assessment & Plan:   Loud  snoring - Patient has symptoms of loud snoring, witnessed apnea and daytime sleepiness.  Epworth score 16.  BMI 39.  I have strong suspicion patient has underlying obstructive sleep apnea, needs in-lab split night sleep study due to history of congestive heart failure and NSTEMI.  We reviewed risks of untreated sleep apnea including cardiac arrhythmias, pulmonary hypertension, diabetes and stroke.  We also discussed treatment options including weight loss, oral appliance, CPAP therapy or referral to ENT for possible surgical options.  Patient is open to CPAP if needed.  Encouraged weight loss efforts.  Advised against driving experiencing excessive daytime sleepiness fatigue.  Patient does not drink alcohol.  Follow-up 1 to 2 weeks after sleep study to review results and treatment options if needed.  Chronic systolic congestive heart failure ([place]) - Appears euvolemic on exam - Continue Entresto and Lasix - Following with cardiology, due for repeat echocardiogram in 3 months    Martyn Ehrich, NP 04/20/2022

## 2022-04-20 NOTE — Patient Instructions (Addendum)
Sleep apnea is defined as period of 10 seconds or longer when you stop breathing at night. This can happen multiple times a night. Dx sleep apnea is when this occurs more than 5 times an hour.    Mild OSA 5-15 apneic events an hour Moderate OSA 15-30 apneic events an hour Severe OSA > 30 apneic events an hour   Untreated sleep apnea puts you at higher risk for cardiac arrhythmias, pulmonary HTN, stroke and diabetes  Treatment options include weight loss, side sleeping position, oral appliance, CPAP therapy or referral to ENT for possible surgical options    Recommendations: Focus on side sleeping position or elevate head with wedge pillow 30 degrees Work on weight loss efforts if able  Do not drive if experiencing excessive daytime sleepiness of fatigue    Orders: Split night sleep study re: loud snoring (ordered)   Follow-up: Please call to schedule follow-up 1-2 weeks after completing home sleep study to review results and treatment if needed (can be virtual)  Sleep Apnea Sleep apnea is a condition in which breathing pauses or becomes shallow during sleep. People with sleep apnea usually snore loudly. They may have times when they gasp and stop breathing for 10 seconds or more during sleep. This may happen many times during the night. Sleep apnea disrupts your sleep and keeps your body from getting the rest that it needs. This condition can increase your risk of certain health problems, including: Heart attack. Stroke. Obesity. Type 2 diabetes. Heart failure. Irregular heartbeat. High blood pressure. The goal of treatment is to help you breathe normally again. What are the causes?  The most common cause of sleep apnea is a collapsed or blocked airway. There are three kinds of sleep apnea: Obstructive sleep apnea. This kind is caused by a blocked or collapsed airway. Central sleep apnea. This kind happens when the part of the brain that controls breathing does not send the  correct signals to the muscles that control breathing. Mixed sleep apnea. This is a combination of obstructive and central sleep apnea. What increases the risk? You are more likely to develop this condition if you: Are overweight. Smoke. Have a smaller than normal airway. Are older. Are male. Drink alcohol. Take sedatives or tranquilizers. Have a family history of sleep apnea. Have a tongue or tonsils that are larger than normal. What are the signs or symptoms? Symptoms of this condition include: Trouble staying asleep. Loud snoring. Morning headaches. Waking up gasping. Dry mouth or sore throat in the morning. Daytime sleepiness and tiredness. If you have daytime fatigue because of sleep apnea, you may be more likely to have: Trouble concentrating. Forgetfulness. Irritability or mood swings. Personality changes. Feelings of depression. Sexual dysfunction. This may include loss of interest if you are male, or erectile dysfunction if you are male. How is this diagnosed? This condition may be diagnosed with: A medical history. A physical exam. A series of tests that are done while you are sleeping (sleep study). These tests are usually done in a sleep lab, but they may also be done at home. How is this treated? Treatment for this condition aims to restore normal breathing and to ease symptoms during sleep. It may involve managing health issues that can affect breathing, such as high blood pressure or obesity. Treatment may include: Sleeping on your side. Using a decongestant if you have nasal congestion. Avoiding the use of depressants, including alcohol, sedatives, and narcotics. Losing weight if you are overweight. Making changes to your  diet. Quitting smoking. Using a device to open your airway while you sleep, such as: An oral appliance. This is a custom-made mouthpiece that shifts your lower jaw forward. A continuous positive airway pressure (CPAP) device. This device  blows air through a mask when you breathe out (exhale). A nasal expiratory positive airway pressure (EPAP) device. This device has valves that you put into each nostril. A bi-level positive airway pressure (BIPAP) device. This device blows air through a mask when you breathe in (inhale) and breathe out (exhale). Having surgery if other treatments do not work. During surgery, excess tissue is removed to create a wider airway. Follow these instructions at home: Lifestyle Make any lifestyle changes that your health care provider recommends. Eat a healthy, well-balanced diet. Take steps to lose weight if you are overweight. Avoid using depressants, including alcohol, sedatives, and narcotics. Do not use any products that contain nicotine or tobacco. These products include cigarettes, chewing tobacco, and vaping devices, such as e-cigarettes. If you need help quitting, ask your health care provider. General instructions Take over-the-counter and prescription medicines only as told by your health care provider. If you were given a device to open your airway while you sleep, use it only as told by your health care provider. If you are having surgery, make sure to tell your health care provider you have sleep apnea. You may need to bring your device with you. Keep all follow-up visits. This is important. Contact a health care provider if: The device that you received to open your airway during sleep is uncomfortable or does not seem to be working. Your symptoms do not improve. Your symptoms get worse. Get help right away if: You develop: Chest pain. Shortness of breath. Discomfort in your back, arms, or stomach. You have: Trouble speaking. Weakness on one side of your body. Drooping in your face. These symptoms may represent a serious problem that is an emergency. Do not wait to see if the symptoms will go away. Get medical help right away. Call your local emergency services (911 in the [place]  Do not drive yourself to the hospital. Summary Sleep apnea is a condition in which breathing pauses or becomes shallow during sleep. The most common cause is a collapsed or blocked airway. The goal of treatment is to restore normal breathing and to ease symptoms during sleep. This information is not intended to replace advice given to you by your health care provider. Make sure you discuss any questions you have with your health care provider. Document Revised: 10/07/2020 Document Reviewed: 02/07/2020 Elsevier Patient Education  [employer].

## 2022-04-20 NOTE — Assessment & Plan Note (Signed)
-   Patient has symptoms of loud snoring, witnessed apnea and daytime sleepiness.  Epworth score 16.  BMI 39.  I have strong suspicion patient has underlying obstructive sleep apnea, needs in-lab split night sleep study due to history of congestive heart failure and NSTEMI.  We reviewed risks of untreated sleep apnea including cardiac arrhythmias, pulmonary hypertension, diabetes and stroke.  We also discussed treatment options including weight loss, oral appliance, CPAP therapy or referral to ENT for possible surgical options.  Patient is open to CPAP if needed.  Encouraged weight loss efforts.  Advised against driving experiencing excessive daytime sleepiness fatigue.  Patient does not drink alcohol.  Follow-up 1 to 2 weeks after sleep study to review results and treatment options if needed.

## 2022-04-21 NOTE — Progress Notes (Signed)
Reviewed and agree with assessment/plan.   Dynasti Kerman, MD [facility] 04/21/2022, 8:21 AM Pager:  [phone]  

## 2022-04-25 ENCOUNTER — Telehealth: Payer: Self-pay

## 2022-04-25 ENCOUNTER — Other Ambulatory Visit (HOSPITAL_COMMUNITY): Payer: Self-pay

## 2022-04-25 NOTE — Telephone Encounter (Signed)
Pharmacy Patient Advocate Encounter  Prior Authorization for FARXIGA 10 MG  has been approved.    PA# [id number] Effective dates: 04/25/22 through 04/26/23   Received notification from [facility]  that prior authorization for FARXIGA 10 MG  is needed.    PA submitted on 04/25/22 Key [id number] Status is pending  Karie Soda, [facility] Patient Advocate Specialist Direct Number: [phone] Fax: [phone]

## 2022-05-01 ENCOUNTER — Emergency Department (HOSPITAL_BASED_OUTPATIENT_CLINIC_OR_DEPARTMENT_OTHER)
Admission: EM | Admit: 2022-05-01 | Discharge: 2022-05-02 | Disposition: A | Discharge status: Home / Self Care | Payer: Medicaid Other | Attending: Emergency Medicine | Admitting: Emergency Medicine

## 2022-05-01 ENCOUNTER — Encounter (HOSPITAL_BASED_OUTPATIENT_CLINIC_OR_DEPARTMENT_OTHER): Payer: Self-pay

## 2022-05-01 ENCOUNTER — Emergency Department (HOSPITAL_BASED_OUTPATIENT_CLINIC_OR_DEPARTMENT_OTHER): Payer: Medicaid Other | Admitting: Radiology

## 2022-05-01 DIAGNOSIS — R0981 Nasal congestion: Secondary | ICD-10-CM | POA: Diagnosis present

## 2022-05-01 DIAGNOSIS — Z79899 Other long term (current) drug therapy: Secondary | ICD-10-CM | POA: Insufficient documentation

## 2022-05-01 DIAGNOSIS — J9811 Atelectasis: Secondary | ICD-10-CM | POA: Diagnosis not present

## 2022-05-01 DIAGNOSIS — U071 COVID-19: Secondary | ICD-10-CM | POA: Diagnosis not present

## 2022-05-01 DIAGNOSIS — I509 Heart failure, unspecified: Secondary | ICD-10-CM | POA: Diagnosis not present

## 2022-05-01 DIAGNOSIS — Z7982 Long term (current) use of aspirin: Secondary | ICD-10-CM | POA: Diagnosis not present

## 2022-05-01 DIAGNOSIS — I110 Hypertensive heart disease with heart failure: Secondary | ICD-10-CM | POA: Diagnosis not present

## 2022-05-01 DIAGNOSIS — E119 Type 2 diabetes mellitus without complications: Secondary | ICD-10-CM | POA: Insufficient documentation

## 2022-05-01 DIAGNOSIS — R059 Cough, unspecified: Secondary | ICD-10-CM | POA: Diagnosis not present

## 2022-05-01 LAB — RESP PANEL BY RT-PCR (RSV, FLU A&B, COVID)  RVPGX2
Influenza A by PCR: NEGATIVE
Influenza B by PCR: NEGATIVE
Resp Syncytial Virus by PCR: NEGATIVE
SARS Coronavirus 2 by RT PCR: POSITIVE — AB

## 2022-05-01 NOTE — ED Triage Notes (Signed)
Pt states he needs to get a covid test since he's been exposed. Slight cough, congestion starting yesterday

## 2022-05-01 NOTE — ED Provider Notes (Signed)
[facility] Provider Note   CSN: [id number] Arrival date & time: 05/01/22  2122     History {Add pertinent medical, surgical, social history, OB history to HPI:1} No chief complaint on file.   Joseph Ray is a 47 y.o. male.  HPI     Home Medications Prior to Admission medications   Medication Sig Start Date End Date Taking? Authorizing Provider  aspirin EC 81 MG tablet Take 1 tablet (81 mg total) by mouth daily. Swallow whole. 08/17/21   Little Ishikawa, MD  atorvastatin (LIPITOR) 80 MG tablet Take 1 tablet (80 mg total) by mouth daily. 04/11/22   Janina Mayo, MD  carvedilol (COREG) 6.25 MG tablet Take 1 tablet (6.25 mg total) by mouth 2 (two) times daily with a meal. 04/11/22   Horton, Barbette Hair, MD  clopidogrel (PLAVIX) 75 MG tablet Take 1 tablet (75 mg total) by mouth daily. 04/11/22   Janina Mayo, MD  dapagliflozin propanediol (FARXIGA) 10 MG TABS tablet Take 1 tablet (10 mg total) by mouth daily before breakfast. 04/11/22   Janina Mayo, MD  furosemide (LASIX) 40 MG tablet Take 1 tablet (40 mg total) by mouth daily. 04/11/22   Horton, Barbette Hair, MD  hydrALAZINE (APRESOLINE) 25 MG tablet Take 1 tablet (25 mg total) by mouth 3 (three) times daily. 04/11/22   Horton, Barbette Hair, MD  isosorbide dinitrate (ISORDIL) 10 MG tablet Take 1 tablet (10 mg total) by mouth 3 (three) times daily. 04/11/22   Horton, Barbette Hair, MD  nitroGLYCERIN (NITROSTAT) 0.4 MG SL tablet Place 1 tablet (0.4 mg total) under the tongue every 5 (five) minutes as needed. 04/11/22 07/10/22  Janina Mayo, MD  sacubitril-valsartan (ENTRESTO) 24-26 MG Take 1 tablet by mouth 2 (two) times daily. 04/11/22   Merryl Hacker, MD      Allergies    Penicillins    Review of Systems   Review of Systems  Physical Exam Updated Vital Signs There were no vitals taken for this visit. Physical Exam  ED Results / Procedures / Treatments   Labs (all labs ordered  are listed, but only abnormal results are displayed) Labs Reviewed  RESP PANEL BY RT-PCR (RSV, FLU A&B, COVID)  RVPGX2 - Abnormal; Notable for the following components:      Result Value   SARS Coronavirus 2 by RT PCR POSITIVE (*)    All other components within normal limits    EKG None  Radiology No results found.  Procedures Procedures  {Document cardiac monitor, telemetry assessment procedure when appropriate:1}  Medications Ordered in ED Medications - No data to display  ED Course/ Medical Decision Making/ A&P   {   Click here for ABCD2, HEART and other calculatorsREFRESH Note before signing :1}                          Medical Decision Making  ***  {Document critical care time when appropriate:1} {Document review of labs and clinical decision tools ie heart score, Chads2Vasc2 etc:1}  {Document your independent review of radiology images, and any outside records:1} {Document your discussion with family members, caretakers, and with consultants:1} {Document social determinants of health affecting pt's care:1} {Document your decision making why or why not admission, treatments were needed:1} Final Clinical Impression(s) / ED Diagnoses Final diagnoses:  None    Rx / DC Orders ED Discharge Orders     None

## 2022-05-02 ENCOUNTER — Other Ambulatory Visit (HOSPITAL_COMMUNITY): Payer: 59

## 2022-05-02 ENCOUNTER — Other Ambulatory Visit (HOSPITAL_COMMUNITY): Payer: Self-pay

## 2022-05-02 ENCOUNTER — Encounter (HOSPITAL_COMMUNITY): Payer: Self-pay | Admitting: Internal Medicine

## 2022-05-02 LAB — BASIC METABOLIC PANEL
Anion gap: 11 (ref 5–15)
BUN: 12 mg/dL (ref 6–20)
CO2: 24 mmol/L (ref 22–32)
Calcium: 9.2 mg/dL (ref 8.9–10.3)
Chloride: 104 mmol/L (ref 98–111)
Creatinine, Ser: 1.11 mg/dL (ref 0.61–1.24)
GFR, Estimated: >60 mL/min (ref >60)
Glucose, Bld: 98 mg/dL (ref 70–99)
Potassium: 3.5 mmol/L (ref 3.5–5.1)
Sodium: 139 mmol/L (ref 135–145)

## 2022-05-02 MED ORDER — MOLNUPIRAVIR EUA 200MG CAPSULE: 4.0000 | ORAL_CAPSULE | Freq: Two times a day (BID) | ORAL | 0 refills | Status: AC

## 2022-05-02 MED ORDER — MOLNUPIRAVIR EUA 200MG CAPSULE
4.0000 | ORAL_CAPSULE | Freq: Two times a day (BID) | ORAL | 0 refills | Status: DC
  Filled 2022-05-02: qty 40, 5d supply, fill #0

## 2022-05-02 NOTE — Telephone Encounter (Signed)
Needs to contact PCP, we have only see him for sleep consult

## 2022-05-02 NOTE — ED Notes (Signed)
Pt verbalized understanding of d/c instructions, meds, and followup care. Denies questions. VSS, no distress noted. Steady gait to exit with all belongings. 

## 2022-05-02 NOTE — Telephone Encounter (Signed)
Mychart message sent by pt: [facility] (supporting Martyn Ehrich, NP)1 hour ago (7:54 AM)    Good morning, I have come down with Covid. Can I take Paxlovid instead of molnupiravir   Pt was seen at [facility] 2/18 and was prescribed molnupiravir there but pt is now asking for paxlovid instead.  Beth, please advise on this for pt.

## 2022-05-02 NOTE — Discharge Instructions (Addendum)
You were seen here in the ER for evaluation of your cough and cold symptoms after being exposed to [place]. You are COVID positive. You will need to Quarantine through Friday.  I have sent in the medication called molnupiravir which she will take for your COVID.  I have included more of a patient about this medication is the discharge paperwork.  You can also try over-the-counter cough and cold medications to help with your symptoms.  If you have any chest pain, shortness of breath, worsening symptoms, please return to the nurse or department for evaluation.  If you have any concerns, new or worsening symptoms, please return to the nearest emergency room for evaluation.  Get help right away if: You have trouble breathing. You have pain or pressure in your chest. You are confused. You have bluish lips and fingernails. You have trouble waking from sleep. You have symptoms that get worse. These symptoms may be an emergency. Get help right away. Call 911. Do not wait to see if the symptoms will go away. Do not drive yourself to the hospital.

## 2022-05-29 ENCOUNTER — Ambulatory Visit (HOSPITAL_BASED_OUTPATIENT_CLINIC_OR_DEPARTMENT_OTHER): Payer: BC Managed Care – PPO | Attending: Primary Care | Admitting: Pulmonary Disease

## 2022-05-29 DIAGNOSIS — R0683 Snoring: Secondary | ICD-10-CM | POA: Diagnosis not present

## 2022-05-29 DIAGNOSIS — G4736 Sleep related hypoventilation in conditions classified elsewhere: Secondary | ICD-10-CM | POA: Insufficient documentation

## 2022-05-29 DIAGNOSIS — E118 Type 2 diabetes mellitus with unspecified complications: Secondary | ICD-10-CM | POA: Diagnosis not present

## 2022-05-29 DIAGNOSIS — I10 Essential (primary) hypertension: Secondary | ICD-10-CM | POA: Diagnosis not present

## 2022-05-29 DIAGNOSIS — G4733 Obstructive sleep apnea (adult) (pediatric): Secondary | ICD-10-CM | POA: Diagnosis not present

## 2022-05-30 DIAGNOSIS — R0683 Snoring: Secondary | ICD-10-CM | POA: Diagnosis not present

## 2022-05-30 NOTE — Procedures (Signed)
        Patient Name: Joseph Ray, Joseph Ray Date: 05/29/2022  Gender: Male  D.O.B: 07/09/1975  Age (years): 46  Referring Provider: Geraldo Pitter NP  Height (inches): 74  Interpreting Physician: Chesley Mires MD, ABSM  Weight (lbs): 312  RPSGT: Gwenyth Allegra  BMI: 40  MRN: [id number]  Neck Size: 18.00  <br> <br>   CLINICAL INFORMATION Sleep Study Type: NPSG   Indication for sleep study: Diabetes, Hypertension   Epworth Sleepiness Score: 12   SLEEP STUDY TECHNIQUE As per the AASM Manual for the Scoring of Sleep and Associated Events v2.3 (April 2016) with a hypopnea requiring 4% desaturations. The channels recorded and monitored were frontal, central and occipital EEG, electrooculogram (EOG), submentalis EMG (chin), nasal and oral airflow, thoracic and abdominal wall motion, anterior tibialis EMG, snore microphone, electrocardiogram, and pulse oximetry.  MEDICATIONS Medications self-administered by patient taken the night of the study : N/A  SLEEP ARCHITECTURE The study was initiated at 11:13:48 PM and ended at 5:16:25 AM. Sleep onset time was 4.5 minutes and the sleep efficiency was 76.6%. The total sleep time was 277.6 minutes. Stage REM latency was 115.0 minutes. The patient spent 12.6% of the night in stage N1 sleep, 75.7% in stage N2 sleep, 0.0% in stage N3 and 11.7% in REM. Alpha intrusion was absent. Supine sleep was 34.94%.  RESPIRATORY PARAMETERS The overall apnea/hypopnea index (AHI) was 74.8 per hour. There were 74 total apneas, including 74 obstructive, 0 central and 0 mixed apneas. There were 272 hypopneas and 2 RERAs. The AHI during Stage REM sleep was 73.8 per hour. AHI while supine was 79.2 per hour. The mean oxygen saturation was 85.1%. The minimum SpO2 during sleep was 60.0%. Loud snoring was noted during this study.  CARDIAC DATA The 2 lead EKG demonstrated sinus rhythm. The mean heart rate was 81.0 beats per minute. Other EKG findings include:  None.  LEG MOVEMENT DATA The total PLMS were 0 with a resulting PLMS index of 0.0. Associated arousal with leg movement index was 0.0 .  IMPRESSIONS - Severe obstructive sleep apnea occurred during this study (AHI = 74.8/h). - Severe oxygen desaturation was noted during this study (Min O2 = 60.0%). - The patient snored with loud snoring volume. - Split night protocol attempted, but he wasn't able to tolerate wearing a CPAP mask during this study.  DIAGNOSIS - Obstructive Sleep Apnea (G47.33) - Nocturnal Hypoxemia (G47.36)  RECOMMENDATIONS - Given the severity of his sleep apnea and oxygen desaturation PAP therapy is his best option.  He can either be started on auto CPAP 5 - 20 cm H2O or arrange for a full night CPAP titration study.  He will need to have mask desensitization.  He might need to be tried on Bipap if he has trouble tolerating CPAP. - alternative therapies include oral appliance or surgical assessment. - Avoid alcohol, sedatives and other CNS depressants that may worsen sleep apnea and disrupt normal sleep architecture. - Sleep hygiene should be reviewed to assess factors that may improve sleep quality. - Weight management and regular exercise should be initiated or continued if appropriate.    [Electronically signed] 05/30/2022 03:23 PM  Chesley Mires MD, ABSM Diplomate, American Board of Sleep Medicine NPI: [id number]    [place] PH: [phone]   FX: [phone] [place]

## 2022-06-08 ENCOUNTER — Other Ambulatory Visit (HOSPITAL_COMMUNITY): Payer: Self-pay

## 2022-06-08 ENCOUNTER — Telehealth (HOSPITAL_BASED_OUTPATIENT_CLINIC_OR_DEPARTMENT_OTHER): Payer: Self-pay | Admitting: Cardiology

## 2022-06-08 MED ORDER — FUROSEMIDE 40 MG PO TABS: 40.0000 mg | ORAL_TABLET | Freq: Every day | ORAL | 0 refills | Status: DC

## 2022-06-08 MED ORDER — ISOSORBIDE DINITRATE 10 MG PO TABS
10.0000 mg | ORAL_TABLET | Freq: Three times a day (TID) | ORAL | 2 refills | Status: DC
  Filled 2022-06-08: qty 90, 30d supply, fill #0

## 2022-06-08 MED ORDER — CARVEDILOL 6.25 MG PO TABS
6.2500 mg | ORAL_TABLET | Freq: Two times a day (BID) | ORAL | 2 refills | Status: DC
  Filled 2022-06-08: qty 60, 30d supply, fill #0

## 2022-06-08 MED ORDER — CARVEDILOL 6.25 MG PO TABS: 6.2500 mg | ORAL_TABLET | Freq: Two times a day (BID) | ORAL | 2 refills | Status: DC

## 2022-06-08 MED ORDER — FUROSEMIDE 40 MG PO TABS: 40.0000 mg | ORAL_TABLET | Freq: Every day | ORAL | 2 refills | Status: DC

## 2022-06-08 MED ORDER — HYDRALAZINE HCL 25 MG PO TABS: 25.0000 mg | ORAL_TABLET | Freq: Three times a day (TID) | ORAL | 2 refills | Status: DC

## 2022-06-08 MED ORDER — ISOSORBIDE DINITRATE 10 MG PO TABS: 10.0000 mg | ORAL_TABLET | Freq: Three times a day (TID) | ORAL | 2 refills | Status: DC

## 2022-06-08 MED ORDER — DAPAGLIFLOZIN PROPANEDIOL 10 MG PO TABS: 10.0000 mg | ORAL_TABLET | Freq: Every day | ORAL | 2 refills | Status: DC

## 2022-06-08 MED ORDER — ATORVASTATIN CALCIUM 80 MG PO TABS: 80.0000 mg | ORAL_TABLET | Freq: Every day | ORAL | 2 refills | Status: DC

## 2022-06-08 MED ORDER — HYDRALAZINE HCL 25 MG PO TABS
25.0000 mg | ORAL_TABLET | Freq: Three times a day (TID) | ORAL | 2 refills | Status: DC
  Filled 2022-06-08: qty 90, 30d supply, fill #0

## 2022-06-08 MED ORDER — ATORVASTATIN CALCIUM 80 MG PO TABS
80.0000 mg | ORAL_TABLET | Freq: Every day | ORAL | 2 refills | Status: DC
  Filled 2022-06-08: qty 30, 30d supply, fill #0

## 2022-06-08 MED ORDER — DAPAGLIFLOZIN PROPANEDIOL 10 MG PO TABS
10.0000 mg | ORAL_TABLET | Freq: Every day | ORAL | 3 refills | Status: DC
  Filled 2022-06-08: qty 30, 30d supply, fill #0

## 2022-06-08 MED ORDER — FUROSEMIDE 40 MG PO TABS
40.0000 mg | ORAL_TABLET | Freq: Every day | ORAL | 0 refills | Status: DC
  Filled 2022-06-08: qty 30, 30d supply, fill #0

## 2022-06-08 NOTE — Telephone Encounter (Signed)
 *  STAT* If patient is at the pharmacy, call can be transferred to refill team.   1. Which medications need to be refilled? (please list name of each medication and dose if known)   dapagliflozin propanediol (FARXIGA) 10 MG TABS tablet  atorvastatin (LIPITOR) 80 MG tablet  carvedilol (COREG) 6.25 MG tablet  furosemide (LASIX) 40 MG tablet  isosorbide dinitrate (ISORDIL) 10 MG tablet  hydrALAZINE (APRESOLINE) 25 MG tablet   2. Which pharmacy/location (including street and city if local pharmacy) is medication to be sent to?  [id number] - [place], [place] - [address]   3. Do they need a 30 day or 90 day supply?  90 days

## 2022-06-08 NOTE — Addendum Note (Signed)
Addended by: Ernie Hew D on: 06/08/2022 03:17 PM   Modules accepted: Orders

## 2022-06-08 NOTE — Telephone Encounter (Signed)
Rx request sent to pharmacy.  

## 2022-06-09 ENCOUNTER — Other Ambulatory Visit (HOSPITAL_COMMUNITY): Payer: Self-pay

## 2022-06-20 ENCOUNTER — Telehealth: Payer: Self-pay | Admitting: Internal Medicine

## 2022-06-20 NOTE — Telephone Encounter (Signed)
Called to f/u on SOB, no answer, lvm and call back number

## 2022-06-20 NOTE — Telephone Encounter (Signed)
  Per MyChart scheduling message:  Pt c/o Shortness Of Breath: STAT if SOB developed within the last 24 hours or pt is noticeably SOB on the phone  1. Are you currently SOB (can you hear that pt is SOB on the phone)? No  2. How long have you been experiencing SOB? About 2 days  3. Are you SOB when sitting or when up moving around? Mostly at night when laying down  4. Are you currently experiencing any other symptoms? No

## 2022-06-23 NOTE — Telephone Encounter (Signed)
Spoke with patient and states his SOB has improved over the last 2 days. He thought it was weather or as he was congested.  With further discussion he states he does have SOB 2-3 days a week.  Mostly with exertion or when laying down.  Does have some pitting edema in his feet that seems to be about the same times as the SOB.  He takes Lasix 40mg  daily but states he doesn't think it is working as well as it used to.  Advised I would send to provider for review

## 2022-06-24 ENCOUNTER — Other Ambulatory Visit (HOSPITAL_COMMUNITY): Payer: Self-pay

## 2022-06-24 MED ORDER — FUROSEMIDE 40 MG PO TABS
ORAL_TABLET | ORAL | 2 refills | Status: DC
  Filled 2022-06-24: qty 90, 85d supply, fill #0
  Filled 2022-07-28: qty 90, 90d supply, fill #0
  Filled 2022-08-17 – 2022-08-24 (×3): qty 90, 85d supply, fill #0

## 2022-06-24 NOTE — Addendum Note (Signed)
Addended by: Candie Chroman on: 06/24/2022 08:28 AM   Modules accepted: Orders

## 2022-06-24 NOTE — Telephone Encounter (Signed)
Spoke with patient and he states understanding of th increase in medication.  He will keep a daily log of weights and bring to appointment.  He is scheduled for 4/17 at 1:20

## 2022-06-29 ENCOUNTER — Ambulatory Visit: Payer: BC Managed Care – PPO | Attending: Internal Medicine | Admitting: Internal Medicine

## 2022-06-29 VITALS — BP 145/97 | HR 90 | Ht 75.0 in | Wt 314.0 lb

## 2022-06-29 DIAGNOSIS — I5021 Acute systolic (congestive) heart failure: Secondary | ICD-10-CM

## 2022-06-29 NOTE — Progress Notes (Signed)
Cardiology Office Note:    Date:  06/29/2022   ID:  Joseph Ray, DOB December 01, 1975, MRN [id number]  PCP:  Merrilyn Puma, MD   [facility] Providers Cardiologist:  Maisie Fus, MD     Referring MD: Merrilyn Puma, MD   No chief complaint on file. Hospital Follow up  History of Present Illness:    Joseph Ray is a 47 y.o. 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 Bredahl presented with hypertensive emergency and chest pressure. He was noted to have an NSTEMI. His blood pressures were controlled and he went for [facility]. On his cath, he had large thrombus burden, the vessel was tortuous and risk was greater than benefit.  His EF was 30-35% with grade II DD. He was discharged on GDMT.   Initially, he states he walks everyday up to 35 minutes per day. He no longer has chest pressure. He denies orthopnea, PND or LE edema.  At home his Bps are well controlled. He did not take his medication this AM. He also notes that he does not have his CPAP despite diagnosis of sleep apnea.  Interim Hx 04/04/2022 Does not have CPAP; having apnea at night. No orthopnea/PND. No LE edema. No chest pressure. No changes in his family hx.  Blood pressures are normal today 120/90 mmhg. Notes some anxiety with his new job  Interim hx 06/29/2022 He called noted some LE edema on 4/8. He notes orthopnea. He increased his lasix to 80 mg daily from 40 mg daily. He is feeling better. He can lie flat now. He notes he had a few cookouts. He works from home and not active. No chest pain.   Cardiology Studies:  Cardiac Cath 08/16/2021    Dist RCA lesion is 50% stenosed, after more proximal aneurysm.   Mid Cx lesion is 99% stenosed.  Heavy thrombus and an aneurysmal  segment.  Culprit, but not a candudate for PCI.   LV end diastolic pressure is mildly elevated.   There is no aortic valve stenosis.   Severe, right subclavian tortuosity which made torquing catheters somewhat difficult.  The left system was straightforward to engage, but the RCA was more difficult, but ultimately successful.  Could consider destination sheath if using the right radial approach in the future, but this was not needed today.   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  The mid circumflex appears to be the culprit.  This large aneurysmal segment appears to be filled with thrombus.  There is some flow through the circumflex to fill the distal vessel.  There severe retroflexion of the circumflex as well.  I do not think intervention would be possible given the angulation and the large burden of thrombus.  He has not had pain in 2 days.  Would treat with dual antiplatelet therapy and aggressive secondary prevention, along with medical therapy for his heart failure.  No other severe disease noted.   Consider indefinite dual antiplatelet therapy.  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 08/13/2021   Need for assessment for sleep apnea    NSTEMI (non-ST elevated myocardial infarction) [facility])    Prediabetes     Past Surgical History:  Procedure Laterality Date   ACHILLES TENDON SURGERY Left 03/25/2013   Procedure: LEFT ACHILLES TENDON REPAIR;  Surgeon: [name], MD;  Location: [facility];  Service: Orthopedics;  Laterality: Left;   LEFT HEART CATH AND CORONARY ANGIOGRAPHY N/A  08/16/2021   Procedure: LEFT HEART CATH AND CORONARY ANGIOGRAPHY;  Surgeon: [name], MD;  Location: [facility];  Service: Cardiovascular;  Laterality: N/A;    Current Medications: Current Meds  Medication Sig   aspirin EC 81 MG tablet Take 1 tablet (81 mg total) by mouth daily. Swallow whole.   atorvastatin (LIPITOR) 80 MG tablet Take 1 tablet (80 mg total) by mouth daily.   carvedilol (COREG) 6.25 MG tablet Take 1 tablet (6.25 mg total) by mouth 2 (two) times daily with a meal.   clopidogrel (PLAVIX) 75 MG tablet Take 1 tablet (75 mg total) by mouth daily.   dapagliflozin propanediol (FARXIGA) 10 MG TABS tablet Take 1 tablet (10 mg total) by mouth daily before breakfast.   furosemide (LASIX) 40 MG tablet Take 2 tablets (80 mg total) by mouth daily for 5 days, THEN 1 tablet (40 mg total) daily.   hydrALAZINE (APRESOLINE) 25 MG tablet Take 1 tablet (25 mg total) by mouth 3 (three) times daily.   isosorbide dinitrate (ISORDIL) 10 MG tablet Take 1 tablet (10 mg total) by mouth 3 (three) times daily.   nitroGLYCERIN (NITROSTAT) 0.4 MG SL tablet Place 1 tablet (0.4 mg total) under the tongue every 5 (five) minutes as needed.   sacubitril-valsartan (ENTRESTO) 24-26 MG Take 1 tablet by mouth 2 (two) times daily.     Allergies:   Penicillins   Social History   Socioeconomic History   Marital status: Single    Spouse name: Not on file   Number of children: 3   Years of education: Not on file   Highest education level: Not on file  Occupational History   Not on file  Tobacco Use   Smoking status: Former    Packs/day: 0.15    Years: 15.00    Additional pack years: 0.00    Total pack years: 2.25    Types: Cigarettes   Smokeless tobacco: Never   Tobacco comments:    Stopped smoking in May 2023.  Vaping Use   Vaping Use: Never used  Substance and Sexual Activity   Alcohol use: Yes    Comment: social   Drug use: No   Sexual activity: Not on file  Other Topics  Concern   Not on file  Social History Narrative   Not on file   Social Determinants of Health   Financial Resource Strain: Not on file  Food Insecurity: Not on file  Transportation Needs: Not on file  Physical Activity: Not on file  Stress: Not on file  Social Connections: Not on file     Family History: The patient's family history includes Cancer in his paternal aunt; Heart disease in his father and paternal grandfather; Hypertension in his brother, father, mother, and paternal grandfather. Father had MI at 23, hx of CABG  ROS:   Please see the history of present  illness.     All other systems reviewed and are negative.  EKGs/Labs/Other Studies Reviewed:    The following studies were reviewed today:   EKG:  EKG is  ordered today.  The ekg ordered today demonstrates   09/01/2021-NSR, LVH with repol  04/04/2022- NSR, LVH, PACs; inferior q  Recent Labs: 08/13/2021: ALT 23 04/11/2022: B Natriuretic Peptide 114.9; Hemoglobin 14.1; Platelets 231 05/01/2022: BUN 12; Creatinine, Ser 1.11; Potassium 3.5; Sodium 139   Recent Lipid Panel    Component Value Date/Time   CHOL 193 03/12/2020 0455   TRIG 152 (H) 03/12/2020 0455   HDL 26 (L) 03/12/2020 0455   CHOLHDL 7.4 03/12/2020 0455   VLDL 30 03/12/2020 0455   LDLCALC 137 (H) 03/12/2020 0455     Risk Assessment/Calculations:           Physical Exam:    VS:  Vitals:   06/29/22 1322  BP: (!) 145/97  Pulse: 90  SpO2: 96%      Wt Readings from Last 3 Encounters:  06/29/22 (!) 314 lb (142.4 kg)  05/29/22 (!) 312 lb (141.5 kg)  04/20/22 (!) 312 lb 3.2 oz (141.6 kg)     GEN:  Obese. Well nourished, well developed in no acute distress HEENT: Normal NECK: No JVD; No carotid bruits LYMPHATICS: No lymphadenopathy CARDIAC: RRR, no murmurs, rubs, gallops RESPIRATORY:  Clear to auscultation without rales, wheezing or rhonchi  ABDOMEN: Soft, non-tender, non-distended MUSCULOSKELETAL:  No edema; No deformity  SKIN:  Warm and dry NEUROLOGIC:  Alert and oriented x 3 PSYCHIATRIC:  Normal affect   ASSESSMENT:    #Systolic Heart Failure:  NYHA Class II EF 30-35% noted systolic heart failure since 2021, initially thought was non-ischemic,has had poorly controlled HTN in the setting of inconsistent medication related to job loss. He had LHC for NSTEMI 08/16/2021. - stopped lisinopril and started entresto 24/26 mmg BID  - continue coreg 6.25 mg BID - continue imdur 10/hydralazine 25 mg TID - continue farxiga 10 mg daily - can continue lasix 80 mg daily; working well, will continue -->planned to repeat echo 3 months post GDMT; was not done; will try again; If EF not improved on > 3 months of GDMT, will send EP referral for primary prevention ICD  #Ischemic Heart Disease: has known ischemic disease. His left circ. lesion was 99% in the mid segment c/f thrombus. Intervening would have been more risk then benefit. Plan is to continue DAPT at least until June 2024 - continue aspirin 81 mg daily - continue plavix 75 mg daily - continue lipitor 80 mg daily - nitro SL PRN  #HTN: per above. He has a BP cuff at home. Recommend continued monitoring. Notes home Bps are improved.   #OSA: CPAP pending. He plans to call back  #Mild-moderate MR: functional. Plan for medication management.  #Ascending Aortic Aneurysm: yearly assessments for ascending aneurysm 4.3 cm, prior 03/11/2020, CT PE study 03/2022 reported no aneurysm. Will plan for repeat 03/2023. If stable can push out surveillance  PLAN:    In order of problems listed above:   TTE Follow up in 6 months      Medication Adjustments/Labs and Tests Ordered: Current medicines are reviewed at length with the patient today.  Concerns regarding medicines are outlined above.  Orders Placed This Encounter  Procedures   ECHOCARDIOGRAM COMPLETE   No orders of the defined types were placed in this encounter.   Patient Instructions  Medication Instructions:  No  changes *If you need a  refill on your cardiac medications before your next appointment, please call your pharmacy*  Testing/Procedures: Your physician has requested that you have an echocardiogram. Echocardiography is a painless test that uses sound waves to create images of your heart. It provides your doctor with information about the size and shape of your heart and how well your heart's chambers and valves are working. This procedure takes approximately one hour. There are no restrictions for this procedure. Please do NOT wear cologne, perfume, aftershave, or lotions (deodorant is allowed). Please arrive 15 minutes prior to your appointment time.    Follow-Up: At [facility], you and your health needs are our priority.  As part of our continuing mission to provide you with exceptional heart care, we have created designated Provider Care Teams.  These Care Teams include your primary Cardiologist (physician) and Advanced Practice Providers (APPs -  Physician Assistants and Nurse Practitioners) who all work together to provide you with the care you need, when you need it.  We recommend signing up for the patient portal called "MyChart".  Sign up information is provided on this After Visit Summary.  MyChart is used to connect with patients for Virtual Visits (Telemedicine).  Patients are able to view lab/test results, encounter notes, upcoming appointments, etc.  Non-urgent messages can be sent to your provider as well.   To learn more about what you can do with MyChart, go to [email or web address].    Your next appointment:   6 month(s)  Provider:   Maisie Fus, MD       Signed, Maisie Fus, MD  06/29/2022 2:07 PM    [facility]

## 2022-06-29 NOTE — Patient Instructions (Signed)
Medication Instructions:  No changes *If you need a refill on your cardiac medications before your next appointment, please call your pharmacy*  Testing/Procedures: Your physician has requested that you have an echocardiogram. Echocardiography is a painless test that uses sound waves to create images of your heart. It provides your doctor with information about the size and shape of your heart and how well your heart's chambers and valves are working. This procedure takes approximately one hour. There are no restrictions for this procedure. Please do NOT wear cologne, perfume, aftershave, or lotions (deodorant is allowed). Please arrive 15 minutes prior to your appointment time.    Follow-Up: At [facility], you and your health needs are our priority.  As part of our continuing mission to provide you with exceptional heart care, we have created designated Provider Care Teams.  These Care Teams include your primary Cardiologist (physician) and Advanced Practice Providers (APPs -  Physician Assistants and Nurse Practitioners) who all work together to provide you with the care you need, when you need it.  We recommend signing up for the patient portal called "MyChart".  Sign up information is provided on this After Visit Summary.  MyChart is used to connect with patients for Virtual Visits (Telemedicine).  Patients are able to view lab/test results, encounter notes, upcoming appointments, etc.  Non-urgent messages can be sent to your provider as well.   To learn more about what you can do with MyChart, go to [email or web address].    Your next appointment:   6 month(s)  Provider:   Maisie Fus, MD

## 2022-07-15 ENCOUNTER — Other Ambulatory Visit (HOSPITAL_COMMUNITY): Payer: Self-pay

## 2022-07-29 ENCOUNTER — Ambulatory Visit (HOSPITAL_COMMUNITY): Payer: BC Managed Care – PPO | Attending: Internal Medicine

## 2022-07-29 ENCOUNTER — Encounter (HOSPITAL_COMMUNITY): Payer: Self-pay | Admitting: Internal Medicine

## 2022-07-29 ENCOUNTER — Other Ambulatory Visit (HOSPITAL_COMMUNITY): Payer: Self-pay

## 2022-07-29 ENCOUNTER — Other Ambulatory Visit: Payer: Self-pay

## 2022-08-15 ENCOUNTER — Encounter: Payer: Self-pay | Admitting: *Deleted

## 2022-08-17 ENCOUNTER — Other Ambulatory Visit (HOSPITAL_COMMUNITY): Payer: Self-pay

## 2022-08-23 ENCOUNTER — Other Ambulatory Visit: Payer: Self-pay

## 2022-08-24 ENCOUNTER — Other Ambulatory Visit (HOSPITAL_COMMUNITY): Payer: Self-pay

## 2022-09-13 ENCOUNTER — Telehealth (HOSPITAL_BASED_OUTPATIENT_CLINIC_OR_DEPARTMENT_OTHER): Payer: Self-pay | Admitting: Primary Care

## 2022-09-13 DIAGNOSIS — G4730 Sleep apnea, unspecified: Secondary | ICD-10-CM

## 2022-09-16 NOTE — Telephone Encounter (Signed)
Spoke with the pt  He is asking about sleep study results  Sleep study done 05/30/22- was told to call for appt but he never did  He is asking for results now since there are not any upcoming appts open  Beth, please advise, thanks!

## 2022-09-19 NOTE — Telephone Encounter (Signed)
He has severe sleep apnea. Needs to wear CPAP at night. I will put order in for auto CPAP 5-20cm h20. Needs mask desensitization study  Please schedule follow-up in 6-8 weeks

## 2022-09-19 NOTE — Telephone Encounter (Addendum)
Called patient.  Gave all information.  Order for mask desensitization study at [facility] in [facility] ordered.  Information given to patient.  Patient verbalized understanding.   Nothing further needed at this time.

## 2022-09-23 ENCOUNTER — Other Ambulatory Visit: Payer: Self-pay | Admitting: Internal Medicine

## 2022-09-23 MED ORDER — FUROSEMIDE 40 MG PO TABS: 40.0000 mg | ORAL_TABLET | Freq: Every day | ORAL | 3 refills | Status: DC

## 2022-10-02 ENCOUNTER — Other Ambulatory Visit: Payer: Self-pay | Admitting: Internal Medicine

## 2022-10-03 ENCOUNTER — Telehealth: Payer: Self-pay | Admitting: Primary Care

## 2022-10-03 ENCOUNTER — Ambulatory Visit (HOSPITAL_BASED_OUTPATIENT_CLINIC_OR_DEPARTMENT_OTHER): Payer: BC Managed Care – PPO

## 2022-10-03 DIAGNOSIS — G4730 Sleep apnea, unspecified: Secondary | ICD-10-CM

## 2022-10-03 NOTE — Telephone Encounter (Signed)
Patient received an refill on 09/23/22  for furosemide 40 mg daily  90 x3.   This message denied.

## 2022-10-05 NOTE — Telephone Encounter (Signed)
PT calling again about this issue. He is asking for [name] now. Please call to advise.

## 2022-10-05 NOTE — Telephone Encounter (Signed)
I did tell him CPAP was ordered 7/9 and gave him [facility]'s number.

## 2022-10-06 NOTE — Telephone Encounter (Signed)
Spoke with patient regarding prior message. Patient stated he had contact the DME company in regards to CPAP and they told him they did not have a order.Gave patient the correct number to [facility] for patient to contact them. Patient was advised to call our office back after he contacted DME company.  Patient's voice was understanding.

## 2022-10-08 ENCOUNTER — Other Ambulatory Visit (HOSPITAL_BASED_OUTPATIENT_CLINIC_OR_DEPARTMENT_OTHER): Payer: Self-pay | Admitting: Cardiology

## 2022-10-09 ENCOUNTER — Other Ambulatory Visit: Payer: Self-pay

## 2022-10-09 ENCOUNTER — Inpatient Hospital Stay (HOSPITAL_COMMUNITY)
Admission: EM | Admit: 2022-10-09 | Discharge: 2022-10-12 | DRG: 291 | Disposition: A | Discharge status: Home / Self Care | Payer: BC Managed Care – PPO | Attending: Internal Medicine | Admitting: Internal Medicine

## 2022-10-09 ENCOUNTER — Observation Stay (HOSPITAL_COMMUNITY): Payer: BC Managed Care – PPO

## 2022-10-09 ENCOUNTER — Emergency Department (HOSPITAL_COMMUNITY): Payer: BC Managed Care – PPO

## 2022-10-09 ENCOUNTER — Encounter (HOSPITAL_COMMUNITY): Payer: Self-pay

## 2022-10-09 DIAGNOSIS — R000 Tachycardia, unspecified: Secondary | ICD-10-CM | POA: Diagnosis not present

## 2022-10-09 DIAGNOSIS — Z7982 Long term (current) use of aspirin: Secondary | ICD-10-CM

## 2022-10-09 DIAGNOSIS — Z8616 Personal history of COVID-19: Secondary | ICD-10-CM

## 2022-10-09 DIAGNOSIS — I252 Old myocardial infarction: Secondary | ICD-10-CM | POA: Diagnosis not present

## 2022-10-09 DIAGNOSIS — R0989 Other specified symptoms and signs involving the circulatory and respiratory systems: Secondary | ICD-10-CM | POA: Diagnosis not present

## 2022-10-09 DIAGNOSIS — R0689 Other abnormalities of breathing: Secondary | ICD-10-CM | POA: Diagnosis not present

## 2022-10-09 DIAGNOSIS — Z7984 Long term (current) use of oral hypoglycemic drugs: Secondary | ICD-10-CM | POA: Diagnosis not present

## 2022-10-09 DIAGNOSIS — I110 Hypertensive heart disease with heart failure: Secondary | ICD-10-CM | POA: Diagnosis not present

## 2022-10-09 DIAGNOSIS — Z6839 Body mass index (BMI) 39.0-39.9, adult: Secondary | ICD-10-CM

## 2022-10-09 DIAGNOSIS — I2510 Atherosclerotic heart disease of native coronary artery without angina pectoris: Secondary | ICD-10-CM | POA: Diagnosis not present

## 2022-10-09 DIAGNOSIS — R7989 Other specified abnormal findings of blood chemistry: Secondary | ICD-10-CM | POA: Diagnosis not present

## 2022-10-09 DIAGNOSIS — Z8249 Family history of ischemic heart disease and other diseases of the circulatory system: Secondary | ICD-10-CM | POA: Diagnosis not present

## 2022-10-09 DIAGNOSIS — Z91119 Patient's noncompliance with dietary regimen due to unspecified reason: Secondary | ICD-10-CM

## 2022-10-09 DIAGNOSIS — E785 Hyperlipidemia, unspecified: Secondary | ICD-10-CM | POA: Diagnosis not present

## 2022-10-09 DIAGNOSIS — I5021 Acute systolic (congestive) heart failure: Secondary | ICD-10-CM | POA: Diagnosis not present

## 2022-10-09 DIAGNOSIS — R0902 Hypoxemia: Secondary | ICD-10-CM | POA: Diagnosis not present

## 2022-10-09 DIAGNOSIS — R7303 Prediabetes: Secondary | ICD-10-CM | POA: Diagnosis not present

## 2022-10-09 DIAGNOSIS — R0602 Shortness of breath: Secondary | ICD-10-CM | POA: Diagnosis not present

## 2022-10-09 DIAGNOSIS — Z79899 Other long term (current) drug therapy: Secondary | ICD-10-CM

## 2022-10-09 DIAGNOSIS — Z7902 Long term (current) use of antithrombotics/antiplatelets: Secondary | ICD-10-CM | POA: Diagnosis not present

## 2022-10-09 DIAGNOSIS — R0681 Apnea, not elsewhere classified: Secondary | ICD-10-CM | POA: Diagnosis not present

## 2022-10-09 DIAGNOSIS — E669 Obesity, unspecified: Secondary | ICD-10-CM | POA: Diagnosis present

## 2022-10-09 DIAGNOSIS — I5023 Acute on chronic systolic (congestive) heart failure: Secondary | ICD-10-CM | POA: Diagnosis not present

## 2022-10-09 DIAGNOSIS — Z91199 Patient's noncompliance with other medical treatment and regimen due to unspecified reason: Secondary | ICD-10-CM

## 2022-10-09 DIAGNOSIS — I509 Heart failure, unspecified: Principal | ICD-10-CM

## 2022-10-09 DIAGNOSIS — J9601 Acute respiratory failure with hypoxia: Secondary | ICD-10-CM | POA: Diagnosis not present

## 2022-10-09 DIAGNOSIS — I517 Cardiomegaly: Secondary | ICD-10-CM | POA: Diagnosis not present

## 2022-10-09 DIAGNOSIS — F17210 Nicotine dependence, cigarettes, uncomplicated: Secondary | ICD-10-CM | POA: Diagnosis not present

## 2022-10-09 DIAGNOSIS — G4733 Obstructive sleep apnea (adult) (pediatric): Secondary | ICD-10-CM | POA: Diagnosis not present

## 2022-10-09 DIAGNOSIS — I714 Abdominal aortic aneurysm, without rupture, unspecified: Secondary | ICD-10-CM | POA: Diagnosis not present

## 2022-10-09 DIAGNOSIS — Z91148 Patient's other noncompliance with medication regimen for other reason: Secondary | ICD-10-CM

## 2022-10-09 DIAGNOSIS — I7121 Aneurysm of the ascending aorta, without rupture: Secondary | ICD-10-CM | POA: Diagnosis not present

## 2022-10-09 DIAGNOSIS — Z880 Allergy status to penicillin: Secondary | ICD-10-CM

## 2022-10-09 DIAGNOSIS — I5043 Acute on chronic combined systolic (congestive) and diastolic (congestive) heart failure: Secondary | ICD-10-CM | POA: Diagnosis present

## 2022-10-09 DIAGNOSIS — I10 Essential (primary) hypertension: Secondary | ICD-10-CM

## 2022-10-09 DIAGNOSIS — Z87891 Personal history of nicotine dependence: Secondary | ICD-10-CM | POA: Diagnosis not present

## 2022-10-09 LAB — COMPREHENSIVE METABOLIC PANEL
ALT: 34 U/L (ref 0–44)
AST: 33 U/L (ref 15–41)
Albumin: 3.2 g/dL — ABNORMAL LOW (ref 3.5–5.0)
Alkaline Phosphatase: 38 U/L (ref 38–126)
Anion gap: 9 (ref 5–15)
BUN: 12 mg/dL (ref 6–20)
CO2: 21 mmol/L — ABNORMAL LOW (ref 22–32)
Calcium: 8 mg/dL — ABNORMAL LOW (ref 8.9–10.3)
Chloride: 108 mmol/L (ref 98–111)
Creatinine, Ser: 1.04 mg/dL (ref 0.61–1.24)
GFR, Estimated: >60 mL/min (ref >60)
Glucose, Bld: 161 mg/dL — ABNORMAL HIGH (ref 70–99)
Potassium: 3.5 mmol/L (ref 3.5–5.1)
Sodium: 138 mmol/L (ref 135–145)
Total Bilirubin: 0.5 mg/dL (ref 0.3–1.2)
Total Protein: 6.3 g/dL — ABNORMAL LOW (ref 6.5–8.1)

## 2022-10-09 LAB — CBC
HCT: 36.4 % — ABNORMAL LOW (ref 39.0–52.0)
Hemoglobin: 12 g/dL — ABNORMAL LOW (ref 13.0–17.0)
MCH: 30.6 pg (ref 26.0–34.0)
MCHC: 33 g/dL (ref 30.0–36.0)
MCV: 92.9 fL (ref 80.0–100.0)
Platelets: 187 10*3/uL (ref 150–400)
RBC: 3.92 MIL/uL — ABNORMAL LOW (ref 4.22–5.81)
RDW: 14 % (ref 11.5–15.5)
WBC: 6 10*3/uL (ref 4.0–10.5)
nRBC: 0 % (ref 0.0–0.2)

## 2022-10-09 LAB — ECHOCARDIOGRAM COMPLETE
Height: 75 in
MV M vel: 4.9 m/s
MV Peak grad: 96 mmHg
S' Lateral: 6 cm
Single Plane A2C EF: 21.3 %
Weight: 5024 oz

## 2022-10-09 LAB — MAGNESIUM: Magnesium: 1.8 mg/dL (ref 1.7–2.4)

## 2022-10-09 LAB — BRAIN NATRIURETIC PEPTIDE: B Natriuretic Peptide: 218.8 pg/mL — ABNORMAL HIGH (ref 0.0–100.0)

## 2022-10-09 LAB — D-DIMER, QUANTITATIVE: D-Dimer, Quant: 2.53 ug/mL-FEU — ABNORMAL HIGH (ref 0.00–0.50)

## 2022-10-09 LAB — TROPONIN I (HIGH SENSITIVITY)
Troponin I (High Sensitivity): 28 ng/L — ABNORMAL HIGH (ref <18)
Troponin I (High Sensitivity): 32 ng/L — ABNORMAL HIGH (ref <18)

## 2022-10-09 MED ORDER — HYDRALAZINE HCL 25 MG PO TABS: 25.0000 mg | ORAL_TABLET | Freq: Three times a day (TID) | ORAL | Status: DC

## 2022-10-09 MED ORDER — ISOSORBIDE DINITRATE 10 MG PO TABS
10.0000 mg | ORAL_TABLET | Freq: Three times a day (TID) | ORAL | Status: DC
  Administered 2022-10-09 – 2022-10-12 (×9): 10 mg via ORAL
  Filled 2022-10-09 (×9): qty 1

## 2022-10-09 MED ORDER — CARVEDILOL 6.25 MG PO TABS
6.2500 mg | ORAL_TABLET | Freq: Two times a day (BID) | ORAL | Status: DC
  Administered 2022-10-09 – 2022-10-12 (×6): 6.25 mg via ORAL
  Filled 2022-10-09 (×6): qty 1

## 2022-10-09 MED ORDER — FUROSEMIDE 10 MG/ML IJ SOLN
40.0000 mg | Freq: Two times a day (BID) | INTRAMUSCULAR | Status: DC
  Administered 2022-10-09 – 2022-10-11 (×4): 40 mg via INTRAVENOUS
  Filled 2022-10-09 (×4): qty 4

## 2022-10-09 MED ORDER — FUROSEMIDE 10 MG/ML IJ SOLN
60.0000 mg | Freq: Once | INTRAMUSCULAR | Status: AC
  Administered 2022-10-09: 60 mg via INTRAVENOUS
  Filled 2022-10-09: qty 6

## 2022-10-09 MED ORDER — IOHEXOL 350 MG/ML SOLN
75.0000 mL | Freq: Once | INTRAVENOUS | Status: AC | PRN
  Administered 2022-10-09: 75 mL via INTRAVENOUS

## 2022-10-09 MED ORDER — ATORVASTATIN CALCIUM 80 MG PO TABS
80.0000 mg | ORAL_TABLET | Freq: Every day | ORAL | Status: DC
  Administered 2022-10-10 – 2022-10-12 (×3): 80 mg via ORAL
  Filled 2022-10-09: qty 1
  Filled 2022-10-09: qty 2
  Filled 2022-10-09 (×2): qty 1

## 2022-10-09 MED ORDER — MAGNESIUM SULFATE 2 GM/50ML IV SOLN
2.0000 g | Freq: Once | INTRAVENOUS | Status: AC
  Administered 2022-10-10: 2 g via INTRAVENOUS
  Filled 2022-10-09: qty 50

## 2022-10-09 MED ORDER — ACETAMINOPHEN 325 MG PO TABS: 650.0000 mg | ORAL_TABLET | Freq: Four times a day (QID) | ORAL | Status: DC | PRN

## 2022-10-09 MED ORDER — ENOXAPARIN SODIUM 80 MG/0.8ML IJ SOSY
70.0000 mg | PREFILLED_SYRINGE | Freq: Every day | INTRAMUSCULAR | Status: DC
  Administered 2022-10-09 – 2022-10-12 (×4): 70 mg via SUBCUTANEOUS
  Filled 2022-10-09 (×2): qty 0.8
  Filled 2022-10-09 (×2): qty 0.7
  Filled 2022-10-09: qty 0.8

## 2022-10-09 MED ORDER — SACUBITRIL-VALSARTAN 24-26 MG PO TABS
1.0000 | ORAL_TABLET | Freq: Two times a day (BID) | ORAL | Status: DC
  Administered 2022-10-09 – 2022-10-12 (×6): 1 via ORAL
  Filled 2022-10-09 (×6): qty 1

## 2022-10-09 MED ORDER — DAPAGLIFLOZIN PROPANEDIOL 10 MG PO TABS
10.0000 mg | ORAL_TABLET | Freq: Every day | ORAL | Status: DC
  Administered 2022-10-10 – 2022-10-12 (×3): 10 mg via ORAL
  Filled 2022-10-09 (×3): qty 1

## 2022-10-09 MED ORDER — ACETAMINOPHEN 650 MG RE SUPP: 650.0000 mg | Freq: Four times a day (QID) | RECTAL | Status: DC | PRN

## 2022-10-09 MED ORDER — ASPIRIN 81 MG PO TBEC
81.0000 mg | DELAYED_RELEASE_TABLET | Freq: Every day | ORAL | Status: DC
  Administered 2022-10-10 – 2022-10-12 (×3): 81 mg via ORAL
  Filled 2022-10-09 (×4): qty 1

## 2022-10-09 MED ORDER — MAGNESIUM SULFATE 2 GM/50ML IV SOLN
2.0000 g | Freq: Once | INTRAVENOUS | Status: DC
  Filled 2022-10-09: qty 50

## 2022-10-09 NOTE — ED Notes (Signed)
Patient to ECHO.

## 2022-10-09 NOTE — ED Provider Notes (Signed)
[name]   CSN: [id number] Arrival date & time: 10/09/22  4098     History {Add pertinent medical, surgical, social history, OB history to HPI:1} Chief Complaint  Patient presents with   SOB    Joseph Ray is a 47 y.o. male.  HPI 47 year old male presents today complaining of dyspnea and chest heaviness.  He reports that he has had some increased dyspnea over the past several days.  He ran out of his Lasix with the last dose on Thursday.  During the night he became more short of breath.  He attempted to take an Benedetto Goad to the ED but was too short of breath.  EMS was called.  On EMS arrival he was tachypneic with up to 50 respirations per minute.  He is feeling tightness in his upper chest.  Lung sounds are reported to be clear with systolic blood pressure 150.  They noted initial oxygen saturations at 96% but reports that they dropped into the 80s.  He was more short of breath at that time and oxygen was initiated at 2 L/min with increased sats to 96%.  They report that they gave him nitroglycerin and route due to just chest discomfort.  He has previous history of NSTEMI and CHF with congestive heart failure.     Home Medications Prior to Admission medications   Medication Sig Start Date End Date Taking? Authorizing Provider  aspirin EC 81 MG tablet Take 1 tablet (81 mg total) by mouth daily. Swallow whole. 08/17/21   Azucena Fallen, MD  atorvastatin (LIPITOR) 80 MG tablet Take 1 tablet (80 mg total) by mouth daily. 06/08/22   Jodelle Red, MD  carvedilol (COREG) 6.25 MG tablet Take 1 tablet (6.25 mg total) by mouth 2 (two) times daily with a meal. 06/08/22   Jodelle Red, MD  clopidogrel (PLAVIX) 75 MG tablet Take 1 tablet (75 mg total) by mouth daily. 04/11/22   Maisie Fus, MD  dapagliflozin propanediol (FARXIGA) 10 MG TABS tablet Take 1 tablet (10 mg total) by mouth daily before breakfast. 06/08/22    Jodelle Red, MD  furosemide (LASIX) 40 MG tablet Take 2 tablets (80 mg total) by mouth daily for 5 days, THEN 1 tablet (40 mg total) daily. 06/24/22 09/16/22  Maisie Fus, MD  furosemide (LASIX) 40 MG tablet Take 1 tablet (40 mg total) by mouth daily. 09/23/22 12/22/22  Maisie Fus, MD  hydrALAZINE (APRESOLINE) 25 MG tablet Take 1 tablet (25 mg total) by mouth 3 (three) times daily. 06/08/22   Jodelle Red, MD  isosorbide dinitrate (ISORDIL) 10 MG tablet Take 1 tablet (10 mg total) by mouth 3 (three) times daily. 06/08/22   Jodelle Red, MD  nitroGLYCERIN (NITROSTAT) 0.4 MG SL tablet Place 1 tablet (0.4 mg total) under the tongue every 5 (five) minutes as needed. 04/11/22 07/10/22  Maisie Fus, MD  sacubitril-valsartan (ENTRESTO) 24-26 MG Take 1 tablet by mouth 2 (two) times daily. 04/11/22   Horton, Mayer Masker, MD      Allergies    Penicillins    Review of Systems   Review of Systems  Physical Exam Updated Vital Signs BP (!) 139/116   Pulse (!) 109   Temp 98.5 F (36.9 C) (Oral)   Resp (!) 25   Ht 1.905 m (6\' 3" )   Wt (!) 142.4 kg   SpO2 97%   BMI 39.25 kg/m  Physical Exam  ED Results / Procedures /  Treatments   Labs (all labs ordered are listed, but only abnormal results are displayed) Labs Reviewed  CBC - Abnormal; Notable for the following components:      Result Value   RBC 3.92 (*)    Hemoglobin 12.0 (*)    HCT 36.4 (*)    All other components within normal limits  COMPREHENSIVE METABOLIC PANEL - Abnormal; Notable for the following components:   CO2 21 (*)    Glucose, Bld 161 (*)    Calcium 8.0 (*)    Total Protein 6.3 (*)    Albumin 3.2 (*)    All other components within normal limits  BRAIN NATRIURETIC PEPTIDE - Abnormal; Notable for the following components:   B Natriuretic Peptide 218.8 (*)    All other components within normal limits  TROPONIN I (HIGH SENSITIVITY) - Abnormal; Notable for the following components:   Troponin I  (High Sensitivity) 28 (*)    All other components within normal limits  MAGNESIUM  TROPONIN I (HIGH SENSITIVITY)    EKG EKG Interpretation Date/Time:  Sunday October 09 2022 08:07:45 EDT Ventricular Rate:  97 PR Interval:  191 QRS Duration:  112 QT Interval:  396 QTC Calculation: 504 R Axis:   -36  Text Interpretation: Sinus rhythm LAE, consider biatrial enlargement Incomplete left bundle branch block Left ventricular hypertrophy No significant change since last tracing  of 11 April 2022 Confirmed by Margarita Grizzle [phone]) on 10/09/2022 8:22:18 AM  Radiology DG Chest Port 1 View  Result Date: 10/09/2022 CLINICAL DATA:  47 year old male with shortness of breath. EXAM: PORTABLE CHEST 1 VIEW COMPARISON:  Chest radiographs 05/01/2022 and earlier. FINDINGS: Portable AP view at 0838 hours. Cardiomegaly, confirmed by CTA in January this year. Stable cardiac size and mediastinal contours. Mildly lower lung volumes. Visualized tracheal air column is within normal limits. Allowing for portable technique the lungs are clear. No pneumothorax or pleural effusion. No acute osseous abnormality identified. IMPRESSION: Stable cardiomegaly. No acute cardiopulmonary abnormality. Electronically Signed   By: Odessa Fleming M.D.   On: 10/09/2022 08:58    Procedures Procedures  {Document cardiac monitor, telemetry assessment procedure when appropriate:1}  Medications Ordered in ED Medications  furosemide (LASIX) injection 60 mg (60 mg Intravenous Given 10/09/22 5784)    ED Course/ Medical Decision Making/ A&P   {   Click here for ABCD2, HEART and other calculatorsREFRESH Note before signing :1}                          Medical Decision Making Amount and/or Complexity of Data Reviewed Labs: ordered. Radiology: ordered.  Risk Prescription drug management.   Reviewed last cardiology note from June 29, 2022 Last cardiac catheterization 08/16/2021 with distal RCA lesion 50% Mid circumflex lesion 99% stenosed  with heavy thrombus and aneurysmal segment reported as culprit but not candidate for PCI-they noted they would continue aspirin, Plavix, Lipitor, and sublingual nitro Via right subclavian tortuosity   47 year old male with known coronary artery disease presents today with dyspnea and chest heaviness. This mildly tachycardic blood pressure was initially elevated but is now 139/110 without acute intervention Patient has new oxygen requirement Differential diagnosis includes but is not limited to acute coronary syndrome, acute congestive heart failure, PE, other diseases of the great vessels, lung infection including pneumonia Patient evaluated here with EKG, labs, chest x-Porchea Charrier 1-Acute on chronic congestive heart failure-patient with normal BNP, cardiomegaly on chest x-Genea Rheaume but not acute overt edema noted on plain chest x-Timothee Gali.  Patient has been out of Lasix for several days which could potentially contribute to this etiology 2 NSTEMI patient with known history of coronary artery clot that was not amenable to intervention.  He has both chest heaviness and dyspnea that started about the same time.  EKG without acute ST elevation.  Initial troponin is 28 and repeat is pending.  Patient has some relief of symptoms with nitroglycerin.  Discussed with Dr. Anne Fu who will see in consultation.  Agrees with lasix.  No heparin at this time  {Document critical care time when appropriate:1} {Document review of labs and clinical decision tools ie heart score, Chads2Vasc2 etc:1}  {Document your independent review of radiology images, and any outside records:1} {Document your discussion with family members, caretakers, and with consultants:1} {Document social determinants of health affecting pt's care:1} {Document your decision making why or why not admission, treatments were needed:1} Final Clinical Impression(s) / ED Diagnoses Final diagnoses:  None    Rx / DC Orders ED Discharge Orders     None

## 2022-10-09 NOTE — Consult Note (Addendum)
Cardiology Consultation   Patient ID: Joseph Ray MRN: [id number]; DOB: 1976-02-04  Admit date: 10/09/2022 Date of Consult: 10/09/2022  PCP:  Annett Fabian, MD   [facility] Providers Cardiologist:  Maisie Fus, MD        Patient Profile:   Joseph Ray is a 47 y.o. male with a hx of chronic combined CHF, HTN, PRE-DM, NSTEMI 2023 w/ med Rx, obesity, OSA, who is being seen 10/09/2022 for the evaluation of CHF at the request of Dr Rosalia Hammers.  History of Present Illness:   Joseph Ray has noticed increasing shortness of breath for a while.  Dr. Wyline Mood was aware that he had increased his Lasix dose from one 20 mg tablet daily to two 20 mg tablets daily.  However, because of doing that, he had run out early.  He was trying to get his Lasix refilled and there was some kind of an error message.  He had been out of his Lasix since Thursday, but says he has the 20 mg tablets waiting for him at the pharmacy today.  He also admits to dietary indiscretion in the last couple of days since being out of his Lasix.  He has been having problems with his CPAP, but the description sounds more like orthopnea and PND.  He has mild lower extremity edema, but says his abdomen has been distended and very firm.  He says that he has not been out of his other medications and has been taking them consistently.  His weight has not changed significantly in 2024, but is up 6 kg from 2023.  He also says his blood pressure has been running higher and has been harder to control.   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 08/13/2021   Need for assessment for sleep apnea    NSTEMI (non-ST elevated myocardial infarction) [facility])    Prediabetes     Past Surgical History:  Procedure  Laterality Date   ACHILLES TENDON SURGERY Left 03/25/2013   Procedure: LEFT ACHILLES TENDON REPAIR;  Surgeon: [name], MD;  Location: [facility];  Service: Orthopedics;  Laterality: Left;   LEFT HEART CATH AND CORONARY ANGIOGRAPHY N/A 08/16/2021   Procedure: LEFT HEART CATH AND CORONARY ANGIOGRAPHY;  Surgeon: [name], MD;  Location: [facility] INVASIVE CV LAB;  Service: Cardiovascular;  Laterality: N/A;     Home Medications:  Prior to Admission medications   Medication Sig Start Date End Date Taking? Authorizing Provider  aspirin EC 81 MG tablet Take 1 tablet (81 mg total) by mouth daily. Swallow whole. 08/17/21   Azucena Fallen, MD  atorvastatin (LIPITOR) 80 MG tablet Take 1 tablet (80 mg total) by mouth daily. 06/08/22   Jodelle Red, MD  carvedilol (COREG) 6.25 MG tablet Take 1 tablet (6.25 mg total) by mouth 2 (two) times daily with a meal. 06/08/22   Jodelle Red, MD  clopidogrel (PLAVIX) 75 MG tablet Take 1 tablet (75 mg total) by mouth daily. 04/11/22   Maisie Fus, MD  dapagliflozin propanediol (FARXIGA) 10 MG TABS tablet Take 1 tablet (10 mg total) by mouth daily before breakfast. 06/08/22   Jodelle Red, MD  furosemide (LASIX) 40 MG tablet Take 2 tablets (80 mg total)  by mouth daily for 5 days, THEN 1 tablet (40 mg total) daily. 06/24/22 09/16/22  Maisie Fus, MD  furosemide (LASIX) 40 MG tablet Take 1 tablet (40 mg total) by mouth daily. 09/23/22 12/22/22  Maisie Fus, MD  hydrALAZINE (APRESOLINE) 25 MG tablet Take 1 tablet (25 mg total) by mouth 3 (three) times daily. 06/08/22   Jodelle Red, MD  isosorbide dinitrate (ISORDIL) 10 MG tablet Take 1 tablet (10 mg total) by mouth 3 (three) times daily. 06/08/22   Jodelle Red, MD  nitroGLYCERIN (NITROSTAT) 0.4 MG SL tablet Place 1 tablet (0.4 mg total) under the tongue every 5 (five) minutes as needed. 04/11/22 07/10/22  Maisie Fus, MD  sacubitril-valsartan  (ENTRESTO) 24-26 MG Take 1 tablet by mouth 2 (two) times daily. 04/11/22   Horton, Mayer Masker, MD    Inpatient Medications: Scheduled Meds:  Continuous Infusions:  PRN Meds:   Allergies:    Allergies  Allergen Reactions   Penicillins Rash        Social History:   Social History   Socioeconomic History   Marital status: Single    Spouse name: Not on file   Number of children: 3   Years of education: Not on file   Highest education level: Not on file  Occupational History   Not on file  Tobacco Use   Smoking status: Former    Current packs/day: 0.15    Average packs/day: 0.2 packs/day for 15.0 years (2.3 ttl pk-yrs)    Types: Cigarettes   Smokeless tobacco: Never   Tobacco comments:    Stopped smoking in May 2023.  Vaping Use   Vaping status: Never Used  Substance and Sexual Activity   Alcohol use: Yes    Comment: social   Drug use: No   Sexual activity: Not on file  Other Topics Concern   Not on file  Social History Narrative   Not on file   Social Determinants of Health   Financial Resource Strain: Not on file  Food Insecurity: No Food Insecurity (08/11/2020)   Received from [facility]   Hunger Vital Sign    Worried About Running Out of Food in the Last Year: Never true    Ran Out of Food in the Last Year: Never true  Transportation Needs: Not on file  Physical Activity: Not on file  Stress: Not on file  Social Connections: Unknown (07/16/2021)   Received from [facility]   Social Network    Social Network: Not on file  Intimate Partner Violence: Unknown (06/14/2021)   Received from [facility]   HITS    Physically Hurt: Not on file    Insult or Talk Down To: Not on file    Threaten Physical Harm: Not on file    Scream or Curse: Not on file    Family History:   Family History  Problem Relation Age of Onset   Hypertension Mother    Heart disease Father    Hypertension Father    Hypertension Brother    Cancer Paternal Aunt    Hypertension  Paternal Grandfather    Heart disease Paternal Grandfather      ROS:  Please see the history of present illness.  All other ROS reviewed and negative.     Physical Exam/Data:   Vitals:   10/09/22 0900 10/09/22 0930 10/09/22 1000 10/09/22 1021  BP: (!) 139/116 (!) 123/107 (!) 123/107 (!) 138/106  Pulse: (!) 109 98 86 94  Resp: (!) 25 (!)  26 19 (!) 24  Temp:    98.3 F (36.8 C)  TempSrc:    Oral  SpO2: 97% 96% 93% 98%  Weight:      Height:        Intake/Output Summary (Last 24 hours) at 10/09/2022 1049 Last data filed at 10/09/2022 1016 Gross per 24 hour  Intake --  Output 1875 ml  Net -1875 ml      10/09/2022    8:08 AM 06/29/2022    1:22 PM 05/29/2022    8:19 PM  Last 3 Weights  Weight (lbs) 314 lb 314 lb 312 lb  Weight (kg) 142.429 kg 142.429 kg 141.522 kg     Body mass index is 39.25 kg/m.  General:  Well nourished, well developed, obese male in mild resp distress HEENT: normal Neck: some JVD seen but difficult to assess secondary to body habitus Vascular: No carotid bruits; Distal pulses 2+ bilaterally Cardiac:  normal S1, S2; RRR; no murmur  Lungs: Decreased breath sounds bases bilaterally, no wheezing, rhonchi, few Rales Abd: soft, nontender, no hepatomegaly  Ext: Trace-1+ lower extremity edema Musculoskeletal:  No deformities, BUE and BLE strength normal and equal Skin: warm and dry  Neuro:  CNs 2-12 intact, no focal abnormalities noted Psych:  Normal affect   EKG:  The EKG was personally reviewed and demonstrates: Sinus rhythm, heart rate 97, LAE and LVH, no significant change Telemetry:  Telemetry was personally reviewed and demonstrates: Sinus rhythm  Relevant CV Studies:  ECHO: 08/14/2021  1. Left ventricular ejection fraction, by estimation, is 30 to 35%. The left ventricle has moderately decreased function. The left ventricle demonstrates global hypokinesis. There is moderate left ventricular  hypertrophy. Left ventricular diastolic  parameters are  consistent with Grade II diastolic dysfunction (pseudonormalization).   2. Right ventricular systolic function is normal. The right ventricular size is normal. Tricuspid regurgitation signal is inadequate for assessing PA pressure.   3. Functional MR. Mild to moderate mitral valve regurgitation.   4. Aortic valve regurgitation is not visualized.   5. Aortic small ascending aortic aneurysm 4.3 cm.   6. The inferior vena cava is normal in size with greater than 50% respiratory variability, suggesting right atrial pressure of 3 mmHg.  -Comparison(s): No significant change from prior study.    CARDIAC CATH: 08/16/2021    Dist RCA lesion is 50% stenosed, after more proximal aneurysm.   Mid Cx lesion is 99% stenosed.  Heavy thrombus and an aneurysmal segment.  Culprit, but not a candudate for PCI.   LV end diastolic pressure is mildly elevated.   There is no aortic valve stenosis.   Severe, right subclavian tortuosity which made torquing catheters somewhat difficult.  The left system was straightforward to engage, but the RCA was more difficult, but ultimately successful.  Could consider destination sheath if using the right radial approach in the future, but this was not needed today.   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  The mid circumflex appears to be the culprit.  This large aneurysmal segment appears to be filled with thrombus.  There is some flow through the circumflex to fill the distal vessel.  There severe retroflexion of the circumflex as well.  I do not think intervention would be possible given the angulation and the large burden of thrombus.  He has not had pain in 2 days.  Would treat with dual antiplatelet therapy and aggressive secondary prevention, along with medical therapy  for his heart failure.  No other severe disease noted.   Consider indefinite dual antiplatelet therapy. Diagnostic Dominance: Right   Laboratory  Data:  High Sensitivity Troponin:   Recent Labs  Lab 10/09/22 0757  TROPONINIHS 28*     Chemistry Recent Labs  Lab 10/09/22 0757  NA 138  K 3.5  CL 108  CO2 21*  GLUCOSE 161*  BUN 12  CREATININE 1.04  CALCIUM 8.0*  MG 1.8  GFRNONAA >60  ANIONGAP 9    Recent Labs  Lab 10/09/22 0757  PROT 6.3*  ALBUMIN 3.2*  AST 33  ALT 34  ALKPHOS 38  BILITOT 0.5   Lipids No results for input(s): "CHOL", "TRIG", "HDL", "LABVLDL", "LDLCALC", "CHOLHDL" in the last 168 hours.  Hematology Recent Labs  Lab 10/09/22 0757  WBC 6.0  RBC 3.92*  HGB 12.0*  HCT 36.4*  MCV 92.9  MCH 30.6  MCHC 33.0  RDW 14.0  PLT 187   Thyroid No results for input(s): "TSH", "FREET4" in the last 168 hours.  BNP Recent Labs  Lab 10/09/22 0757  BNP 218.8*    DDimer No results for input(s): "DDIMER" in the last 168 hours.   Radiology/Studies:  DG Chest Port 1 View  Result Date: 10/09/2022 CLINICAL DATA:  47 year old male with shortness of breath. EXAM: PORTABLE CHEST 1 VIEW COMPARISON:  Chest radiographs 05/01/2022 and earlier. FINDINGS: Portable AP view at 0838 hours. Cardiomegaly, confirmed by CTA in January this year. Stable cardiac size and mediastinal contours. Mildly lower lung volumes. Visualized tracheal air column is within normal limits. Allowing for portable technique the lungs are clear. No pneumothorax or pleural effusion. No acute osseous abnormality identified. IMPRESSION: Stable cardiomegaly. No acute cardiopulmonary abnormality. Electronically Signed   By: Odessa Fleming M.D.   On: 10/09/2022 08:58     Assessment and Plan:   Acute on chronic combined systolic CHF -Give Lasix 40 mg IV twice daily -At discharge increase home Lasix dose, final dosing TBD -Repeat echo -Continue CHF meds, electrolytes and renal function are normal -Follow BMET daily  2.  Troponin elevation -History of CAD, treated medically. -No recent history of ischemic symptoms -Believe troponin elevation secondary  to CHF, no additional ischemic evaluation indicated  3.  Hypertension -Continue home CHF medications with dose increases as blood pressure permits once his volume status is improved   Risk Assessment/Risk Scores:        New York Heart Association (NYHA) Functional Class NYHA Class IV  For questions or updates, please contact [facility] Please consult [email or web address] for contact info under    Signed, Theodore Demark, PA-C  10/09/2022 10:49 AM  I have seen and examined this patient with Theodore Demark.  Agree with above, note added to reflect my findings.  Patient with a past history as above.  He presents to the hospital with increasing shortness of breath.  Patient has been out of some of his heart failure medications as there was some sort of air in receiving them from the pharmacy.  He admits to dietary discretion over the last few days, eating more salty foods.  He presents with increasing shortness of breath.  He has been diuresed in the emergency room.  He is feeling improved.  GEN: Well nourished, well developed, in no acute distress  HEENT: normal  Neck: no JVD, carotid bruits, or masses Cardiac: RRR; no murmurs, rubs, or gallops,no edema  Respiratory:  clear to auscultation bilaterally, normal work of breathing GI: soft, nontender, nondistended, +  BS MS: no deformity or atrophy  Skin: warm and dry Neuro:  Strength and sensation are intact Psych: euthymic mood, full affect   Acute on chronic systolic heart failure: Likely due to dietary discretion and not being on his heart failure medications.  Agree with continued diuresis with IV Lasix. Coronary artery disease: Troponin elevation.  Likely due to demand ischemia.  No plans for ischemic evaluation.  Continue antiplatelets. Hypertension: Continue home medications.  [place]h Yeagley M. Gladys Gutman MD 10/09/2022 11:38 AM

## 2022-10-09 NOTE — ED Notes (Signed)
ED TO INPATIENT HANDOFF REPORT  ED Nurse Name and Phone #: Lovette Cliche [phone]  S Name/Age/Gender Joseph Ray 47 y.o. male Room/Bed: 036C/036C  Code Status   Code Status: Full Code  Home/SNF/Other Home Patient oriented to: self, place, time, and situation Is this baseline? Yes   Triage Complete: Triage complete  Chief Complaint Acute exacerbation of CHF (congestive heart failure) (HCC) [I50.9]  Triage Note Pt BIB [facility] from home d/t SOB from not having his lasix since Thursday (10/06/22), usually took two 20 mg tabs daily. Pt attempted to take an uber to ED & was too SOB to tol the ride here so he stayed home & called 911. EMS reports the initially noted his tachypnea & was 50 resp/min. He was feeling tight in the upper chest, LS were clear & SBP was 150, with RA at 96%. Once in their truck he was very SOB & RA sat dropped to the 80's & his saturation was maintained with 2L via n/c while in route to ED & was 96% upon arrival on RA. While en route he was given Nitrox3 since his chest discomfort maintained & his BP went to 230/110, after that his SBP was 144/103 upon arrival. Does not wear home O2 & Hx of CHF & sleep apnea (per pt).   Allergies   Level of Care/Admitting Diagnosis ED Disposition     ED Disposition  Admit   Condition  --   Comment  Hospital Area: [facility] [[id number]]  Level of Care: Telemetry Medical [104]  May place patient in observation at [facility] or [facility] if equivalent level of care is available:: No  Covid Evaluation: Asymptomatic - no recent exposure (last 10 days) testing not required  Diagnosis: Acute exacerbation of CHF (congestive heart failure) [facility]) [[id number]]  Admitting Physician: Mercie Eon [[id number]]  Attending Physician: Mercie Eon [[id number]]          B Medical/Surgery History Past Medical History:  Diagnosis Date   Achilles tendon tear    left   Combined congestive systolic and diastolic heart failure  (HCC)    COVID-19 virus detected 03/11/2020   Hypertension    Malignant HTN with heart disease, w/o CHF, 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 08/13/2021   Need for assessment for sleep apnea    NSTEMI (non-ST elevated myocardial infarction) (HCC)    Prediabetes    Past Surgical History:  Procedure Laterality Date   ACHILLES TENDON SURGERY Left 03/25/2013   Procedure: LEFT ACHILLES TENDON REPAIR;  Surgeon: [name], MD;  Location: [facility];  Service: Orthopedics;  Laterality: Left;   LEFT HEART CATH AND CORONARY ANGIOGRAPHY N/A 08/16/2021   Procedure: LEFT HEART CATH AND CORONARY ANGIOGRAPHY;  Surgeon: [name], MD;  Location: [facility] INVASIVE CV LAB;  Service: Cardiovascular;  Laterality: N/A;     A IV Location/Drains/Wounds Patient Lines/Drains/Airways Status     Active Line/Drains/Airways     Name Placement date Placement time Site Days   Peripheral IV 10/09/22 20 G Anterior;Right Hand 10/09/22  0810  Hand  less than 1            Intake/Output Last 24 hours  Intake/Output Summary (Last 24 hours) at 10/09/2022 1514 Last data filed at 10/09/2022 1016 Gross per 24 hour  Intake --  Output 1875 ml  Net -1875 ml    Labs/Imaging Results for orders placed or performed during the hospital encounter of 10/09/22 (from the past 48 hour(s))  CBC  Status: Abnormal   Collection Time: 10/09/22  7:57 AM  Result Value Ref Range   WBC 6.0 4.0 - 10.5 K/uL   RBC 3.92 (L) 4.22 - 5.81 MIL/uL   Hemoglobin 12.0 (L) 13.0 - 17.0 g/dL   HCT 69.6 (L) 29.5 - 28.4 %   MCV 92.9 80.0 - 100.0 fL   MCH 30.6 26.0 - 34.0 pg   MCHC 33.0 30.0 - 36.0 g/dL   RDW 13.2 44.0 - 10.2 %   Platelets 187 150 - 400 K/uL   nRBC 0.0 0.0 - 0.2 %    Comment: Performed at [facility], [address], [place], [place] [address]  Comprehensive metabolic panel     Status: Abnormal   Collection Time: 10/09/22  7:57 AM  Result Value Ref Range   Sodium 138 135 - 145 mmol/L   Potassium 3.5 3.5  - 5.1 mmol/L   Chloride 108 98 - 111 mmol/L   CO2 21 (L) 22 - 32 mmol/L   Glucose, Bld 161 (H) 70 - 99 mg/dL    Comment: Glucose reference range applies only to samples taken after fasting for at least 8 hours.   BUN 12 6 - 20 mg/dL   Creatinine, Ser 6.44 0.61 - 1.24 mg/dL   Calcium 8.0 (L) 8.9 - 10.3 mg/dL   Total Protein 6.3 (L) 6.5 - 8.1 g/dL   Albumin 3.2 (L) 3.5 - 5.0 g/dL   AST 33 15 - 41 U/L   ALT 34 0 - 44 U/L   Alkaline Phosphatase 38 38 - 126 U/L   Total Bilirubin 0.5 0.3 - 1.2 mg/dL   GFR, Estimated >03 >47 mL/min    Comment: (NOTE) Calculated using the CKD-EPI Creatinine Equation (2021)    Anion gap 9 5 - 15    Comment: Performed at [facility], [address], [place], [place] [address]  Brain natriuretic peptide     Status: Abnormal   Collection Time: 10/09/22  7:57 AM  Result Value Ref Range   B Natriuretic Peptide 218.8 (H) 0.0 - 100.0 pg/mL    Comment: Performed at [facility], [address], [place], [place] [address]  Troponin I (High Sensitivity)     Status: Abnormal   Collection Time: 10/09/22  7:57 AM  Result Value Ref Range   Troponin I (High Sensitivity) 28 (H) <18 ng/L    Comment: (NOTE) Elevated high sensitivity troponin I (hsTnI) values and significant  changes across serial measurements may suggest ACS but many other  chronic and acute conditions are known to elevate hsTnI results.  Refer to the "Links" section for chest pain algorithms and additional  guidance. Performed at [facility], [address], [place], [place] [address]   Magnesium     Status: None   Collection Time: 10/09/22  7:57 AM  Result Value Ref Range   Magnesium 1.8 1.7 - 2.4 mg/dL    Comment: Performed at [facility], [address] [place], [place] [address]  Troponin I (High Sensitivity)     Status: Abnormal   Collection Time: 10/09/22  9:57 AM  Result Value Ref Range   Troponin I (High Sensitivity) 32 (H) <18 ng/L    Comment:  (NOTE) Elevated high sensitivity troponin I (hsTnI) values and significant  changes across serial measurements may suggest ACS but many other  chronic and acute conditions are known to elevate hsTnI results.  Refer to the "Links" section for chest pain  algorithms and additional  guidance. Performed at [facility], [address] [place], [place] [address]   D-dimer, quantitative     Status: Abnormal   Collection Time: 10/09/22  9:57 AM  Result Value Ref Range   D-Dimer, Quant 2.53 (H) 0.00 - 0.50 ug/mL-FEU    Comment: (NOTE) At the manufacturer cut-off value of 0.5 g/mL FEU, this assay has a negative predictive value of 95-100%.This assay is intended for use in conjunction with a clinical pretest probability (PTP) assessment model to exclude pulmonary embolism (PE) and deep venous thrombosis (DVT) in outpatients suspected of PE or DVT. Results should be correlated with clinical presentation. Performed at [facility], [address], [place], [place] [address]    DG Chest Port 1 View  Result Date: 10/09/2022 CLINICAL DATA:  47 year old male with shortness of breath. EXAM: PORTABLE CHEST 1 VIEW COMPARISON:  Chest radiographs 05/01/2022 and earlier. FINDINGS: Portable AP view at 0838 hours. Cardiomegaly, confirmed by CTA in January this year. Stable cardiac size and mediastinal contours. Mildly lower lung volumes. Visualized tracheal air column is within normal limits. Allowing for portable technique the lungs are clear. No pneumothorax or pleural effusion. No acute osseous abnormality identified. IMPRESSION: Stable cardiomegaly. No acute cardiopulmonary abnormality. Electronically Signed   By: Odessa Fleming M.D.   On: 10/09/2022 08:58    Pending Labs Unresulted Labs (From admission, onward)     Start     Ordered   10/10/22 0500  Basic metabolic panel  Daily,   R      10/09/22 1135   10/10/22 0500  HIV Antibody (routine testing w rflx)  (HIV Antibody (Routine testing w  reflex) panel)  Tomorrow morning,   R        10/09/22 1151   10/10/22 0500  CBC with Differential/Platelet  Tomorrow morning,   R        10/09/22 1252   10/10/22 0500  Ferritin  Tomorrow morning,   R        10/09/22 1252   10/10/22 0500  Iron and TIBC  Tomorrow morning,   R        10/09/22 1252            Vitals/Pain Today's Vitals   10/09/22 0930 10/09/22 1000 10/09/22 1021 10/09/22 1328  BP: (!) 123/107 (!) 123/107 (!) 138/106 (!) 121/98  Pulse: 98 86 94 93  Resp: (!) 26 19 (!) 24 (!) 25  Temp:   98.3 F (36.8 C)   TempSrc:   Oral   SpO2: 96% 93% 98% 92%  Weight:      Height:      PainSc:        Isolation Precautions No active isolations  Medications Medications  furosemide (LASIX) injection 40 mg (has no administration in time range)  enoxaparin (LOVENOX) injection 70 mg (has no administration in time range)  acetaminophen (TYLENOL) tablet 650 mg (has no administration in time range)    Or  acetaminophen (TYLENOL) suppository 650 mg (has no administration in time range)  aspirin EC tablet 81 mg (has no administration in time range)  atorvastatin (LIPITOR) tablet 80 mg (has no administration in time range)  dapagliflozin propanediol (FARXIGA) tablet 10 mg (has no administration in time range)  isosorbide dinitrate (ISORDIL) tablet 10 mg (has no administration in time range)  sacubitril-valsartan (ENTRESTO) 24-26 mg per tablet (has no administration in time range)  carvedilol (COREG) tablet 6.25 mg (has no administration in time range)  magnesium sulfate IVPB 2 g 50 mL (has no administration in time range)  furosemide (LASIX) injection 60 mg (60 mg Intravenous Given 10/09/22 0823)    Mobility walks     Focused Assessments Pulmonary Assessment Handoff:  Lung sounds:   O2 Device: Room Air      R Recommendations: See Admitting Provider Note  Report given to:   Additional Notes: Very nice, alert and oriented.  Ambulatory

## 2022-10-09 NOTE — ED Triage Notes (Signed)
Pt BIB [facility] from home d/t SOB from not having his lasix since Thursday (10/06/22), usually took two 20 mg tabs daily. Pt attempted to take an uber to ED & was too SOB to tol the ride here so he stayed home & called 911. EMS reports the initially noted his tachypnea & was 50 resp/min. He was feeling tight in the upper chest, LS were clear & SBP was 150, with RA at 96%. Once in their truck he was very SOB & RA sat dropped to the 80's & his saturation was maintained with 2L via n/c while in route to ED & was 96% upon arrival on RA. While en route he was given Nitrox3 since his chest discomfort maintained & his BP went to 230/110, after that his SBP was 144/103 upon arrival. Does not wear home O2 & Hx of CHF & sleep apnea (per pt).

## 2022-10-09 NOTE — Plan of Care (Signed)

## 2022-10-09 NOTE — Hospital Course (Addendum)
Acute exacerbation of CHF Elevated D Dimer Previous echo done 08/15/22 showed LVEF 30-35%. Pt started on Entresto; also on carvedilol, hydralazine, Lasix. Presented 7/28 with SOB< orthopnea, PND in setting of not taking his Lasix for four days consistent with acute exacerbation of CHF. BNP also elevated at 218. Repeat echo done ***. CTA done ***. He was started on IV Lasix 40 mg BID. He was discharged on Lasix ***.   Normocytic Anemia Pt with newly decreased Hgb of 12 on 7/28. Repeat CBC done 7/29 showed Hgb of ***.

## 2022-10-09 NOTE — H&P (Signed)
Date: 10/09/2022               Patient Name:  Joseph Ray MRN: [id number]  DOB: [birth year] Age / Sex: 47 y.o., male   PCP: Annett Fabian, MD              Medical Service: Internal Medicine Teaching Service              Attending Physician: Dr. Mercie Eon, MD    First Contact: Governor Rooks, MS3 Pager: [phone]  Second Contact: Dr. Denton Brick Pager: [phone]  Third Contact Dr. Morene Crocker Pager: [phone]       After Hours (After 5p/  First Contact Pager: [phone]  weekends / holidays): Second Contact Pager: [phone]   Chief Complaint: "Shortness of breath"  History of Present Illness:   Joseph Ray is a 47 y.o. M with a PMHx of NSTEMI 2023 w/ med Rx, chronic combined CHF, HTN, prediabetes, HLD, OSA admitted for shortness of breath and pressure on his chest.   Pt reports his shortness of breath started last night; he denies shortness of breath prior to this. He reports his symptoms, including chest pressure, were worse when lying flat, but that they occurred at rest while sitting up as well. He reports PND; he has a history of sleep apnea, but reports these nighttime episodes were different and he felt getting woken up due to SOB. He denies current chest pain; he reports some chest pressure which began around the same time as his SOB. Denies radiation to neck, jaw, arms, N/V, dysuria, or problems with BM. Denies exertional exacerbation of SOB, chest pain. Denies lower extremity edema. Does report feeling "bloated" in his abdomen.   He reports he has been out of his Lasix for a couple days; he last took this Thursday 7/25 and had been taking 40 mg daily, which he reports is up from his usual dose of 20 mg daily. He also reports eating poorly yesterday (7/27) with a lot of salty and fatty foods.  Denies headche, dizziness, lightheadedness, fevers, chills, or body aches.   History of OSA. Not using a mask at home as he has not yet picked up his CPAP  machine.  Patient confirmed full code status and expressed he would like his mother to make medical decisions should he not be able to do so during this admission.   ED Course significant for pt presenting via [facility] from home. EMS reports his O2 sats dropped to the 80s on room air and was maintained on 2L [place]. He was given NTG x3 by EMS with BP up to 230/110, improved by arrival. Troponins minimally elevated x2. D Dimer of 2.53. CXR with stable cardiomegaly, no acute cardiopulmonary abnormality. ECG sinus rhythm without ST elevations. He received one 60 mg dose of IV Lasix in the ED.  Meds:  Aspirin 81 Lipitor 80 Carvedilol 6.25 mg bid Plavix 75 mg - ran out months ago - supposed to be on through June 2024 Farxiga 10 mg - ran out months ago Lasix - ran out Thurs 7/25 Hydralazine 25 mg TID Entresto 24-26 mg bid Imdur 10  TID No outpatient medications have been marked as taking for the 10/09/22 encounter [facility] Encounter).   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 08/13/2021   Need for assessment for sleep apnea    NSTEMI (non-ST elevated myocardial infarction) (HCC)    Prediabetes    Family History:  Father has cardiovascular disease (had open heart surgery); first diagnosed in 23s Mother has hypertension No reported Fhx of diabetes  Social History:  Support: family (son) Lives in [place] with his mom Has not recently been tested for STI (denies hx of STI), has one sexual partner No alcohol (quit 2023) Quit tobacco in 2023 Quit  marijuana in 2023 Works for [employer]  Independent of ADL, iADLs Drives, ambultaes without assistive devices PCP: [facility] ([place])  Review of Systems: A complete ROS was negative except as per HPI.  Physical Exam: Blood pressure (!) 138/106, pulse 94, temperature 98.3 F (36.8 C), temperature source Oral, resp. rate (!) 24, height 6\' 3"  (1.905 m), weight (!) 142.4 kg, SpO2 98%. General: Pt is sitting up in hospital bed, obese, no acute distress. Cardiovascular: RRR, no murmurs, rubs, gallops. No JVP noted. 2+ TP pulses bilaterally. Pulmonary: Normal work of breathing on room air (turned off supplemental O2 as patient was maintaining >97% sat while talking). Lungs clear to auscultation bilaterally. Abdomen: Normal bowel sounds. Obese, soft. No tenderness to palpation; no rebound tenderness, no guarding. MSK: None to trace bilateral lower extremity edema; warm, dry. Neuro/Psych: Alert and oriented to person, place, event. (Time not formerly assessed.) Normal affect.     Latest Ref Rng & Units 10/09/2022    7:57 AM 05/01/2022   11:37 PM 04/11/2022   12:09 AM  CMP  Glucose 70 - 99 mg/dL 161  98  096   BUN 6 - 20 mg/dL 12  12  14    Creatinine 0.61 - 1.24 mg/dL 0.45  4.09  8.11   Sodium 135 - 145 mmol/L 138  139  138   Potassium 3.5 - 5.1 mmol/L 3.5  3.5  3.9   Chloride 98 - 111 mmol/L 108  104  105   CO2 22 - 32 mmol/L 21  24  22    Calcium 8.9 - 10.3 mg/dL 8.0  9.2  9.5   Total Protein 6.5 - 8.1 g/dL 6.3     Total Bilirubin 0.3 - 1.2 mg/dL 0.5     Alkaline Phos 38 - 126 U/L 38     AST 15 - 41 U/L 33     ALT 0 - 44 U/L 34     Mag 7/28: 1.8     Latest Ref Rng & Units 10/09/2022    7:57 AM 04/11/2022   12:09 AM 08/16/2021    1:42 AM  CBC  WBC 4.0 - 10.5 K/uL 6.0  8.4  10.6   Hemoglobin 13.0 - 17.0 g/dL 91.4  78.2  95.6   Hematocrit 39.0 - 52.0 % 36.4  41.4  41.4   Platelets 150 - 400 K/uL 187  231  226    Troponin 7/28: @0757  28, @0957  32 D Dimer 7/28: 2.53 BNP 7/28: 218.8  EKG: personally  reviewed my interpretation is sinus rhythm with LAD.  CXR: personally reviewed my interpretation is cardiomegaly, stable; no acute cardiopulmonary abnormality.  Assessment & Plan by Problem: Principal Problem:   Acute exacerbation of CHF (congestive heart failure) (HCC)  Joseph Ray is a 47 y.o.  M with a PMHx of NSTEMI 2023 w/ med Rx, chronic combined CHF, HTN, prediabetes, HLD, OSA admitted for shortness of breath with suspected etiology of CHF exacerbation.  Acute on chronic systolic HF  Elevated D Dimer Acute hypoxic respiratory failure  Most recent echo done 08/15/22 showed LVEF 30-35%. Pt started on Entresto; also on carvedilol, hydralazine, Lasix. He now presents with SOB, orthopnea, possible PND in setting of not taking his Lasix for four days consistent with acute exacerbation of CHF. BNP is also elevated at 218. Low suspicion for ACS etiology of symptoms given minimally elevated troponins, stable ECG from 03/2022; troponins likely elevated in setting of CHF exacerbation. Will evaluate for any changes to EF with repeat echo this admission.  Reasonable to perform CT angiogram at this time given SOB, chest pressure, and elevated D Dimer of 2.53 in the setting of not taking Plavix consistently.    CMP unremarkable with the exception of potassium and magnesium, will replete as needed. Corrected calcium 8.6.  Cardiology consulted and providing recommendations. Will restart pt on home medications outside of Lasix; will start IV Lasix 40 mg BID for diuresis with plans to increase home Lasix dose at discharge. Will continue to monitor electrolytes daily. Appreciate cardiology's continued recommendations.  - CT Angiogram Chest - Echocardiogram; can consult with EP if EF <30% - START IV Lasix 40 mg BID - Regular metabolic panels, monitor and   Hx of NSTEMI Pt has hx of NSTEMI in 08/2021 and underwent cath where he was found to have L Cx lesion of 99% in the mid segment due to thrombus, no  intervention at that time and pt was started on DAPT (Plavix + aspirin) for a year following. Pt reports he has been off Plavix for some months. No need to restart Plavix at this time. - Continue aspirin 81 mg daily - Continue isosorbide dinitrate 10 mg TID  Normocytic Anemia Pt with newly decreased Hgb of 12 on 7/28. Will evaluate with repeat CBC with diff, iron studies, ferritin tomorrow. - CBC with diff, Iron + TIBC, Ferritin  Chronic Conditions: Hypertension: Continue carvedilol 6.25 mg BID, Entresto 24-26 mg BID, hydralazine 25 mg TID. Consider dose increases as blood pressure permits once volume status improves. Hyperlipidemia: Continue Lipitor 80 mg daily Prediabetes: Continue Farxiga 10 mg daily.  Code Status: Full Diet: Heart IV Fluid: None DVT Prophylaxis: Lovenox  Prior to Admission Living Arrangement: Home Anticipated Discharge Location: Home Barriers to Discharge: Medical management + workup  Dispo: Admit patient to Observation with expected length of stay less than 2 midnights.  Signed: Governor Rooks, Medical Student Attestation for Student Documentation:  I personally was present and re-performed the history, physical exam and medical decision-making activities of this service and have verified that the service and findings are accurately documented in the student's note.  Philomena Doheny, MD 10/09/2022, 1:17 PM  10/09/2022, 12:07 PM  Pager: [phone]

## 2022-10-09 NOTE — Progress Notes (Signed)
  Echocardiogram 2D Echocardiogram has been performed.  Delcie Roch 10/09/2022, 3:28 PM

## 2022-10-10 ENCOUNTER — Other Ambulatory Visit (HOSPITAL_COMMUNITY): Payer: Self-pay

## 2022-10-10 DIAGNOSIS — I10 Essential (primary) hypertension: Secondary | ICD-10-CM

## 2022-10-10 DIAGNOSIS — F17210 Nicotine dependence, cigarettes, uncomplicated: Secondary | ICD-10-CM | POA: Diagnosis present

## 2022-10-10 DIAGNOSIS — R0602 Shortness of breath: Secondary | ICD-10-CM | POA: Diagnosis present

## 2022-10-10 DIAGNOSIS — I5023 Acute on chronic systolic (congestive) heart failure: Secondary | ICD-10-CM | POA: Diagnosis not present

## 2022-10-10 DIAGNOSIS — Z91148 Patient's other noncompliance with medication regimen for other reason: Secondary | ICD-10-CM | POA: Diagnosis not present

## 2022-10-10 DIAGNOSIS — Z7982 Long term (current) use of aspirin: Secondary | ICD-10-CM | POA: Diagnosis not present

## 2022-10-10 DIAGNOSIS — I7121 Aneurysm of the ascending aorta, without rupture: Secondary | ICD-10-CM | POA: Diagnosis present

## 2022-10-10 DIAGNOSIS — R7303 Prediabetes: Secondary | ICD-10-CM | POA: Diagnosis present

## 2022-10-10 DIAGNOSIS — Z91119 Patient's noncompliance with dietary regimen due to unspecified reason: Secondary | ICD-10-CM | POA: Diagnosis not present

## 2022-10-10 DIAGNOSIS — G4733 Obstructive sleep apnea (adult) (pediatric): Secondary | ICD-10-CM | POA: Diagnosis present

## 2022-10-10 DIAGNOSIS — I110 Hypertensive heart disease with heart failure: Secondary | ICD-10-CM

## 2022-10-10 DIAGNOSIS — Z7902 Long term (current) use of antithrombotics/antiplatelets: Secondary | ICD-10-CM | POA: Diagnosis not present

## 2022-10-10 DIAGNOSIS — Z7984 Long term (current) use of oral hypoglycemic drugs: Secondary | ICD-10-CM | POA: Diagnosis not present

## 2022-10-10 DIAGNOSIS — Z79899 Other long term (current) drug therapy: Secondary | ICD-10-CM | POA: Diagnosis not present

## 2022-10-10 DIAGNOSIS — Z8249 Family history of ischemic heart disease and other diseases of the circulatory system: Secondary | ICD-10-CM | POA: Diagnosis not present

## 2022-10-10 DIAGNOSIS — Z91199 Patient's noncompliance with other medical treatment and regimen due to unspecified reason: Secondary | ICD-10-CM | POA: Diagnosis not present

## 2022-10-10 DIAGNOSIS — I5043 Acute on chronic combined systolic (congestive) and diastolic (congestive) heart failure: Secondary | ICD-10-CM | POA: Diagnosis present

## 2022-10-10 DIAGNOSIS — J9601 Acute respiratory failure with hypoxia: Secondary | ICD-10-CM

## 2022-10-10 DIAGNOSIS — E669 Obesity, unspecified: Secondary | ICD-10-CM | POA: Diagnosis present

## 2022-10-10 DIAGNOSIS — Z6839 Body mass index (BMI) 39.0-39.9, adult: Secondary | ICD-10-CM | POA: Diagnosis not present

## 2022-10-10 DIAGNOSIS — I714 Abdominal aortic aneurysm, without rupture, unspecified: Secondary | ICD-10-CM | POA: Diagnosis present

## 2022-10-10 DIAGNOSIS — I252 Old myocardial infarction: Secondary | ICD-10-CM | POA: Diagnosis not present

## 2022-10-10 DIAGNOSIS — Z8616 Personal history of COVID-19: Secondary | ICD-10-CM | POA: Diagnosis not present

## 2022-10-10 DIAGNOSIS — I2510 Atherosclerotic heart disease of native coronary artery without angina pectoris: Secondary | ICD-10-CM | POA: Diagnosis present

## 2022-10-10 DIAGNOSIS — E785 Hyperlipidemia, unspecified: Secondary | ICD-10-CM | POA: Diagnosis present

## 2022-10-10 DIAGNOSIS — Z880 Allergy status to penicillin: Secondary | ICD-10-CM | POA: Diagnosis not present

## 2022-10-10 DIAGNOSIS — Z87891 Personal history of nicotine dependence: Secondary | ICD-10-CM

## 2022-10-10 MED ORDER — POTASSIUM CHLORIDE CRYS ER 20 MEQ PO TBCR
40.0000 meq | EXTENDED_RELEASE_TABLET | Freq: Every day | ORAL | Status: DC
  Administered 2022-10-10 – 2022-10-12 (×3): 40 meq via ORAL
  Filled 2022-10-10 (×3): qty 2

## 2022-10-10 MED ORDER — POTASSIUM CHLORIDE CRYS ER 20 MEQ PO TBCR
40.0000 meq | EXTENDED_RELEASE_TABLET | Freq: Once | ORAL | Status: AC
  Administered 2022-10-10: 40 meq via ORAL
  Filled 2022-10-10: qty 2

## 2022-10-10 NOTE — Telephone Encounter (Signed)
Patient of Dr. Tereso Newcomer. Please review for refill. Thank you!

## 2022-10-10 NOTE — Progress Notes (Signed)
Rounding Note    Patient Name: Joseph Ray Date of Encounter: 10/10/2022  [facility] Cardiologist: Maisie Fus, MD   Subjective  Hx CHF, exacerbation related to med/diet non-compliance, admitted 07/28  Feels much better, breathing almost at baseline Has scales, understands he cannot miss meds and needs to be more consistent w/ low Na diet.  Abd much softer than on admit   Inpatient Medications    Scheduled Meds:  aspirin EC  81 mg Oral Daily   atorvastatin  80 mg Oral Daily   carvedilol  6.25 mg Oral BID WC   dapagliflozin propanediol  10 mg Oral QAC breakfast   enoxaparin (LOVENOX) injection  70 mg Subcutaneous Daily   furosemide  40 mg Intravenous BID   isosorbide dinitrate  10 mg Oral TID   potassium chloride  40 mEq Oral Once   sacubitril-valsartan  1 tablet Oral BID   Continuous Infusions:  PRN Meds: acetaminophen **OR** acetaminophen   Vital Signs    Vitals:   10/09/22 1917 10/09/22 2358 10/10/22 0404 10/10/22 0411  BP: 123/76 117/73 128/88   Pulse: 86 83 86 81  Resp: 17 18 17    Temp: 98.8 F (37.1 C) 98.1 F (36.7 C) 97.6 F (36.4 C)   TempSrc: Oral Oral Oral   SpO2: 94% 98% 95% 93%  Weight:    (!) 143.1 kg  Height:        Intake/Output Summary (Last 24 hours) at 10/10/2022 0743 Last data filed at 10/10/2022 0500 Gross per 24 hour  Intake 410 ml  Output 2275 ml  Net -1865 ml      10/10/2022    4:11 AM 10/09/2022    3:40 PM 10/09/2022    8:08 AM  Last 3 Weights  Weight (lbs) 315 lb 7.7 oz 317 lb 14.5 oz 314 lb  Weight (kg) 143.1 kg 144.2 kg 142.429 kg      Telemetry    SR - Personally Reviewed  ECG    None today - Personally Reviewed  Physical Exam   GEN: No acute distress.   Neck: No JVD seen, difficult to assess 2nd body habitus Cardiac: RRR, no murmurs, rubs, or gallops.  Respiratory: Clear to auscultation bilaterally. GI: Soft, nontender, non-distended  MS: No edema; No deformity. Neuro:  Nonfocal  Psych:  Normal affect   Labs    High Sensitivity Troponin:   Recent Labs  Lab 10/09/22 0757 10/09/22 0957  TROPONINIHS 28* 32*     Chemistry Recent Labs  Lab 10/09/22 0757 10/10/22 0558  NA 138 136  K 3.5 3.2*  CL 108 102  CO2 21* 24  GLUCOSE 161* 130*  BUN 12 12  CREATININE 1.04 1.05  CALCIUM 8.0* 8.8*  MG 1.8  --   PROT 6.3*  --   ALBUMIN 3.2*  --   AST 33  --   ALT 34  --   ALKPHOS 38  --   BILITOT 0.5  --   GFRNONAA >60 >60  ANIONGAP 9 10    Lipids No results for input(s): "CHOL", "TRIG", "HDL", "LABVLDL", "LDLCALC", "CHOLHDL" in the last 168 hours.  Hematology Recent Labs  Lab 10/09/22 0757 10/10/22 0558  WBC 6.0 4.9  RBC 3.92* 4.58  HGB 12.0* 13.8  HCT 36.4* 41.4  MCV 92.9 90.4  MCH 30.6 30.1  MCHC 33.0 33.3  RDW 14.0 14.1  PLT 187 227   Thyroid No results for input(s): "TSH", "FREET4" in the last 168 hours.  BNP  Recent Labs  Lab 10/09/22 0757  BNP 218.8*    DDimer  Recent Labs  Lab 10/09/22 0957  DDIMER 2.53*    Lab Results  Component Value Date   HGBA1C 6.0 (H) 08/13/2021    Radiology    CT Angio Chest Pulmonary Embolism (PE) W or WO Contrast  Result Date: 10/09/2022 CLINICAL DATA:  Positive D-dimer, short of breath EXAM: CT ANGIOGRAPHY CHEST WITH CONTRAST TECHNIQUE: Multidetector CT imaging of the chest was performed using the standard protocol during bolus administration of intravenous contrast. Multiplanar CT image reconstructions and MIPs were obtained to evaluate the vascular anatomy. RADIATION DOSE REDUCTION: This exam was performed according to the departmental dose-optimization program which includes automated exposure control, adjustment of the mA and/or kV according to patient size and/or use of iterative reconstruction technique. CONTRAST:  75mL OMNIPAQUE IOHEXOL 350 MG/ML SOLN COMPARISON:  04/11/2022, 10/09/2022 FINDINGS: Cardiovascular: This is a technically adequate evaluation of the pulmonary vasculature. No filling defects or  pulmonary emboli. The heart is enlarged, with prominent left ventricular dilatation. No pericardial effusion. Normal caliber of the thoracic aorta. 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 Mediastinum/Nodes: No enlarged mediastinal, hilar, or axillary lymph nodes. Thyroid gland, trachea, and esophagus demonstrate no significant findings. Lungs/Pleura: No acute airspace disease, effusion, or pneumothorax. Central airways are patent. Upper Abdomen: No acute abnormality. Musculoskeletal: No acute or destructive bony abnormalities. Reconstructed images demonstrate no additional findings. Review of the MIP images confirms the above findings. IMPRESSION: 1. No evidence of pulmonary embolus. 2. Cardiomegaly. 3. No acute intrathoracic process. Electronically Signed   By: Sharlet Salina M.D.   On: 10/09/2022 23:28   ECHOCARDIOGRAM COMPLETE  Result Date: 10/09/2022    ECHOCARDIOGRAM REPORT   Patient Name:   Joseph Ray Date of Exam: 10/09/2022 Medical Rec #:  [id number]      Height:       75.0 in Accession #:    [id number]     Weight:       314.0 lb Date of Birth:  1975-09-22      BSA:          2.658 m Patient Age:    46 years       BP:           138/106 mmHg Patient Gender: M              HR:           90 bpm. Exam Location:  Inpatient Procedure: 2D Echo, Cardiac Doppler and Color Doppler Indications:    Acute systolic chf  History:        Patient has prior history of Echocardiogram examinations, most                 recent 08/14/2021. CHF, ascending aortic aneurysm; Risk                 Factors:Hypertension, Dyslipidemia and Diabetes.  Sonographer:    Delcie Roch RDCS Referring Phys: [id number] Markia Kyer G Diezel Mazur IMPRESSIONS  1. Left ventricular ejection fraction, by estimation, is 20 to 25%. The left ventricle has severely decreased function. The left ventricle demonstrates global hypokinesis. The left ventricular internal cavity size was moderately dilated. There is mild  left ventricular hypertrophy. Left ventricular diastolic parameters are consistent with Grade I diastolic dysfunction (impaired relaxation).  2. Right ventricular systolic function is normal. The right ventricular size is normal. There is normal pulmonary artery systolic pressure.  3. Left atrial  size was severely dilated.  4. Right atrial size was mildly dilated.  5. The mitral valve is normal in structure. Moderate mitral valve regurgitation. No evidence of mitral stenosis.  6. The aortic valve is tricuspid. Aortic valve regurgitation is not visualized. No aortic stenosis is present.  7. Aortic dilatation noted. There is mild dilatation of the ascending aorta, measuring 41 mm.  8. The inferior vena cava is normal in size with greater than 50% respiratory variability, suggesting right atrial pressure of 3 mmHg. Comparison(s): Prior images reviewed side by side. FINDINGS  Left Ventricle: Left ventricular ejection fraction, by estimation, is 20 to 25%. The left ventricle has severely decreased function. The left ventricle demonstrates global hypokinesis. The left ventricular internal cavity size was moderately dilated. There is mild left ventricular hypertrophy. Left ventricular diastolic parameters are consistent with Grade I diastolic dysfunction (impaired relaxation). Right Ventricle: The right ventricular size is normal. No increase in right ventricular wall thickness. Right ventricular systolic function is normal. There is normal pulmonary artery systolic pressure. The tricuspid regurgitant velocity is 1.78 m/s, and  with an assumed right atrial pressure of 3 mmHg, the estimated right ventricular systolic pressure is 15.7 mmHg. Left Atrium: Left atrial size was severely dilated. Right Atrium: Right atrial size was mildly dilated. Pericardium: There is no evidence of pericardial effusion. Mitral Valve: The mitral valve is normal in structure. Moderate mitral valve regurgitation. No evidence of mitral valve  stenosis. Tricuspid Valve: The tricuspid valve is normal in structure. Tricuspid valve regurgitation is not demonstrated. No evidence of tricuspid stenosis. Aortic Valve: The aortic valve is tricuspid. Aortic valve regurgitation is not visualized. No aortic stenosis is present. Pulmonic Valve: The pulmonic valve was normal in structure. Pulmonic valve regurgitation is not visualized. No evidence of pulmonic stenosis. Aorta: Aortic dilatation noted. There is mild dilatation of the ascending aorta, measuring 41 mm. Venous: The inferior vena cava is normal in size with greater than 50% respiratory variability, suggesting right atrial pressure of 3 mmHg. IAS/Shunts: No atrial level shunt detected by color flow Doppler.  LEFT VENTRICLE PLAX 2D LVIDd:         7.00 cm      Diastology LVIDs:         6.00 cm      LV e' medial:  5.44 cm/s LV PW:         1.40 cm      LV e' lateral: 4.90 cm/s LV IVS:        1.30 cm LVOT diam:     2.30 cm LV SV:         54 LV SV Index:   20 LVOT Area:     4.15 cm  LV Volumes (MOD) LV vol d, MOD A2C: 301.0 ml LV vol s, MOD A2C: 237.0 ml LV SV MOD A2C:     64.0 ml RIGHT VENTRICLE             IVC RV Basal diam:  3.40 cm     IVC diam: 1.80 cm RV S prime:     10.80 cm/s TAPSE (M-mode): 1.9 cm LEFT ATRIUM              Index        RIGHT ATRIUM           Index LA diam:        6.00 cm  2.26 cm/m   RA Area:     19.90 cm LA Vol (A2C):   153.0 ml  57.56 ml/m  RA Volume:   58.10 ml  21.86 ml/m LA Vol (A4C):   105.0 ml 39.50 ml/m LA Biplane Vol: 131.0 ml 49.28 ml/m  AORTIC VALVE LVOT Vmax:   78.90 cm/s LVOT Vmean:  51.300 cm/s LVOT VTI:    0.131 m  AORTA Ao Root diam: 3.80 cm Ao Asc diam:  4.10 cm MR Peak grad: 96.0 mmHg   TRICUSPID VALVE MR Mean grad: 63.0 mmHg   TR Peak grad:   12.7 mmHg MR Vmax:      490.00 cm/s TR Vmax:        178.00 cm/s MR Vmean:     372.0 cm/s                           SHUNTS                           Systemic VTI:  0.13 m                           Systemic Diam: 2.30 cm Donato Schultz MD Electronically signed by Donato Schultz MD Signature Date/Time: 10/09/2022/3:26:58 PM    Final    DG Chest Port 1 View  Result Date: 10/09/2022 CLINICAL DATA:  47 year old male with shortness of breath. EXAM: PORTABLE CHEST 1 VIEW COMPARISON:  Chest radiographs 05/01/2022 and earlier. FINDINGS: Portable AP view at 0838 hours. Cardiomegaly, confirmed by CTA in January this year. Stable cardiac size and mediastinal contours. Mildly lower lung volumes. Visualized tracheal air column is within normal limits. Allowing for portable technique the lungs are clear. No pneumothorax or pleural effusion. No acute osseous abnormality identified. IMPRESSION: Stable cardiomegaly. No acute cardiopulmonary abnormality. Electronically Signed   By: Odessa Fleming M.D.   On: 10/09/2022 08:58    Cardiac Studies   ECHO: 10/09/2022  1. Left ventricular ejection fraction, by estimation, is 20 to 25%. The left ventricle has severely decreased function. The left ventricle demonstrates global hypokinesis. The left ventricular internal cavity size  was moderately dilated. There is mild left ventricular hypertrophy. Left ventricular diastolic parameters are  consistent with Grade I diastolic dysfunction (impaired relaxation).   2. Right ventricular systolic function is normal. The right ventricular size is normal. There is normal pulmonary artery systolic pressure.   3. Left atrial size was severely dilated.   4. Right atrial size was mildly dilated.   5. The mitral valve is normal in structure. Moderate mitral valve regurgitation. No evidence of mitral stenosis.   6. The aortic valve is tricuspid. Aortic valve  regurgitation is not visualized. No aortic stenosis is present.   7. Aortic dilatation noted. There is mild dilatation of the ascending aorta, measuring 41 mm.   8. The inferior vena cava is normal in size with greater than 50% respiratory variability, suggesting right atrial pressure of 3 mmHg.   Comparison(s): Prior  images reviewed side by side.    Patient Profile     47 y.o. male with a hx of chronic combined CHF (EF 30-35% 08/2021), HTN, PRE-DM, NSTEMI 2023 w/ med Rx, obesity, OSA, who was admitted 10/09/2022 for CHF, Cards asked to see.  Assessment & Plan    Acute on chronic combined CHF - EF now lower than previous, 20-25% w/ global HK - had some upper chest pressure when he took a deep breath, that has resolved - he was out  of his Lasix for several days pta, says was compliant w/ other meds - also w/ some dietary indiscretion related to his 75 yo son visiting him - I/O net neg 1.8 L - wt up 6 kg from 2023 starting 03/2022, 314 lbs, he is now 315 lbs -continue diuresis today, supp K+ - he is on Entresto 24-26, Coreg 6.25 mg bid, Farxiga 10 mg every day, isordil 10 mg tid - SBP range 138-117 last 24 hr, discuss w/ MD if we can increase anything, HR 80s - says BP was running high at home, improved w/ volume improvement - he was previously taking Lasix 20 mg x 2 tabs daily >> obvious need to increase this.   Otherwise per IM      For questions or updates, please contact [facility] Please consult [email or web address] for contact info under        Signed, Theodore Demark, PA-C  10/10/2022, 7:43 AM

## 2022-10-10 NOTE — Plan of Care (Signed)

## 2022-10-10 NOTE — Progress Notes (Signed)
   Heart Failure Stewardship Pharmacist Progress Note   PCP: Annett Fabian, MD PCP-Cardiologist: Maisie Fus, MD    HPI:  47 yo M with PMH of CHF, HTN, prediabetes, CAD, obesity, and OSA.   Presented to the ED on 7/28 with shortness of breath, orthopnea, LE edema, and chest tightness. Reports he has not had his lasix since 7/25. He was instructed to double up on his lasix to 40 mg daily but the pharmacy was not able to refill the medication since it was thought he was still taking 20 mg daily (refill too soon error). CXR with cardiomegaly. CTA negative for PE. ECHO 7/28 showed LVEF 20-25% (was 30-35% in 08/2021), global hypokinesis, mild LVH, G1DD, RV normal, moderate MR.  Current HF Medications: Diuretic: furosemide 40 mg IV BID Beta Blocker: carvedilol 6.25 mg BID ACE/ARB/ARNI: Entresto 24/26 mg BID SGLT2i: Farxiga 10 mg daily  Prior to admission HF Medications: Diuretic: furosemide 40 mg daily Beta blocker: carvedilol 6.25 mg BID Other: hydralazine 25 mg TID + Isordil 10 mg TID *not taking Entresto or Farxiga  Pertinent Lab Values: Serum creatinine 1.05, BUN 12, Potassium 3.2, Sodium 136, BNP 218.8, Magnesium 1.8  Vital Signs: Weight: 315 lbs (admission weight: 317 lbs) Blood pressure: 100/50 - 120/80s  Heart rate: 80-90s  I/O: net -1.5L yesterday; net -2.1L since admission  Medication Assistance / Insurance Benefits Check: Does the patient have prescription insurance?  Yes Type of insurance plan: [place] Medicaid  Outpatient Pharmacy:  Prior to admission outpatient pharmacy: [facility] OP Is the patient willing to use [facility] TOC pharmacy at discharge?5 Yes   Assessment: 1. Acute on chronic systolic CHF (LVEF 20-25%), due to NICM. NYHA class III symptoms. - Continue furosemide 40 mg IV BID. Strict I/Os and daily weights. Keep K>4 and Mg>2. KCl 40 mEq x 2 given today. Mg 2 g IV x 1 given today.  - Continue carvedilol 6.25 mg BID - Continue Entresto 24/26 mg BID - Consider starting  spironolactone prior to discharge - Agree with starting Farxiga 10 mg daily   Plan: 1) Medication changes recommended at this time: - Agree with changes  2) Patient assistance: - Prior authorization required for Entresto. May also be required for Farxiga. Benefits investigation is underway.   3)  Education  - To be completed prior to discharge  Sharen Hones, PharmD, BCPS Heart Failure Stewardship Pharmacist Phone [phone]

## 2022-10-10 NOTE — Progress Notes (Signed)
Subjective:  Joseph Ray is a 47 y.o. male with a PMHx significant for NSTEMI 2023 w/ med Rx, chronic combined CHF, HTN, prediabetes, HLD, OSA who was admitted for SOB due to CHF exacerbation.  Pt was examined at bedside today. He reports he is feeling better. He reports breathing has improved and is 90-95% betters. Denies chest pressure, orthopnea. Reports increased urination; denies dysuria, burning with urination. Denies N/V, headache, lightheadedness.   We were able to stop his O2 while in the room (he had been on 2L via [place]) and pt continued to sat well at 95% on RA.  Objective:  Vital signs in last 24 hours: Vitals:   10/10/22 0404 10/10/22 0411 10/10/22 0745 10/10/22 1229  BP: 128/88  124/87 (!) 99/57  Pulse: 86 81 84 78  Resp: 17  17 20   Temp: 97.6 F (36.4 C)  98.1 F (36.7 C) 99.5 F (37.5 C)  TempSrc: Oral  Oral Oral  SpO2: 95% 93% 99% 95%  Weight:  (!) 143.1 kg    Height:       Weight change:   Intake/Output Summary (Last 24 hours) at 10/10/2022 1337 Last data filed at 10/10/2022 1238 Gross per 24 hour  Intake 770 ml  Output 1000 ml  Net -230 ml   Physical Exam General: Pt sitting up in hospital bed. No acute distress. Cardiovascular: RRR, no murmurs, rubs, gallops. Pulmonary: Normal work of breathing. Lungs clear to auscultation bilaterally. Abdomen: Normal bowel sounds. Obese abdomen; softer than prior exam. No tenderness to palpation; no rebound tenderness, no guarding. MSK: No edema of bilateral lower extremities. Warm, dry. Neuro/Psych: Alert and oriented to person, place, event. (Time not formerly assessed.) Normal affect.     Latest Ref Rng & Units 10/10/2022    5:58 AM 10/09/2022    7:57 AM 05/01/2022   11:37 PM  CMP  Glucose 70 - 99 mg/dL 295  621  98   BUN 6 - 20 mg/dL 12  12  12    Creatinine 0.61 - 1.24 mg/dL 3.08  6.57  8.46   Sodium 135 - 145 mmol/L 136  138  139   Potassium 3.5 - 5.1 mmol/L 3.2  3.5  3.5   Chloride 98 - 111 mmol/L 102   108  104   CO2 22 - 32 mmol/L 24  21  24    Calcium 8.9 - 10.3 mg/dL 8.8  8.0  9.2   Total Protein 6.5 - 8.1 g/dL  6.3    Total Bilirubin 0.3 - 1.2 mg/dL  0.5    Alkaline Phos 38 - 126 U/L  38    AST 15 - 41 U/L  33    ALT 0 - 44 U/L  34        Latest Ref Rng & Units 10/10/2022    5:58 AM 10/09/2022    7:57 AM 04/11/2022   12:09 AM  CBC  WBC 4.0 - 10.5 K/uL 4.9  6.0  8.4   Hemoglobin 13.0 - 17.0 g/dL 96.2  95.2  84.1   Hematocrit 39.0 - 52.0 % 41.4  36.4  41.4   Platelets 150 - 400 K/uL 227  187  231   Ferritin 7/29: 292  Iron and TIBC 7/29: Iron 63, TIBC 347  HIV Antibody 7/29: Non reactive  CT Angiogram 7/28: IMPRESSION: 1. No evidence of pulmonary embolus. 2. Cardiomegaly. 3. No acute intrathoracic process.  Echo 7/28: IMPRESSIONS   1. Left ventricular ejection fraction, by estimation, is 20  to 25%. The left ventricle has severely decreased function. The left ventricle demonstrates global hypokinesis. The left ventricular internal cavity size was moderately dilated. There is mild left ventricular hypertrophy. Left ventricular diastolic parameters are consistent with Grade I diastolic dysfunction (impaired relaxation).   2. Right ventricular systolic function is normal. The right ventricular size is normal. There is normal pulmonary artery systolic pressure.   3. Left atrial size was severely dilated.   4. Right atrial size was mildly dilated.   5. The mitral valve is normal in structure. Moderate mitral valve regurgitation. No evidence of mitral stenosis.   6. The aortic valve is tricuspid. Aortic valve regurgitation is not visualized. No aortic stenosis is present.   7. Aortic dilatation noted. There is mild dilatation of the ascending aorta, measuring 41 mm.   8. The inferior vena cava is normal in size with greater than 50% respiratory variability, suggesting right atrial pressure of 3 mmHg.   Assessment/Plan:  Principal Problem:   Acute exacerbation of CHF (congestive  heart failure) (HCC) Active Problems:   Essential hypertension  Joseph Ray is a 47 y.o. male with a PMHx significant for NSTEMI 2023 w/ med Rx, chronic combined CHF, HTN, prediabetes, HLD, OSA who was admitted for SOB due to CHF exacerbation and found to have reduced EF of 20-25%.  Acute on chronic systolic HF  Elevated D Dimer Acute hypoxic respiratory failure  Most recent echo done 08/15/22 showed LVEF 30-35%. Pt started on Entresto; also on carvedilol, hydralazine, Lasix. He presented with acute hypoxic respiratory failure secondary to acute exacerbation of CHF in setting of noncompliance with Lasix and increased sodium consumption. Repeat echo done 7/28 showed decreased LVEF of 20-25%. Cardiology was consulted, who did not recommend LifeVest at this time. Instead, recommended emphasizing importance of medication compliance and close follow-up outpatient to reconsider need for LifeVest or ICD. Pt is currently on IV Lasix 40 mg BID. Also on carvedilol 6.25 mg BID, Entresto 24-26 mg BID, Farxiga 10 mg daily. Do not want to start spironolactone (part of GDMT) at this time given low BP of 99/57. Pt does not appear volume overloaded on exam, though reports he is not feeling 100% improved; will reassess tomorrow morning with plan to discharge tomorrow if improved. - Continue IV Lasix 40 mg BID; will plan to transition to po tomorrow (7/30) - Continue carvedilol 6.25 mg BID - Continue Entresto 24-26 mg BID - K supplemented this am at 40 mEq po - Monitor metabolic panels - Keep off O2 supplementation as tolerated   Hx of NSTEMI Pt has hx of NSTEMI in 08/2021 and underwent cath where he was found to have L Cx lesion of 99% in the mid segment due to thrombus, no intervention at that time and pt was started on DAPT (Plavix + aspirin) for a year following. Pt reports he has been off Plavix for some months. No need to restart Plavix at this time. - Continue aspirin 81 mg daily - Continue isosorbide  dinitrate 10 mg TID   Normocytic Anemia, resolved Pt with decreased Hgb of 12 on 7/28; repeat Hgb of 13.8 on 7/29. Iron studies, ferritin normal. No concern at this time for anemia.  Mild Ascending Aortic Aneurysm Echo done 10/09/22 showed mild dilatation of the ascending aorta, measuring 41 mm. Will continue to follow with cardiology outpatient.   Chronic Conditions: Hypertension: Continue carvedilol 6.25 mg BID, Entresto 24-26 mg BID. Holding hydralazine. Hyperlipidemia: Continue Lipitor 80 mg daily. Prediabetes: Continue Farxiga 10 mg  daily.  Code Status: Full Diet: Heart IV Fluid: None DVT Prophylaxis: Lovenox  Dispo: Anticipate dispo tomorrow (7/30) pending reassessment of volume status + cards sign-off   LOS: 1 days  Governor Rooks, Medical Student  10/10/2022, 1:37 PM

## 2022-10-10 NOTE — Evaluation (Signed)
Physical Therapy Evaluation Patient Details Name: Joseph Ray MRN: [id number] DOB: 16-Jul-1975 Today's Date: 10/10/2022  History of Present Illness  Pt is a 47 y/o male admitted 7/28 with SOB including chest pressure.  PMHx:  sleep apnea, NSTEMI, s/d HF, HTN,  Clinical Impression  Pt is at or close to baseline functioning and should be safe at home with none or PRN assist. There are no further acute PT needs.  Will sign off at this time.         If plan is discharge home, recommend the following:     Can travel by private vehicle        Equipment Recommendations None recommended by PT  Recommendations for Other Services       Functional Status Assessment Patient has had a recent decline in their functional status and demonstrates the ability to make significant improvements in function in a reasonable and predictable amount of time.     Precautions / Restrictions Precautions Precautions: None      Mobility  Bed Mobility Overal bed mobility: Independent                  Transfers Overall transfer level: Independent                      Ambulation/Gait Ambulation/Gait assistance: Independent Gait Distance (Feet): 300 Feet   Gait Pattern/deviations: WFL(Within Functional Limits)   Gait velocity interpretation: >2.62 ft/sec, indicative of community ambulatory   General Gait Details: age appropriate speeds and ability to abruptly change direction without any deviation  Stairs Stairs: Yes Stairs assistance: Independent Stair Management: No rails, Alternating pattern, Forwards Number of Stairs: 6 General stair comments: safe without the rail  Wheelchair Mobility     Tilt Bed    Modified Rankin (Stroke Patients Only)       Balance Overall balance assessment: Independent                                           Pertinent Vitals/Pain Pain Assessment Pain Assessment: No/denies pain    Home Living Family/patient  expects to be discharged to:: Private residence Living Arrangements: Parent Available Help at Discharge: Available PRN/intermittently Type of Home: House Home Access: Stairs to enter Entrance Stairs-Rails: Doctor, general practice of Steps: 4   Home Layout: One level Home Equipment: None Additional Comments: works at home, Mom work outside the home    Prior Function Prior Level of Function : Independent/Modified Independent             Mobility Comments: Independent, drives, ADLs Comments: Independent     Hand Dominance        Extremity/Trunk Assessment   Upper Extremity Assessment Upper Extremity Assessment: Overall WFL for tasks assessed    Lower Extremity Assessment Lower Extremity Assessment: Overall WFL for tasks assessed    Cervical / Trunk Assessment Cervical / Trunk Assessment: Normal  Communication   Communication: No difficulties  Cognition Arousal/Alertness: Awake/alert Behavior During Therapy: WFL for tasks assessed/performed Overall Cognitive Status: Within Functional Limits for tasks assessed                                          General Comments General comments (skin integrity, edema, etc.): Immediately on return from age appropriate  activity, HR 97 bpm and sats 94% trending up with rest.    Exercises     Assessment/Plan    PT Assessment    PT Problem List         PT Treatment Interventions      PT Goals (Current goals can be found in the Care Plan section)  Acute Rehab PT Goals Patient Stated Goal: home, back to work PT Goal Formulation: All assessment and education complete, DC therapy    Frequency       Co-evaluation               AM-PAC PT "6 Clicks" Mobility  Outcome Measure Help needed turning from your back to your side while in a flat bed without using bedrails?: None Help needed moving from lying on your back to sitting on the side of a flat bed without using bedrails?: None Help  needed moving to and from a bed to a chair (including a wheelchair)?: None Help needed standing up from a chair using your arms (e.g., wheelchair or bedside chair)?: None Help needed to walk in hospital room?: None Help needed climbing 3-5 steps with a railing? : None 6 Click Score: 24    End of Session   Activity Tolerance: Patient tolerated treatment well Patient left: in bed;with call bell/phone within reach        Time: 4098-1191 PT Time Calculation (min) (ACUTE ONLY): 19 min   Charges:   PT Evaluation $PT Eval Low Complexity: 1 Low   PT General Charges $$ ACUTE PT VISIT: 1 Visit         10/10/2022  Jacinto Halim., PT Acute Rehabilitation Services [phone]  (office)  Eliseo Gum Maor Meckel 10/10/2022, 11:31 AM

## 2022-10-10 NOTE — Progress Notes (Signed)
Heart Failure Nurse Navigator Progress Note  PCP: Annett Fabian, MD PCP-Cardiologist: Wyline Mood Admission Diagnosis: Acute on chronic systolic heart failure. Hypertension.  Admitted from: home via EMS  Presentation:   Joseph Ray presented with shortness of breath, reported he hasn't taken his lasix for 5 days. Called EMS sats in the 80's placed on 2 L oxygen. BP 230/110, BLE edema, BNP 218, CXR with cardiomegaly, CTA was negative for PE.   Patient was educated on the sign and symptoms of heart failure, daily weights, when to  call his doctor or go to the ED, diet/ fluid restrictions, taking all medications as prescribed and attending all medical appointments, a HF TOC appointment was scheduled for 10/26/2022 @ 3 pm.   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  Vaping Use   Vaping status: Never Used  Substance and Sexual Activity   Alcohol use: Yes    Comment: social   Drug use: No   Sexual activity: Not on file  Other Topics Concern   Not on file  Social History Narrative   Not on file   Social Determinants of Health   Financial  Resource Strain: Not on file  Food Insecurity: No Food Insecurity (10/09/2022)   Hunger Vital Sign    Worried About Running Out of Food in the Last Year: Never true    Ran Out of Food in the Last Year: Never true  Transportation Needs: No Transportation Needs (10/09/2022)   PRAPARE - Administrator, Civil Service (Medical): No    Lack of Transportation (Non-Medical): No  Physical Activity: Not on file  Stress: Not on file  Social Connections: Unknown (07/16/2021)   Received from [facility]   Social Network    Social Network: Not on file   Education Assessment and Provision:  Detailed education and instructions provided on heart failure disease management including the following:  Signs and symptoms of Heart Failure When to call the physician Importance of daily weights Low sodium diet Fluid restriction Medication management Anticipated future follow-up appointments  Patient education given on each of the above topics.  Patient acknowledges understanding via teach back method and acceptance of all instructions.  Education Materials:  "Living Better With Heart Failure" Booklet, HF zone tool, & Daily Weight Tracker Tool.  Patient has scale at home: yes Patient has pill box at home: NA    High Risk Criteria for Readmission and/or Poor Patient Outcomes: Heart failure hospital admissions (last 6 months):  0  No Show rate: 17% Difficult social situation: No Demonstrates medication adherence: No, reported, ran out of medications due to issues with his insurance company.   Primary Language: English  Literacy level: Reading, writing,and comprehension  Barriers of Care:   Diet/ fluid compliance Medication compliance Daily weights  Considerations/Referrals:   Referral made to Heart Failure Pharmacist Stewardship: Yes Referral made to Heart Failure CSW/NCM TOC: No Referral made to Heart & Vascular TOC clinic: Yes, 10/26/2022 @ 3 pm.   Items for Follow-up on  DC/TOC: Medication compliance (reported he ran out of his medication due to insurance issues) Diet/ fluid restrictions/ compliance.  Daily weights Continued HF education   Rhae Hammock, BSN, RN Heart Failure Print production planner Chat Only

## 2022-10-10 NOTE — Evaluation (Signed)
Occupational Therapy Evaluation Patient Details Name: Joseph Ray MRN: [id number] DOB: 1975-06-20 Today's Date: 10/10/2022   History of Present Illness Pt is a 47 y/o male admitted 7/28 with SOB including chest pressure.  PMHx:  sleep apnea, NSTEMI, s/d HF, HTN,   Clinical Impression   Pt ind at baseline with ADLs/functional mobility, lives with his mother and reports working at [employer]. Pt currently at baseline completing ADLs, tub transfer and in room ambulation without assist. Pt educated on energy conservation and CHF mgmt strategies and pt verbalized understanding. Pt presenting with impairments listed below, however has no acute OT needs at this time, will s/o. Please reconsult if there is a change in pt status. Anticipate no OT follow up needs at d/c.       Recommendations for follow up therapy are one component of a multi-disciplinary discharge planning process, led by the attending physician.  Recommendations may be updated based on patient status, additional functional criteria and insurance authorization.   Assistance Recommended at Discharge None  Patient can return home with the following Assistance with cooking/housework    Functional Status Assessment  Patient has had a recent decline in their functional status and demonstrates the ability to make significant improvements in function in a reasonable and predictable amount of time.  Equipment Recommendations  None recommended by OT    Recommendations for Other Services       Precautions / Restrictions Precautions Precautions: None Restrictions Weight Bearing Restrictions: No      Mobility Bed Mobility               General bed mobility comments: OOB in chair upon arrival and departure    Transfers Overall transfer level: Independent                        Balance Overall balance assessment: Independent                                         ADL either performed or  assessed with clinical judgement   ADL Overall ADL's : At baseline                                       General ADL Comments: pt performing LB ADL and tub transfer without assist, reports has been mobilizing independently in room     Vision   Vision Assessment?: No apparent visual deficits     Perception Perception Perception Tested?: No   Praxis Praxis Praxis tested?: Not tested    Pertinent Vitals/Pain Pain Assessment Pain Assessment: No/denies pain     Hand Dominance     Extremity/Trunk Assessment Upper Extremity Assessment Upper Extremity Assessment: Overall WFL for tasks assessed   Lower Extremity Assessment Lower Extremity Assessment: Defer to PT evaluation   Cervical / Trunk Assessment Cervical / Trunk Assessment: Normal   Communication Communication Communication: No difficulties   Cognition Arousal/Alertness: Awake/alert Behavior During Therapy: WFL for tasks assessed/performed Overall Cognitive Status: Within Functional Limits for tasks assessed                                       General Comments  VSS on RA    Exercises  Shoulder Instructions      Home Living Family/patient expects to be discharged to:: Private residence Living Arrangements: Parent Available Help at Discharge: Available PRN/intermittently Type of Home: House Home Access: Stairs to enter [employer] of Steps: 4 Entrance Stairs-Rails: Right;Left Home Layout: One level     Bathroom Shower/Tub: Chief Strategy Officer: Standard Bathroom Accessibility: Yes   Home Equipment: None   Additional Comments: works at home, Mom work outside the home      Prior Functioning/Environment Prior Level of Function : Independent/Modified Independent             Mobility Comments: Independent, drives, ADLs Comments: Independent; works for [employer]        OT Problem List:        OT Treatment/Interventions:      OT  Goals(Current goals can be found in the care plan section) Acute Rehab OT Goals Patient Stated Goal: none stated OT Goal Formulation: With patient Time For Goal Achievement: 10/24/22 Potential to Achieve Goals: Good  OT Frequency:      Co-evaluation              AM-PAC OT "6 Clicks" Daily Activity     Outcome Measure Help from another person eating meals?: None Help from another person taking care of personal grooming?: None Help from another person toileting, which includes using toliet, bedpan, or urinal?: None Help from another person bathing (including washing, rinsing, drying)?: None Help from another person to put on and taking off regular upper body clothing?: None Help from another person to put on and taking off regular lower body clothing?: None 6 Click Score: 24   End of Session Nurse Communication: Mobility status  Activity Tolerance: Patient tolerated treatment well Patient left: in chair;with call bell/phone within reach  OT Visit Diagnosis: Muscle weakness (generalized) (M62.81)                Time: 4540-9811 OT Time Calculation (min): 9 min Charges:  OT General Charges $OT Visit: 1 Visit OT Evaluation $OT Eval Low Complexity: 1 Low  Joseph Ray, OTD, OTR/L SecureChat Preferred Acute Rehab [phone]   Joseph Ray 10/10/2022, 2:39 PM

## 2022-10-10 NOTE — TOC Initial Note (Signed)
Transition of Care [facility]) - Initial/Assessment Note    Patient Details  Name: Joseph Ray MRN: [id number] Date of Birth: 03/23/1975  Transition of Care [facility]) CM/SW Contact:    Leone Haven, RN Phone Number: 10/10/2022, 4:39 PM  Clinical Narrative:                 From home with his Mother, he has PCP, Dr. Carolan Clines, he has insurance on file.  He states he has Medicaid which pays for his medications with a 4.00 co pay.  He states he does not have any HH services in place at this time or DME at home.  He states is cousin or Mom will transport him home at dc and they are his support system.  Pta self ambulatory.  He will be on entresto and [place].  Entresto may need prior auth.   Expected Discharge Plan: Home/Self Care Barriers to Discharge: Continued Medical Work up   Patient Goals and CMS Choice Patient states their goals for this hospitalization and ongoing recovery are:: return home   Choice offered to / list presented to : NA      Expected Discharge Plan and Services In-house Referral: NA Discharge Planning Services: CM Consult Post Acute Care Choice: NA Living arrangements for the past 2 months: Single Family Home                 DME Arranged: N/A DME Agency: NA       HH Arranged: NA          Prior Living Arrangements/Services Living arrangements for the past 2 months: Single Family Home Lives with:: Parents (Mom) Patient language and need for interpreter reviewed:: Yes Do you feel safe going back to the place where you live?: Yes      Need for Family Participation in Patient Care: Yes (Comment) Care giver support system in place?: Yes (comment)   Criminal Activity/Legal Involvement Pertinent to Current Situation/Hospitalization: No - Comment as needed  Activities of Daily Living Home Assistive Devices/Equipment: None ADL Screening (condition at time of admission) Patient's cognitive ability adequate to safely complete daily activities?:  Yes Is the patient deaf or have difficulty hearing?: No Does the patient have difficulty seeing, even when wearing glasses/contacts?: No Does the patient have difficulty concentrating, remembering, or making decisions?: No Patient able to express need for assistance with ADLs?: Yes Does the patient have difficulty dressing or bathing?: No Independently performs ADLs?: Yes (appropriate for developmental age) Does the patient have difficulty walking or climbing stairs?: No Weakness of Legs: None Weakness of Arms/Hands: None  Permission Sought/Granted Permission sought to share information with : Case Manager Permission granted to share information with : Yes, Verbal Permission Granted              Emotional Assessment Appearance:: Appears stated age Attitude/Demeanor/Rapport: Engaged Affect (typically observed): Appropriate Orientation: : Oriented to Self, Oriented to Place, Oriented to  Time, Oriented to Situation   Psych Involvement: No (comment)  Admission diagnosis:  Acute exacerbation of CHF (congestive heart failure) (HCC) [I50.9] Patient Active Problem List   Diagnosis Date Noted   Essential hypertension 10/10/2022   Acute exacerbation of CHF (congestive heart failure) (HCC) 10/09/2022   Loud snoring 04/20/2022   Prediabetes 08/18/2021   Need for assessment for sleep apnea 08/18/2021   Obesity (BMI 35.0-39.9 without comorbidity) 08/18/2021   NSTEMI (non-ST elevated myocardial infarction) (HCC) 08/13/2021   DOE (dyspnea on exertion) 08/28/2020   Myocardiopathy (HCC) 08/28/2020  Umbilical hernia without obstruction and without gangrene 08/20/2020   Hyperlipidemia associated with type 2 diabetes mellitus (HCC) 08/12/2020   Type 2 diabetes mellitus, without long-term current use of insulin (HCC) 08/12/2020   Chronic systolic congestive heart failure (HCC) 08/11/2020   Combined systolic and diastolic congestive heart failure (HCC) 03/11/2020   Hypertension associated with  diabetes (HCC) 03/11/2020   Ascending aorta dilatation (HCC) 03/11/2020   PCP:  Annett Fabian, MD Pharmacy:   [place] [facility] [address] [place] [address] Phone: [phone] Fax: [phone]  [id number] - [place], [place] - [address] [place] [address] Phone: [phone] Fax: [phone]     Social Determinants of Health (SDOH) Social History: SDOH Screenings   Food Insecurity: No Food Insecurity (10/09/2022)  Housing: Low Risk  (10/09/2022)  Transportation Needs: No Transportation Needs (10/09/2022)  Utilities: Not At Risk (10/09/2022)  Depression (PHQ2-9): Low Risk  (10/20/2021)  Social Connections: Unknown (07/16/2021)   Received from [facility]  Tobacco Use: Medium Risk (10/09/2022)   SDOH Interventions:     Readmission Risk Interventions     No data to display

## 2022-10-11 ENCOUNTER — Encounter (HOSPITAL_COMMUNITY): Payer: Self-pay | Admitting: Internal Medicine

## 2022-10-11 ENCOUNTER — Other Ambulatory Visit (HOSPITAL_COMMUNITY): Payer: Self-pay

## 2022-10-11 DIAGNOSIS — J9601 Acute respiratory failure with hypoxia: Secondary | ICD-10-CM | POA: Diagnosis not present

## 2022-10-11 DIAGNOSIS — Z87891 Personal history of nicotine dependence: Secondary | ICD-10-CM | POA: Diagnosis not present

## 2022-10-11 DIAGNOSIS — I5043 Acute on chronic combined systolic (congestive) and diastolic (congestive) heart failure: Secondary | ICD-10-CM | POA: Diagnosis not present

## 2022-10-11 DIAGNOSIS — I110 Hypertensive heart disease with heart failure: Secondary | ICD-10-CM | POA: Diagnosis not present

## 2022-10-11 LAB — BASIC METABOLIC PANEL
Anion gap: 11 (ref 5–15)
BUN: 16 mg/dL (ref 6–20)
CO2: 23 mmol/L (ref 22–32)
Calcium: 9.1 mg/dL (ref 8.9–10.3)
Chloride: 103 mmol/L (ref 98–111)
Creatinine, Ser: 1.1 mg/dL (ref 0.61–1.24)
GFR, Estimated: >60 mL/min (ref >60)
Glucose, Bld: 128 mg/dL — ABNORMAL HIGH (ref 70–99)
Potassium: 4 mmol/L (ref 3.5–5.1)
Sodium: 137 mmol/L (ref 135–145)

## 2022-10-11 LAB — MAGNESIUM: Magnesium: 2.1 mg/dL (ref 1.7–2.4)

## 2022-10-11 MED ORDER — FUROSEMIDE 10 MG/ML IJ SOLN
40.0000 mg | Freq: Once | INTRAMUSCULAR | Status: AC
  Administered 2022-10-11: 40 mg via INTRAVENOUS
  Filled 2022-10-11: qty 4

## 2022-10-11 MED ORDER — FUROSEMIDE 40 MG PO TABS
60.0000 mg | ORAL_TABLET | Freq: Every day | ORAL | Status: DC
  Administered 2022-10-12: 60 mg via ORAL
  Filled 2022-10-11: qty 1

## 2022-10-11 MED ORDER — CLOPIDOGREL BISULFATE 75 MG PO TABS
75.0000 mg | ORAL_TABLET | Freq: Every day | ORAL | Status: DC
  Administered 2022-10-11 – 2022-10-12 (×2): 75 mg via ORAL
  Filled 2022-10-11 (×2): qty 1

## 2022-10-11 NOTE — Progress Notes (Signed)
Subjective:  Joseph Ray is a 47 y.o. male with a PMHx significant for NSTEMI 2023 w/ med Rx, chronic combined CHF, HTN, prediabetes, HLD, OSA who was admitted for SOB due to CHF exacerbation.  Pt was examined at bedside today. He reports he feels great today and is overall 98.5% better, nearly back to how he felt before coming into the hospital. He denies any shortness of breath at rest, lying down, or with walking. He did well with PT/OT yesterday. He has not needed any O2 supplementation last night/today. He denies chest pain, pressure. He reports that his medication refills are ready to go at his pharmacy.  Objective:  Vital signs in last 24 hours: Vitals:   10/10/22 1943 10/11/22 0011 10/11/22 0434 10/11/22 0722  BP: 101/69 103/75 (!) 116/90 (!) 119/91  Pulse: 81 77 83 78  Resp: 17 17 18 18   Temp: 97.8 F (36.6 C) 97.7 F (36.5 C) 97.8 F (36.6 C) (!) 97.5 F (36.4 C)  TempSrc: Oral Oral Oral Oral  SpO2: 95% 93% 93% 96%  Weight:   (!) 142.6 kg   Height:       Weight change: 0.171 kg  Intake/Output Summary (Last 24 hours) at 10/11/2022 1046 Last data filed at 10/11/2022 0900 Gross per 24 hour  Intake 580 ml  Output 1975 ml  Net -1395 ml   Physical Exam General: Pt is in hospital bed with head of bed raised, no acute distress. Cardiovascular: RRR, no murmurs, rubs, gallops. No appreciable JVD, negative jugulo-hepatic reflex. Pulmonary: Normal work of breathing. Lungs clear to auscultation bilaterally. Abdomen: Normal bowel sounds. Obese but soft. No tenderness to palpation; no rebound tenderness, no guarding. MSK: Trace ankle edema, no edema of bilateral lower extremities/shins. Warm, dry. Neuro/Psych: Alert and oriented to person, place, event. (Time not formerly assessed.) Normal affect.     Latest Ref Rng & Units 10/11/2022    6:12 AM 10/11/2022    2:52 AM 10/10/2022    5:58 AM  CMP  Glucose 70 - 99 mg/dL 161  096  045   BUN 6 - 20 mg/dL 16  16  12     Creatinine 0.61 - 1.24 mg/dL 4.09  8.11  9.14   Sodium 135 - 145 mmol/L 137  137  136   Potassium 3.5 - 5.1 mmol/L 4.0  3.8  3.2   Chloride 98 - 111 mmol/L 103  102  102   CO2 22 - 32 mmol/L 23  25  24    Calcium 8.9 - 10.3 mg/dL 9.1  9.1  8.8   Mag 7/82: 2.1     Latest Ref Rng & Units 10/10/2022    5:58 AM 10/09/2022    7:57 AM 04/11/2022   12:09 AM  CBC  WBC 4.0 - 10.5 K/uL 4.9  6.0  8.4   Hemoglobin 13.0 - 17.0 g/dL 95.6  21.3  08.6   Hematocrit 39.0 - 52.0 % 41.4  36.4  41.4   Platelets 150 - 400 K/uL 227  187  231    Assessment/Plan:  Principal Problem:   Acute exacerbation of CHF (congestive heart failure) (HCC) Active Problems:   Essential hypertension   Acute on chronic combined systolic and diastolic CHF (congestive heart failure) (HCC)  Joseph Ray is a 47 y.o. male with a PMHx significant for NSTEMI 2023 w/ med Rx, chronic combined CHF, HTN, prediabetes, HLD, OSA who was admitted for SOB due to CHF exacerbation.  Acute on chronic systolic HF  Acute hypoxic respiratory failure  Most recent echo done 08/15/22 showed LVEF 30-35%. Pt started on Entresto; also on carvedilol, hydralazine, Lasix. He presented with acute hypoxic respiratory failure secondary to acute exacerbation of CHF in setting of noncompliance with Lasix and increased sodium consumption. Repeat echo done 7/28 showed decreased LVEF of 20-25%. Cardiology was consulted, who did not recommend LifeVest at this time. Instead, recommended emphasizing importance of medication compliance and close follow-up outpatient to reconsider need for LifeVest or ICD. Pt is currently on IV Lasix 40 mg BID with plan to switch to po Lasix 60 mg daily tomorrow (7/31) and continue at discharge. Also on carvedilol 6.25 mg BID, Entresto 24-26 mg BID, Farxiga 10 mg daily; on K-Cl 40 mEq daily. Do not want to start spironolactone (part of GDMT) at this time given low BP readings. Pt is down ~3.5 kg from admission weight. Cardiology  recommending 24-48 more hours of diuresis before discharge; will continue diuresing according to their plan. Appreciate cardiology's continued recommendations. PT/OT have signed off on pt. - Continue IV Lasix 40 mg BID; will transition to po Lasix 60 mg daily tomorrow (7/31) - Continue carvedilol 6.25 mg BID - Continue Entresto 24-26 mg BID - Continue K Cl 40 mEq daily - Monitor metabolic panels + Mag with goal of K > 4 and Mag > 2. - Keep off O2 supplementation as tolerated   Hx of NSTEMI Pt has hx of NSTEMI in 08/2021 and underwent cath where he was found to have L Cx lesion of 99% in the mid segment due to thrombus, no intervention at that time and pt was started on DAPT (Plavix + aspirin) for a year following. At admission, pt reported he had been off Plavix for some months. Cardiology recommending restarting Plavix at this time for indefinite DAPT treatment. - RESTART Plavix 75 mg daily - Continue aspirin 81 mg daily - Continue isosorbide dinitrate 10 mg TID  Chronic Conditions: Hypertension: Continue carvedilol 6.25 mg BID, Entresto 24-26 mg BID. Holding hydralazine. Hyperlipidemia: Continue Lipitor 80 mg daily. Prediabetes: Continue Farxiga 10 mg daily.  Code Status: Full Diet: Heart IV Fluid: None DVT Prophylaxis: Lovenox  Dispo: Cards recommending 24-48 hours more diuresis; will reassess volume status tomorrow + consult cardiology for sign-off for discharge   LOS: 2 day   Governor Rooks, Medical Student  10/11/2022, 10:46 AM

## 2022-10-11 NOTE — Progress Notes (Signed)
   Heart Failure Stewardship Pharmacist Progress Note   PCP: Annett Fabian, MD PCP-Cardiologist: Maisie Fus, MD    HPI:  47 yo M with PMH of CHF, HTN, prediabetes, CAD, obesity, and OSA.   Presented to the ED on 7/28 with shortness of breath, orthopnea, LE edema, and chest tightness. Reports he has not had his lasix since 7/25. He was instructed to double up on his lasix to 40 mg daily but the pharmacy was not able to refill the medication since it was thought he was still taking 20 mg daily (refill too soon error). CXR with cardiomegaly. CTA negative for PE. ECHO 7/28 showed LVEF 20-25% (was 30-35% in 08/2021), global hypokinesis, mild LVH, G1DD, RV normal, moderate MR.  Current HF Medications: Diuretic: furosemide 40 mg IV BID, then furosemide 60 mg PO daily starting tomorrow Beta Blocker: carvedilol 6.25 mg BID ACE/ARB/ARNI: Entresto 24/26 mg BID SGLT2i: Farxiga 10 mg daily  Prior to admission HF Medications: Diuretic: furosemide 40 mg daily Beta blocker: carvedilol 6.25 mg BID Other: hydralazine 25 mg TID + Isordil 10 mg TID *not taking Entresto or Farxiga  Pertinent Lab Values: Serum creatinine 1.10, BUN 16, Potassium 4.0, Sodium 137, BNP 218.8, Magnesium 2.1  Vital Signs: Weight: 314 lbs (admission weight: 317 lbs) Blood pressure: 120/90s  Heart rate: 80-90s  I/O: net -1.5L yesterday; net -4L since admission  Medication Assistance / Insurance Benefits Check: Does the patient have prescription insurance?  Yes Type of insurance plan: [place] [employer]  Outpatient Pharmacy:  Prior to admission outpatient pharmacy: [facility] Is the patient willing to use [facility] TOC pharmacy at discharge? Yes   Assessment: 1. Acute on chronic systolic CHF (LVEF 20-25%), due to NICM. NYHA class III symptoms. - Agree with furosemide 40 mg IV BID through today, then furosemide 60 mg PO daily starting tomorrow. Strict I/Os and daily weights. Keep K>4 and Mg>2.  - Continue carvedilol 6.25 mg BID -  Continue Entresto 24/26 mg BID - Consider starting spironolactone 12.5 mg daily if BP stable today - Continue Farxiga 10 mg daily   Plan: 1) Medication changes recommended at this time: - Start spironolactone 12.5 mg daily if BP stable  2) Patient assistance: Sherryll Burger copay $4 - Farxiga copay $4  3)  Education  - Patient has been educated on current HF medications and potential additions to HF medication regimen - Patient verbalizes understanding that over the next few months, these medication doses may change and more medications may be added to optimize HF regimen - Patient has been educated on basic disease state pathophysiology and goals of therapy   Sharen Hones, PharmD, BCPS Heart Failure Stewardship Pharmacist Phone [phone]

## 2022-10-11 NOTE — Plan of Care (Signed)
  Problem: Education: Goal: Knowledge of General Education information will improve Description Including pain rating scale, medication(s)/side effects and non-pharmacologic comfort measures Outcome: Progressing   

## 2022-10-11 NOTE — Progress Notes (Addendum)
Patient Name: CAIGE TIMMERMANN Date of Encounter: 10/11/2022 [facility] Cardiologist: Maisie Fus, MD   Interval Summary  .    47 yr old male with PMH of chronic systolic heart failure, HTN, HLD, pre-diabetes, NSTEMI 08/2021 Mid Cx 99% due to thrombus and aneurysmal, non- obstructive CAD, obesity, OSA, AAA, non-compliance, who is admitted for acute systolic heart failure.   Patient states he feels well, no longer SOB, has ambulated in the hall and tolerating activity well. He no longer takes any antiplatelet, felt lasix 40mg  daily has variable effect on his urination. He drinks a lot of fluid daily, >2L.   Vital Signs .    Vitals:   10/10/22 1943 10/11/22 0011 10/11/22 0434 10/11/22 0722  BP: 101/69 103/75 (!) 116/90 (!) 119/91  Pulse: 81 77 83 78  Resp: 17 17 18 18   Temp: 97.8 F (36.6 C) 97.7 F (36.5 C) 97.8 F (36.6 C) (!) 97.5 F (36.4 C)  TempSrc: Oral Oral Oral Oral  SpO2: 95% 93% 93% 96%  Weight:   (!) 142.6 kg   Height:        Intake/Output Summary (Last 24 hours) at 10/11/2022 0930 Last data filed at 10/11/2022 0900 Gross per 24 hour  Intake 820 ml  Output 2575 ml  Net -1755 ml      10/11/2022    4:34 AM 10/10/2022    4:11 AM 10/09/2022    3:40 PM  Last 3 Weights  Weight (lbs) 314 lb 6 oz 315 lb 7.7 oz 317 lb 14.5 oz  Weight (kg) 142.6 kg 143.1 kg 144.2 kg      Telemetry/ECG    Sinus rhythm 80s, artifacts  - Personally Reviewed  Physical Exam .   GEN: No acute distress.   Neck: No JVD Cardiac: RRR, no murmurs, rubs, or gallops.  Respiratory: Clear to auscultation bilaterally. On room air. Speaks full sentence.  GI: Soft, nontender, non-distended  MS: No leg edema  Assessment & Plan .     Acute on chronic systolic and diastolic heart failure - LVEF 32-95% on 2021 and 2023 Echo; LHC 08/16/21 for NSTEMI showed 50% dist RCA, 99% mid Cx due to heavy thrombus and aneurysmal segment, intervention was felt not possible due to angulation and  large thrombus burden, recommend medical therapy with indefinite DAPT. -presented with SOB/orthopnea/PND/mild leg edema/abdominal distention, ran out of lasix, non-compliant with HF diet/lifestyle  - BNP 218 - CXR and CTA chest no acute finding - Echo showed LVEF down to 20-25%, global hypokinesis, mild LVH, grade I DD, normal RV and PSAP, severe LAE, mild RAE, moderate MR, mild dilatation of ascending aorta 41mm.  - s/p IV Lasix 40mg  BID, weight is down from 317.9 >314.38ibs, Net-3.6L since admission, euvolemic on exam today, on Lasix 40mg  daily PTA, would increase to 60 mg daily at time of discharge  - GDMT: on PTA coreg 6.25mg  BID, farxiga 10mg , entresto 24/26mg  BID, and isordil 10mg  TID; recommend to continue same regimen, discussed the importance of compliance with heart failure fluid restriction, diet choice, etc.  - Follow up arranged on 10/24/22 with cardiology   Non-obstructive CAD NSTEMI 2023 due to Cx heavy thrombus  - see cath 2023 above  - Hs trop 28 >32, not consistent with ACS  - will resume indefinite DAPT with ASA 81mg  and plavix 75mg  daily (he had stopped both PTA), lipitor 80mg , and coreg 3.25mg  BID    HTN HLD Pre-diabetes AAA OSA - per primary team  For questions or updates, please contact [facility] Please consult [email or web address] for contact info under        Signed, Cyndi Bender, NP

## 2022-10-11 NOTE — Plan of Care (Signed)

## 2022-10-12 ENCOUNTER — Other Ambulatory Visit (HOSPITAL_COMMUNITY): Payer: Self-pay

## 2022-10-12 DIAGNOSIS — I110 Hypertensive heart disease with heart failure: Secondary | ICD-10-CM | POA: Diagnosis not present

## 2022-10-12 DIAGNOSIS — J9601 Acute respiratory failure with hypoxia: Secondary | ICD-10-CM | POA: Diagnosis not present

## 2022-10-12 DIAGNOSIS — I5023 Acute on chronic systolic (congestive) heart failure: Secondary | ICD-10-CM | POA: Diagnosis not present

## 2022-10-12 DIAGNOSIS — Z87891 Personal history of nicotine dependence: Secondary | ICD-10-CM | POA: Diagnosis not present

## 2022-10-12 DIAGNOSIS — I5043 Acute on chronic combined systolic (congestive) and diastolic (congestive) heart failure: Secondary | ICD-10-CM | POA: Diagnosis not present

## 2022-10-12 LAB — MAGNESIUM: Magnesium: 2.2 mg/dL (ref 1.7–2.4)

## 2022-10-12 MED ORDER — CLOPIDOGREL BISULFATE 75 MG PO TABS
75.0000 mg | ORAL_TABLET | Freq: Every day | ORAL | 0 refills | Status: AC
  Filled 2022-10-12: qty 30, 30d supply, fill #0

## 2022-10-12 MED ORDER — ISOSORBIDE MONONITRATE ER 30 MG PO TB24: 15.0000 mg | ORAL_TABLET | Freq: Every day | ORAL | Status: DC

## 2022-10-12 MED ORDER — POTASSIUM CHLORIDE CRYS ER 20 MEQ PO TBCR
40.0000 meq | EXTENDED_RELEASE_TABLET | Freq: Once | ORAL | Status: AC
  Administered 2022-10-12: 40 meq via ORAL
  Filled 2022-10-12: qty 2

## 2022-10-12 MED ORDER — POTASSIUM CHLORIDE CRYS ER 20 MEQ PO TBCR
40.0000 meq | EXTENDED_RELEASE_TABLET | Freq: Every day | ORAL | 0 refills | Status: DC
  Filled 2022-10-12: qty 60, 30d supply, fill #0

## 2022-10-12 MED ORDER — DAPAGLIFLOZIN PROPANEDIOL 10 MG PO TABS
10.0000 mg | ORAL_TABLET | Freq: Every day | ORAL | 0 refills | Status: AC
Start: 2022-10-13 — End: 2022-11-12
  Filled 2022-10-12: qty 30, 30d supply, fill #0

## 2022-10-12 MED ORDER — FUROSEMIDE 20 MG PO TABS
60.0000 mg | ORAL_TABLET | Freq: Every day | ORAL | 0 refills | Status: DC
  Filled 2022-10-12: qty 90, 30d supply, fill #0

## 2022-10-12 MED ORDER — ISOSORBIDE MONONITRATE ER 30 MG PO TB24
15.0000 mg | ORAL_TABLET | Freq: Every day | ORAL | 0 refills | Status: DC
  Filled 2022-10-12: qty 15, 30d supply, fill #0

## 2022-10-12 NOTE — Progress Notes (Signed)
   Heart Failure Stewardship Pharmacist Progress Note   PCP: Annett Fabian, MD PCP-Cardiologist: Maisie Fus, MD    HPI:  47 yo M with PMH of CHF, HTN, prediabetes, CAD, obesity, and OSA.   Presented to the ED on 7/28 with shortness of breath, orthopnea, LE edema, and chest tightness. Reports he has not had his lasix since 7/25. He was instructed to double up on his lasix to 40 mg daily but the pharmacy was not able to refill the medication since it was thought he was still taking 20 mg daily (refill too soon error). CXR with cardiomegaly. CTA negative for PE. ECHO 7/28 showed LVEF 20-25% (was 30-35% in 08/2021), global hypokinesis, mild LVH, G1DD, RV normal, moderate MR.  Current HF Medications: Diuretic: furosemide 60 mg PO daily Beta Blocker: carvedilol 6.25 mg BID ACE/ARB/ARNI: Entresto 24/26 mg BID SGLT2i: Farxiga 10 mg daily  Prior to admission HF Medications: Diuretic: furosemide 40 mg daily Beta blocker: carvedilol 6.25 mg BID Other: hydralazine 25 mg TID + Isordil 10 mg TID *not taking Entresto or Farxiga  Pertinent Lab Values: Serum creatinine 1.12, BUN 17, Potassium 3.4, Sodium 136, BNP 218.8, Magnesium 2.2  Vital Signs: Weight: 312 lbs (admission weight: 317 lbs) Blood pressure: 100-120/80s  Heart rate: 80-90s  I/O: net -2L yesterday; net -3.7L since admission  Medication Assistance / Insurance Benefits Check: Does the patient have prescription insurance?  Yes Type of insurance plan: [place] Medicaid  Outpatient Pharmacy:  [facility] the patient willing to use [facility] TOC pharmacy at discharge? Yes  Is the patient willing to transition to [facility] for refills? Yes - transitioned to [facility] mail order  Assessment: 1. Acute on chronic systolic CHF (LVEF 20-25%), due to NICM. NYHA class III symptoms. - Continue furosemide 60 mg PO daily. Strict I/Os and daily weights. Keep K>4 and Mg>2.  - Continue carvedilol 6.25 mg BID - Continue  Entresto 24/26 mg BID - Consider starting spironolactone 12.5 mg daily if BP stable at follow up - Continue Farxiga 10 mg daily   Plan: 1) Medication changes recommended at this time: - Start spironolactone 12.5 mg daily if BP stable at follow up  2) Patient assistance: Sherryll Burger copay $4 - Farxiga copay $4 - TOC discharge pharmacy to provide medications today, transitioning to [facility] mail order pharmacy  3)  Education  - Patient has been educated on current HF medications and potential additions to HF medication regimen - Patient verbalizes understanding that over the next few months, these medication doses may change and more medications may be added to optimize HF regimen - Patient has been educated on basic disease state pathophysiology and goals of therapy   Sharen Hones, PharmD, BCPS Heart Failure Stewardship Pharmacist Phone [phone]

## 2022-10-12 NOTE — Plan of Care (Signed)

## 2022-10-12 NOTE — Progress Notes (Signed)
DISCHARGE NOTE HOME Joseph Ray to be discharged Home per MD order. Discussed prescriptions and follow up appointments with the patient. Prescriptions given to patient; medication list explained in detail. Patient verbalized understanding.  Skin clean, dry and intact without evidence of skin break down, no evidence of skin tears noted. IV catheter discontinued intact. Site without signs and symptoms of complications. Dressing and pressure applied. Pt denies pain at the site currently. No complaints noted.  Patient free of lines, drains, and wounds.   An After Visit Summary (AVS) was printed and given to the patient. Patient escorted via wheelchair, and discharged home via private auto.  Lorine Bears, RN

## 2022-10-12 NOTE — Plan of Care (Signed)

## 2022-10-12 NOTE — TOC Transition Note (Signed)
Transition of Care [facility]) - CM/SW Discharge Note   Patient Details  Name: Joseph Ray MRN: [id number] Date of Birth: April 13, 1975  [facility] CM/SW Contact:  Leone Haven, RN Phone Number: 10/12/2022, 12:50 PM   Clinical Narrative:    Patient is for dc today, he has transportation, TOC to fill meds.    Final next level of care: Home/Self Care Barriers to Discharge: Continued Medical Work up   Patient Goals and CMS Choice   Choice offered to / list presented to : NA  Discharge Placement                         Discharge Plan and Services Additional resources added to the After Visit Summary for   In-house Referral: NA Discharge Planning Services: CM Consult Post Acute Care Choice: NA          DME Arranged: N/A DME Agency: NA       HH Arranged: NA          Social Determinants of Health (SDOH) Interventions SDOH Screenings   Food Insecurity: No Food Insecurity (10/09/2022)  Housing: Low Risk  (10/11/2022)  Transportation Needs: No Transportation Needs (10/11/2022)  Utilities: Not At Risk (10/09/2022)  Alcohol Screen: Low Risk  (10/11/2022)  Depression (PHQ2-9): Low Risk  (10/20/2021)  Financial Resource Strain: Low Risk  (10/11/2022)  Social Connections: Unknown (07/16/2021)   Received from [facility]  Tobacco Use: Medium Risk (10/09/2022)     Readmission Risk Interventions     No data to display

## 2022-10-12 NOTE — Plan of Care (Signed)
  Problem: Health Behavior/Discharge Planning: Goal: Ability to manage health-related needs will improve Outcome: Progressing   

## 2022-10-12 NOTE — Discharge Summary (Signed)
Name: Joseph Ray MRN: [id number] DOB: 1975/12/18 47 y.o. PCP: Annett Fabian, MD  Date of Admission: 10/09/2022  7:48 AM Date of Discharge:  10/12/2022 Attending Physician: Dr.  Heide Scales  DISCHARGE DIAGNOSIS:  Primary Problem: Acute exacerbation of CHF (congestive heart failure) [facility])   Hospital Problems: Principal Problem:   Acute exacerbation of CHF (congestive heart failure) (HCC) Active Problems:   Essential hypertension   Acute on chronic combined systolic and diastolic CHF (congestive heart failure) (HCC)    DISCHARGE MEDICATIONS:   Allergies as of 10/12/2022       Reactions   Penicillins Rash   Has patient had a PCN reaction causing immediate rash, facial/tongue/throat swelling, SOB or lightheadedness with hypotension: No Has patient had a PCN reaction causing severe rash involving mucus membranes or skin necrosis: NoNo Has patient had a PCN reaction that required hospitalization No Has patient had a PCN reaction occurring within the last 10 years: No If all of the above answers are "NO", then may proceed with Cephalosporin         Medication List     STOP taking these medications    hydrALAZINE 25 MG tablet Commonly known as: APRESOLINE   isosorbide dinitrate 10 MG tablet Commonly known as: ISORDIL       TAKE these medications    aspirin EC 81 MG tablet Take 1 tablet (81 mg total) by mouth daily. Swallow whole.   atorvastatin 80 MG tablet Commonly known as: LIPITOR Take 1 tablet (80 mg total) by mouth daily.   carvedilol 6.25 MG tablet Commonly known as: COREG Take 1 tablet (6.25 mg total) by mouth 2 (two) times daily with a meal.   clopidogrel 75 MG tablet Commonly known as: PLAVIX Take 1 tablet (75 mg total) by mouth daily. Start taking on: October 13, 2022   dapagliflozin propanediol 10 MG Tabs tablet Commonly known as: Marcelline Deist Take 1 tablet (10 mg total) by mouth daily before breakfast. Start taking on: October 13, 2022   Entresto  24-26 MG Generic drug: sacubitril-valsartan Take 1 tablet by mouth 2 (two) times daily.   furosemide 20 MG tablet Commonly known as: LASIX Take 3 tablets (60 mg total) by mouth daily. Take 3 tablets by mouth daily.  Please check you weight daily. If you (1) notice an increase of 2 pounds daily or 5 pounds in a week in your weight, (2) increase swelling in your legs (3) feeling short of breath when sleeping or laying flat. You can take an additional dose. If you see these changes, please call us at the IM clinic and schedule an appointment.    Max daily dose 120 mg. What changed:  medication strength See the new instructions. Another medication with the same name was removed. Continue taking this medication, and follow the directions you see here.   isosorbide mononitrate 30 MG 24 hr tablet Commonly known as: IMDUR Take 0.5 tablets (15 mg total) by mouth daily.   nitroGLYCERIN 0.4 MG SL tablet Commonly known as: NITROSTAT Place 1 tablet (0.4 mg total) under the tongue every 5 (five) minutes as needed. What changed: reasons to take this   potassium chloride SA 20 MEQ tablet Commonly known as: KLOR-CON M Take 2 tablets (40 mEq total) by mouth daily. Start taking on: October 13, 2022        DISPOSITION AND FOLLOW-UP:  [name] was discharged from [facility] in Good condition. At the hospital follow up visit please address:  A.  Acute exacerbation of congestive heart failure/ Acute hypoxic respiratory failure: Patient was discharged on the following 3/4 GDMT medications: Entresto 24/26 mg, Coreg 6.25 mg, Imdur 30 mg, Lasix 60 mg, Farxiga 10mg . Patient was notified to take 3 tablets of 20 mg Lasix once daily. If he notices any changes in his weight, increase swelling in his legs, and worsening shortness of breath, he can take extra dose. Please check patient's volume status and adherence to his medication regimen.   B. Hx of NSTEMI: Patient was restarted on  his Plavix during his hospitalization here. Per cardiology, to resume indefinite DAPT with ASA 81mg  and plavix 75mg  daily, including Lipitor 80 mg.    Follow-up Recommendations: Consults: PCP, cardiology, Advanced heart failure clinic. Labs: CBC, BMP Studies: None at this time Medications: For heart failure, please take Entresto 24/26 mg, carvedilol 6.25 mg, Lasix 60 mg, Imdur 30 mg. In addition please take potassium chloride, and continue Plavix 75 mg. Continue to take the rest of your medications for your chronic conditions as prescribed.   Follow-up Appointments:  Follow-up Information     Marcelino Duster, PA Follow up on 10/24/2022.   Specialties: Cardiology, Radiology Why: at 8:25am for your cardiology follow up appointment Contact information: [address] [place] [address] [phone]         [facility]. Go in 15 day(s).   Specialty: Cardiology Why: Hospital follow up 10/26/2022 @ 3 pm PLEASE bring a current medicationlist to appointment FREE valet parking, Entrance C, off [employer] information: [address] [place] [address] [phone]        Annett Fabian, MD Follow up on 10/24/2022.   Specialty: Internal Medicine Why: Hospital follow up. Contact information: [address] [place] [address] [phone]                 HOSPITAL COURSE:  Patient Summary:  Acute exacerbation of CHF Elevated D Dimer Previous echo done 08/15/22 showed LVEF 30-35%. Presented 7/28 with acute hypoxemia in setting of HF exacerbation after being off Lasix for 4 days and increased sodium intake. BNP of 218. Repeat echo done 7/28 showed decreased LVEF of 20-25%; cardiology did not recommend LifeVest at this time, just medical management. He was started on IV Lasix 40 mg BID with good diuresis, switched to po Lasix 60 mg daily on 7/30. He was continued on home Entresto  24-26 mg BID, carvedilol 6.25 BID, and Farxiga 10 mg daily; Imdur was decreased to 15 mg daily at discharge for compliance. He was discharged on po Lasix 60 mg daily and K Cl 40 mEq daily alongside other medications for HF. Pt was not started on spironolactone given low-normotensive BPs throughout hospitalization. Will follow with cardiology outpatient and has appoint on 10/24/22 at 8:45 am with Micah Flesher at [facility]. He will also follow with the heart failure team at the Advanced Heart failure clinic appointment on 10/26/2022 at 3pm.   Hx of NSTEMI Pt has hx of NSTEMI in 08/2021 and underwent cath where he was found to have L Cx lesion of 99% in the mid segment due to thrombus, no intervention at that time and pt was started on DAPT (Plavix + aspirin) for a year following. At admission, pt reported he had been off Plavix for some months. Cardiology recommended restarting Plavix during hospitalization for indefinite DAPT treatment; pt on aspirin 81 mg daily and Plavix 75 mg daily.  Mild Ascending Aortic Aneurysm Echo done 10/09/22 showed mild dilatation of the ascending aorta, measuring 41 mm. Will follow with cardiology outpatient.    DISCHARGE INSTRUCTIONS:  Mr. Delamater,   It was a pleasure caring for you during this hospitalization! You came into the hospital with shortness of breath and were found to be in a heart failure exacerbation. We think this exacerbation was due to not being able to take your Lasix for a few days along with eating a lot of salt. We treated this with IV diuresis and you felt better afterwards!   During your hospitalization, you were also found to have a reduced EF (or pumping function of your heart) of 20-25%; normal is around 55%. Because your pumping function is so low, it is very important for you to take all your medications as prescribed. You will see both the cardiologist and the heart failure teams outpatient, as well as following up in  the Internal Medicine clinic; dates/times for all of these appointments are below.  There are a few things for you to do after leaving the hospital: Please take all your medications as prescribed!  Weigh yourself daily. If your weight increases by more than 2 lbs in a day or 5 lbs over a week, you can take an additional dose of Lasix (3 tablets for 60 mg total); please then call the Internal Medicine clinic at [phone]. If you feel shortness of breath, especially with activity, or notice increased swelling/edema, you can take an additional dose of Lasix (3 tablets for 60 mg total); please then call the Internal Medicine clinic at [phone]. Limit the amount of fluid you drink and limit the amount of salt you eat. You have a cardiology follow-up appointment with Micah Flesher, PA, on 10/24/22 at 8:45 am at [facility]. You have a heart failure follow-up appointment at the Advanced Heart failure clinic on 10/26/22 at 3pm.  You had some medication changes during your hospitalization: For your heart failure: - Take Coreg (carvedilol) 6.25 mg two times a day with meals - Take Entresto (sacubitril-valsartan) one tablet (24-26 mg) two times a day - Take Imdur 15 mg once a day - Take Farxiga (dapaglifozin) 10 mg once a day before breakfast; this also helps with your diabetes - Take Lasix (furosemide) 60 mg daily once a day; 60 mg will be taking three pills altogether (which are 20 mg each) - Take potassium chloride 40 mEq once a day  For your history of heart attack: - Take Plavix 75 mg once a day - Take aspirin 81 mg once a day - Take Lipitor (atorvastatin) 80 mg once a day; this also helps with your high cholesterol levels  We are so glad you are feeling better! Please call the Internal Medicine clinic at [phone] if you begin to experience shortness of breath or sudden weight gain; you can also present to the Emergency Room for this if you cannot get in touch  with the clinic.  Thank you for allowing [place] to be part of your care team! Governor Rooks, medical student Dr. Jeral Pinch, PGY1 Dr. Rudene Christians, PGY3  SUBJECTIVE:  Patient examined at bedside today, alert and oriented, hemodynamically stable.  Patient reports he is feeling great today. Denies any chest pain, shortness of breath, palpitations. Does report symptoms of slight dizziness and headache after taking Entresto. Otherwise, no other acute complaints or concerns for today.  Cardiology saw him this morning and told the patient that he is  ready to be discharged today.  Discharge Vitals:   BP 99/82 (BP Location: Left Arm)   Pulse (!) 31   Temp 97.8 F (36.6 C) (Oral)   Resp 18   Ht 6\' 3"  (1.905 m)   Wt (!) 141.7 kg   SpO2 95%   BMI 39.05 kg/m   OBJECTIVE:  Physical Exam   General: Pt is in hospital bed with head of bed raised, no acute distress, resting comfortably. Cardiovascular: Regular rate, no murmurs, rubs, gallops.  No lower extremity edema appreciated.  2+ dp and radial pulse intact bilaterally. Pulmonary: Normal work of breathing. Lungs clear to auscultation bilaterally. Abdomen: Normal bowel sounds. Obese but soft. No tenderness to palpation; no rebound tenderness, no guarding. MSK: Normal range of motion. Neuro: No focal deficit, alert and oriented name, location, and event.  Psych: Normal mood and affect.    Pertinent Labs, Studies, and Procedures:     Latest Ref Rng & Units 10/10/2022    5:58 AM 10/09/2022    7:57 AM 04/11/2022   12:09 AM  CBC  WBC 4.0 - 10.5 K/uL 4.9  6.0  8.4   Hemoglobin 13.0 - 17.0 g/dL 40.9  81.1  91.4   Hematocrit 39.0 - 52.0 % 41.4  36.4  41.4   Platelets 150 - 400 K/uL 227  187  231        Latest Ref Rng & Units 10/12/2022    1:18 AM 10/11/2022    6:12 AM 10/11/2022    2:52 AM  CMP  Glucose 70 - 99 mg/dL 782  956  213   BUN 6 - 20 mg/dL 17  16  16    Creatinine 0.61 - 1.24 mg/dL 0.86  5.78  4.69   Sodium 135 - 145 mmol/L  136  137  137   Potassium 3.5 - 5.1 mmol/L 3.4  4.0  3.8   Chloride 98 - 111 mmol/L 101  103  102   CO2 22 - 32 mmol/L 24  23  25    Calcium 8.9 - 10.3 mg/dL 9.0  9.1  9.1     CT Angio Chest Pulmonary Embolism (PE) W or WO Contrast  Result Date: 10/09/2022 CLINICAL DATA:  Positive D-dimer, short of breath EXAM: CT ANGIOGRAPHY CHEST WITH CONTRAST TECHNIQUE: Multidetector CT imaging of the chest was performed using the standard protocol during bolus administration of intravenous contrast. Multiplanar CT image reconstructions and MIPs were obtained to evaluate the vascular anatomy. RADIATION DOSE REDUCTION: This exam was performed according to the departmental dose-optimization program which includes automated exposure control, adjustment of the mA and/or kV according to patient size and/or use of iterative reconstruction technique. CONTRAST:  75mL OMNIPAQUE IOHEXOL 350 MG/ML SOLN COMPARISON:  04/11/2022, 10/09/2022 FINDINGS: Cardiovascular: This is a technically adequate evaluation of the pulmonary vasculature. No filling defects or pulmonary emboli. The heart is enlarged, with prominent left ventricular dilatation. No pericardial effusion. Normal caliber of the thoracic aorta. 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 Mediastinum/Nodes: No enlarged mediastinal, hilar, or axillary lymph nodes. Thyroid gland, trachea, and esophagus demonstrate no significant findings. Lungs/Pleura: No acute airspace disease, effusion, or pneumothorax. Central airways are patent. Upper Abdomen: No acute abnormality. Musculoskeletal: No acute or destructive bony abnormalities. Reconstructed images demonstrate no additional findings. Review of the MIP images confirms the above findings. IMPRESSION: 1. No evidence of pulmonary embolus. 2. Cardiomegaly. 3. No acute intrathoracic process. Electronically Signed   By: Maxwell Caul.D.  On: 10/09/2022 23:28   ECHOCARDIOGRAM  COMPLETE  Result Date: 10/09/2022    ECHOCARDIOGRAM REPORT   Patient Name:   Joseph Ray Date of Exam: 10/09/2022 Medical Rec #:  [id number]      Height:       75.0 in Accession #:    [id number]     Weight:       314.0 lb Date of Birth:  03-Dec-1975      BSA:          2.658 m Patient Age:    46 years       BP:           138/106 mmHg Patient Gender: M              HR:           90 bpm. Exam Location:  Inpatient Procedure: 2D Echo, Cardiac Doppler and Color Doppler Indications:    Acute systolic chf  History:        Patient has prior history of Echocardiogram examinations, most                 recent 08/14/2021. CHF, ascending aortic aneurysm; Risk                 Factors:Hypertension, Dyslipidemia and Diabetes.  Sonographer:    Delcie Roch RDCS Referring Phys: [id number] RHONDA G BARRETT IMPRESSIONS  1. Left ventricular ejection fraction, by estimation, is 20 to 25%. The left ventricle has severely decreased function. The left ventricle demonstrates global hypokinesis. The left ventricular internal cavity size was moderately dilated. There is mild left ventricular hypertrophy. Left ventricular diastolic parameters are consistent with Grade I diastolic dysfunction (impaired relaxation).  2. Right ventricular systolic function is normal. The right ventricular size is normal. There is normal pulmonary artery systolic pressure.  3. Left atrial size was severely dilated.  4. Right atrial size was mildly dilated.  5. The mitral valve is normal in structure. Moderate mitral valve regurgitation. No evidence of mitral stenosis.  6. The aortic valve is tricuspid. Aortic valve regurgitation is not visualized. No aortic stenosis is present.  7. Aortic dilatation noted. There is mild dilatation of the ascending aorta, measuring 41 mm.  8. The inferior vena cava is normal in size with greater than 50% respiratory variability, suggesting right atrial pressure of 3 mmHg. Comparison(s): Prior images reviewed side by side. FINDINGS   Left Ventricle: Left ventricular ejection fraction, by estimation, is 20 to 25%. The left ventricle has severely decreased function. The left ventricle demonstrates global hypokinesis. The left ventricular internal cavity size was moderately dilated. There is mild left ventricular hypertrophy. Left ventricular diastolic parameters are consistent with Grade I diastolic dysfunction (impaired relaxation). Right Ventricle: The right ventricular size is normal. No increase in right ventricular wall thickness. Right ventricular systolic function is normal. There is normal pulmonary artery systolic pressure. The tricuspid regurgitant velocity is 1.78 m/s, and  with an assumed right atrial pressure of 3 mmHg, the estimated right ventricular systolic pressure is 15.7 mmHg. Left Atrium: Left atrial size was severely dilated. Right Atrium: Right atrial size was mildly dilated. Pericardium: There is no evidence of pericardial effusion. Mitral Valve: The mitral valve is normal in structure. Moderate mitral valve regurgitation. No evidence of mitral valve stenosis. Tricuspid Valve: The tricuspid valve is normal in structure. Tricuspid valve regurgitation is not demonstrated. No evidence of tricuspid stenosis. Aortic Valve: The aortic valve is tricuspid. Aortic valve regurgitation is not visualized. No aortic  stenosis is present. Pulmonic Valve: The pulmonic valve was normal in structure. Pulmonic valve regurgitation is not visualized. No evidence of pulmonic stenosis. Aorta: Aortic dilatation noted. There is mild dilatation of the ascending aorta, measuring 41 mm. Venous: The inferior vena cava is normal in size with greater than 50% respiratory variability, suggesting right atrial pressure of 3 mmHg. IAS/Shunts: No atrial level shunt detected by color flow Doppler.  LEFT VENTRICLE PLAX 2D LVIDd:         7.00 cm      Diastology LVIDs:         6.00 cm      LV e' medial:  5.44 cm/s LV PW:         1.40 cm      LV e' lateral: 4.90  cm/s LV IVS:        1.30 cm LVOT diam:     2.30 cm LV SV:         54 LV SV Index:   20 LVOT Area:     4.15 cm  LV Volumes (MOD) LV vol d, MOD A2C: 301.0 ml LV vol s, MOD A2C: 237.0 ml LV SV MOD A2C:     64.0 ml RIGHT VENTRICLE             IVC RV Basal diam:  3.40 cm     IVC diam: 1.80 cm RV S prime:     10.80 cm/s TAPSE (M-mode): 1.9 cm LEFT ATRIUM              Index        RIGHT ATRIUM           Index LA diam:        6.00 cm  2.26 cm/m   RA Area:     19.90 cm LA Vol (A2C):   153.0 ml 57.56 ml/m  RA Volume:   58.10 ml  21.86 ml/m LA Vol (A4C):   105.0 ml 39.50 ml/m LA Biplane Vol: 131.0 ml 49.28 ml/m  AORTIC VALVE LVOT Vmax:   78.90 cm/s LVOT Vmean:  51.300 cm/s LVOT VTI:    0.131 m  AORTA Ao Root diam: 3.80 cm Ao Asc diam:  4.10 cm MR Peak grad: 96.0 mmHg   TRICUSPID VALVE MR Mean grad: 63.0 mmHg   TR Peak grad:   12.7 mmHg MR Vmax:      490.00 cm/s TR Vmax:        178.00 cm/s MR Vmean:     372.0 cm/s                           SHUNTS                           Systemic VTI:  0.13 m                           Systemic Diam: 2.30 cm Donato Schultz MD Electronically signed by Donato Schultz MD Signature Date/Time: 10/09/2022/3:26:58 PM    Final    DG Chest Port 1 View  Result Date: 10/09/2022 CLINICAL DATA:  47 year old male with shortness of breath. EXAM: PORTABLE CHEST 1 VIEW COMPARISON:  Chest radiographs 05/01/2022 and earlier. FINDINGS: Portable AP view at 0838 hours. Cardiomegaly, confirmed by CTA in January this year. Stable cardiac size and mediastinal contours. Mildly lower lung volumes. Visualized tracheal air column is within  normal limits. Allowing for portable technique the lungs are clear. No pneumothorax or pleural effusion. No acute osseous abnormality identified. IMPRESSION: Stable cardiomegaly. No acute cardiopulmonary abnormality. Electronically Signed   By: Odessa Fleming M.D.   On: 10/09/2022 08:58     Signed: Jeral Pinch, D.O.  Internal Medicine Resident, PGY-1 [facility]  Pager: [phone] 12:12 PM, 10/12/2022

## 2022-10-12 NOTE — Progress Notes (Signed)
   Patient Name: Joseph Ray Date of Encounter: 10/12/2022 [facility] Cardiologist: Maisie Fus, MD   Subjective:  improved breathing less edema ready for d/c   Vital Signs .    Vitals:   10/12/22 0043 10/12/22 0435 10/12/22 0555 10/12/22 0730  BP:  117/88  99/82  Pulse: 72 76  (!) 31  Resp:  20  18  Temp: 97.8 F (36.6 C) 98.8 F (37.1 C)  97.8 F (36.6 C)  TempSrc: Oral Oral  Oral  SpO2: 96% 95%    Weight:   (!) 141.7 kg   Height:        Intake/Output Summary (Last 24 hours) at 10/12/2022 0926 Last data filed at 10/12/2022 0700 Gross per 24 hour  Intake 1560 ml  Output 1650 ml  Net -90 ml      10/12/2022    5:55 AM 10/11/2022    4:34 AM 10/10/2022    4:11 AM  Last 3 Weights  Weight (lbs) 312 lb 6.4 oz 314 lb 6 oz 315 lb 7.7 oz  Weight (kg) 141.704 kg 142.6 kg 143.1 kg      Telemetry/ECG    Sinus rhythm 80s, artifacts  - Personally Reviewed  Physical Exam .   GEN: No acute distress.   Neck: No JVD Cardiac: RRR, no murmurs, rubs, or gallops.  Respiratory: Clear to auscultation bilaterally. On room air. Speaks full sentence.  GI: Soft, nontender, non-distended  MS: No leg edema  Assessment & Plan .     Acute on chronic systolic and diastolic heart failure - LVEF 16-10% on 2021 and 2023 Echo; LHC 08/16/21 for NSTEMI showed 50% dist RCA, 99% mid Cx due to heavy thrombus and aneurysmal segment, intervention was felt not possible due to angulation and large thrombus burden, recommend medical therapy with indefinite DAPT. -presented with SOB/orthopnea/PND/mild leg edema/abdominal distention, ran out of lasix, non-compliant with HF diet/lifestyle  - BNP 218 - CXR and CTA chest no acute finding - Echo showed LVEF down to 20-25%, global hypokinesis, mild LVH, grade I DD, normal RV and PSAP, severe LAE, mild RAE, moderate MR, mild dilatation of ascending aorta 41mm.  - s/p IV Lasix 40mg  BID, weight is down from 317.9 >314.38ibs, Net-3.6L since admission,  euvolemic on exam today, on Lasix 40mg  daily PTA, D/C 60 mg daily lasix - GDMT: on PTA coreg 6.25mg  BID, farxiga 10mg , entresto 24/26mg  BID, and isordil 10mg  TID; recommend change to imdur 15 mg daily not ideal to have tid med with compliance issues discussed the importance of compliance with heart failure fluid restriction, diet choice, etc.  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

## 2022-10-14 DIAGNOSIS — G4733 Obstructive sleep apnea (adult) (pediatric): Secondary | ICD-10-CM | POA: Diagnosis not present

## 2022-10-15 NOTE — Progress Notes (Deleted)
Cardiology Office Note:    Date:  10/15/2022   ID:  Joseph Ray, DOB 05-07-1975, MRN [id number]  PCP:  Annett Fabian, MD   [facility] Providers Cardiologist:  Maisie Fus, MD { Click to update primary MD,subspecialty MD or APP then REFRESH:1}    Referring MD: Annett Fabian, MD   No chief complaint on file. ***  History of Present Illness:    Joseph Ray is a 47 y.o. male with a hx of chronic combined systolic and diastolic heart failure, hypertension, prediabetes, CAD with NSTEMI 2023 recommended for medical therapy, obesity, and OSA.  He was initially diagnosed with acute systolic heart failure in 02/2020 in the setting of COVID-19 infection, LVEF 30-35% with grade 1 DD.  GDMT was initiated however due to insurance issues he was not able to get Entresto or carvedilol.  Unfortunately he did not follow-up with cardiology.  He then established care with [facility] cardiology 08/28/2020 and was on Coreg and lisinopril.  Workup for cardiomyopathy included CT coronary 12/01/2020 that showed a calcium score of 32 placing him at the 77th percentile.  No significant CAD.  RCA felt aneurysmal at the proximal section.  Unfortunately he did not follow-up with [facility] cardiology.  He was hospitalized 08/13/2021 for shortness of breath.  He had lost his job in February and had been out of all of his medications since March.  Troponin was elevated and he reported chest pressure.  Echocardiogram 08/2021 with an LVEF 30-35%, grade 2 DD, mild to moderate MR, ascending aortic aneurysm of 4.3 cm.  Heart catheterization 08/16/21 showed proximal aneurysm of the RCA, 99% mid LCx with heavy thrombus and aneurysmal segment felt to be the culprit lesion but not a candidate for PCI due to angulation of the vessel.  The large aneurysmal segment appeared to be filled with thrombus.  He was treated with DAPT with aspirin and Plavix and aggressive prevention was recommended.  He was hospitalized 10/01/2022 with CHF  exacerbation after running out of Lasix tablets.  He increased his dose due to symptoms of volume overload.  Unfortunately he ran out of Lasix and was unable to get a refill sent in.  He also had dietary indiscretion during this time.  He was admitted and underwent aggressive IV diuresis. Echo with LVEF further reduced to 20-25% with global hypokinesis and moderate MR, mild dilatation of ascending aorta 41 mm.  He was discharged on aspirin, Plavix, carvedilol 6.25 mg twice daily, Farxiga 10 mg, 15 mg Imdur, and 24-26 mg Entresto.  He presents today for hospital follow-up.   Chronic systolic and diastolic heart failure ?Ischemic cardiomyopathy Mild to moderate MR Hypertension Noncompliance due to insurance losses - heart failure initially felt NICM, but heart cath last year with 99% stenosis in the LCX with aneurysm and thrombus - GDMT limited by access/insurance -Continue carvedilol, Farxiga, Imdur, and Entresto   CAD - aneurysmal coronaries with thrombus burden - not amenable to PCI - ASA and plavix indefinitely   Hyperlipidemia with LDL goal < 70 - Needs updated lipid panel - continue statin   AAA 4.1 cm on echo, repeat echo in 09/2023     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 08/13/2021   Need for assessment for sleep  apnea    NSTEMI (non-ST elevated myocardial infarction) [facility])    Prediabetes     Past Surgical History:  Procedure Laterality Date   ACHILLES TENDON SURGERY Left 03/25/2013   Procedure: LEFT ACHILLES TENDON REPAIR;  Surgeon: [name], MD;  Location: [facility];  Service: Orthopedics;  Laterality: Left;   LEFT HEART CATH AND CORONARY ANGIOGRAPHY N/A 08/16/2021   Procedure: LEFT HEART CATH AND CORONARY ANGIOGRAPHY;   Surgeon: [name], MD;  Location: [facility] INVASIVE CV LAB;  Service: Cardiovascular;  Laterality: N/A;    Current Medications: No outpatient medications have been marked as taking for the 10/24/22 encounter (Appointment) with Marcelino Duster, PA.     Allergies:   Penicillins   Social History   Socioeconomic History   Marital status: Single    Spouse name: Not on file   Number of children: 3   Years of education: Not on file   Highest education level: High school graduate  Occupational History   Not on file  Tobacco Use   Smoking status: Former    Current packs/day: 0.15    Average packs/day: 0.2 packs/day for 15.0 years (2.3 ttl pk-yrs)    Types: Cigarettes   Smokeless tobacco: Never   Tobacco comments:    Stopped smoking in May 2023.  Vaping Use   Vaping status: Never Used  Substance and Sexual Activity   Alcohol use: Not Currently   Drug use: No   Sexual activity: Not on file  Other Topics Concern   Not on file  Social History Narrative   Not on file   Social Determinants of Health   Financial Resource Strain: Low Risk  (10/11/2022)   Overall Financial Resource Strain (CARDIA)    Difficulty of Paying Living Expenses: Not very hard  Food Insecurity: No Food Insecurity (10/09/2022)   Hunger Vital Sign    Worried About Running Out of Food in the Last Year: Never true    Ran Out of Food in the Last Year: Never true  Transportation Needs: No Transportation Needs (10/11/2022)   PRAPARE - Administrator, Civil Service (Medical): No    Lack of Transportation (Non-Medical): No  Physical Activity: Not on file  Stress: Not on file  Social Connections: Unknown (07/16/2021)   Received from [facility]   Social Network    Social Network: Not on file     Family History: The patient's ***family history includes Cancer in his paternal aunt; Heart disease in his father and paternal grandfather; Hypertension in his brother, father, mother, and paternal  grandfather.  ROS:   Please see the history of present illness.    *** All other systems reviewed and are negative.  EKGs/Labs/Other Studies Reviewed:    The following studies were reviewed today:  Echo 09/2022: 1. Left ventricular ejection fraction, by estimation, is 20 to 25%. The  left ventricle has severely decreased function. The left ventricle  demonstrates global hypokinesis. The left ventricular internal cavity size  was moderately dilated. There is mild  left ventricular hypertrophy. Left ventricular diastolic parameters are  consistent with Grade I diastolic dysfunction (impaired relaxation).   2. Right ventricular systolic function is normal. The right ventricular  size is normal. There is normal pulmonary artery systolic pressure.   3. Left atrial size was severely dilated.   4. Right atrial size was mildly dilated.   5. The mitral valve is normal in structure. Moderate mitral valve  regurgitation. No evidence of mitral  stenosis.   6. The aortic valve is tricuspid. Aortic valve regurgitation is not  visualized. No aortic stenosis is present.   7. Aortic dilatation noted. There is mild dilatation of the ascending  aorta, measuring 41 mm.   8. The inferior vena cava is normal in size with greater than 50%  respiratory variability, suggesting right atrial pressure of 3 mmHg.    LHC 08/16/21:   Dist RCA lesion is 50% stenosed, after more proximal aneurysm.   Mid Cx lesion is 99% stenosed.  Heavy thrombus and an aneurysmal segment.  Culprit, but not a candudate for PCI.   LV end diastolic pressure is mildly elevated.   There is no aortic valve stenosis.   Severe, right subclavian tortuosity which made torquing catheters somewhat difficult.  The left system was straightforward to engage, but the RCA was more difficult, but ultimately successful.  Could consider destination sheath if using the right radial approach in the future, but this was not needed today.   Aneurysmal  segments in the LAD, proximal to mid RCA and mid circumflex after large obtuse marginal.  The mid circumflex appears to be the culprit.  This large aneurysmal segment appears to be filled with thrombus.  There is some flow through the circumflex to fill the distal vessel.  There severe retroflexion of the circumflex as well.  I do not think intervention would be possible given the angulation and the large burden of thrombus.  He has not had pain in 2 days.  Would treat with dual antiplatelet therapy and aggressive secondary prevention, along with medical therapy for his heart failure.  No other severe disease noted.   Consider indefinite dual antiplatelet therapy.         Recent Labs: 10/09/2022: ALT 34; B Natriuretic Peptide 218.8 10/10/2022: Hemoglobin 13.8; Platelets 227 10/12/2022: BUN 17; Creatinine, Ser 1.12; Magnesium 2.2; Potassium 3.4; Sodium 136  Recent Lipid Panel    Component Value Date/Time   CHOL 193 03/12/2020 0455   TRIG 152 (H) 03/12/2020 0455   HDL 26 (L) 03/12/2020 0455   CHOLHDL 7.4 03/12/2020 0455   VLDL 30 03/12/2020 0455   LDLCALC 137 (H) 03/12/2020 0455     Risk Assessment/Calculations:   {Does this patient have ATRIAL FIBRILLATION?:[phone]}  No BP recorded.  {Refresh Note OR Click here to enter BP  :1}***         Physical Exam:    VS:  There were no vitals taken for this visit.    Wt Readings from Last 3 Encounters:  10/12/22 (!) 312 lb 6.4 oz (141.7 kg)  06/29/22 (!) 314 lb (142.4 kg)  05/29/22 (!) 312 lb (141.5 kg)     GEN: *** Well nourished, well developed in no acute distress HEENT: Normal NECK: No JVD; No carotid bruits LYMPHATICS: No lymphadenopathy CARDIAC: ***RRR, no murmurs, rubs, gallops RESPIRATORY:  Clear to auscultation without rales, wheezing or rhonchi  ABDOMEN: Soft, non-tender, non-distended MUSCULOSKELETAL:  No edema; No deformity  SKIN: Warm and dry NEUROLOGIC:  Alert and oriented x 3 PSYCHIATRIC:  Normal affect    ASSESSMENT:    No diagnosis found. PLAN:    In order of problems listed above:  ***      {Are you ordering a CV Procedure (e.g. stress test, cath, DCCV, TEE, etc)?   Press F2        :[id number]}    Medication Adjustments/Labs and Tests Ordered: Current medicines are reviewed at length with the patient today.  Concerns regarding medicines are  outlined above.  No orders of the defined types were placed in this encounter.  No orders of the defined types were placed in this encounter.   There are no Patient Instructions on file for this visit.   Signed, Marcelino Duster, PA  10/15/2022 3:00 PM    [facility]

## 2022-10-21 NOTE — Progress Notes (Signed)
Patient has obesity.

## 2022-10-24 ENCOUNTER — Ambulatory Visit: Payer: BC Managed Care – PPO | Attending: Physician Assistant | Admitting: Physician Assistant

## 2022-10-24 ENCOUNTER — Encounter: Payer: Self-pay | Admitting: Physician Assistant

## 2022-10-24 ENCOUNTER — Encounter: Payer: BC Managed Care – PPO | Admitting: Internal Medicine

## 2022-10-24 NOTE — Progress Notes (Incomplete)
HEART & VASCULAR TRANSITION OF CARE CONSULT NOTE   Referring Physician: Dr. Eden Emms Primary Care: Annett Fabian, MD Primary Cardiologist: Dr. Wyline Mood  HPI: Referred to clinic by Dr. Eden Emms for heart failure consultation.   Joseph Ray is a 47 y.o. male with a hx of chronic combined CHF, HTN, CAD, obesity, and OSA.  Admitted 6/23 with nSTEMI. LHC showed persistent thrombus in left circumflex, not amenable to PCI. Treated medically with indefinite DAPT. Echo showed EF 30-35%.   Followed closely with Dr. Wyline Mood, with plans for repeat echo and referral to EP for ICD if EF not > 35%.   Admitted 7/24 with a/c HF, 2/2 medication and lifestyle noncompliance. Diuresed with IV lasix. Echo showed EF 20-25%, mild LVH, G1DD, normal RV, moderate MR. Did not pursue ischemic eval as he did not have CP or acute ECG changes. GDMT titrated and he was discharged home, weight 312 lbs.  Today he presents to [facility] for post-hospital HF follow up. Overall feeling fine. He walks 1 mile/day. He is not SOB with activity. He has occasional dizziness, and feels palpitations. Denies CP, edema, or PND/Orthopnea. Appetite ok. No fever or chills. Weight at home  310 pounds. Taking all medications. Limiting fluids to < 1/2 gal. He wears CPAP. BP home ~ 120s/70s. Stopped smoking tobacco/THC, ETOH > 1 year ago.   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, single, he has 3 boys  ECG (personally reviewed):ST with PVCs  ReDs: 41%  Labs (7/24): K 3.4, creatinine 1.12  Cardiac Testing   - Echo (7/24): EF 20-25%, mild LVH, G1DD  - LHC (6/23): persistent thrombus in the left circumflex not amenable to PCI,   - Echo (6/23): EF 30-35%, G2DD, RV normal, mild to moderate MR  - Ltd echo (12/21): EF 30-35%, moderate LVH, G1DD  Review of Systems: [y] = yes, [ ]  = no   General: Weight gain [ ] ; Weight loss [ ] ; Anorexia [ ] ; Fatigue [ ] ; Fever [ ] ; Chills [ ] ;  Weakness [ ]   Cardiac: Chest pain/pressure [ ] ; Resting SOB [ ] ; Exertional SOB [ ] ; Orthopnea [ ] ; Pedal Edema [ ] ; Palpitations [ ] ; Syncope [ ] ; Presyncope [ ] ; Paroxysmal nocturnal dyspnea[ ]   Pulmonary: Cough [ ] ; Wheezing[ ] ; Hemoptysis[ ] ; Sputum [ ] ; Snoring [name] ]  GI: Vomiting[ ] ; Dysphagia[ ] ; Melena[ ] ; Hematochezia [ ] ; Heartburn[ ] ; Abdominal pain [ ] ; Constipation [ ] ; Diarrhea [ ] ; BRBPR [ ]   GU: Hematuria[ ] ; Dysuria [ ] ; Nocturia[ ]   Vascular: Pain in legs with walking [ ] ; Pain in feet with lying flat [ ] ; Non-healing sores [ ] ; Stroke [ ] ; TIA [ ] ; Slurred speech [ ] ;  Neuro: Headaches[ ] ; Vertigo[ ] ; Seizures[ ] ; Paresthesias[ ] ;Blurred vision [ ] ; Diplopia [ ] ; Vision changes [ ]   Ortho/Skin: Arthritis [ ] ; Joint pain [ ] ; Muscle pain [ ] ; Joint swelling [ ] ; Back Pain [ ] ; Rash [ ]   Psych: Depression[ ] ; Anxiety[ ]   Heme: Bleeding problems [ ] ; Clotting disorders [ ] ; Anemia [ ]   Endocrine: Diabetes [y]; Thyroid dysfunction[ ]   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 08/13/2021   Need for assessment for sleep  apnea    NSTEMI (non-ST elevated myocardial infarction) (HCC)    Prediabetes    Current Outpatient Medications  Medication Sig Dispense Refill   aspirin EC 81 MG tablet Take 1 tablet (81 mg total) by mouth daily. Swallow whole. 30 tablet 12   atorvastatin (LIPITOR) 80 MG tablet Take 1 tablet (80 mg total) by mouth daily. 90 tablet 2   carvedilol (COREG) 6.25 MG tablet Take 1 tablet (6.25 mg total) by mouth 2 (two) times daily with a meal. 180 tablet 2   clopidogrel (PLAVIX) 75 MG tablet Take 1 tablet (75 mg total) by mouth daily. 30 tablet 0   dapagliflozin propanediol (FARXIGA) 10 MG TABS tablet Take 1 tablet (10 mg total) by mouth daily before  breakfast. 30 tablet 0   furosemide (LASIX) 20 MG tablet Take 3 tablets (60 mg total) by mouth daily. Please check you weight daily. If you (1) notice an increase of 2 pounds daily or 5 pounds in a week in your weight, (2) increase swelling in your legs (3) feeling short of breath when sleeping or laying flat. You can take an additional dose. If you see these changes, please call us at the IM clinic and schedule an appointment.    Max daily dose 120 mg. 180 tablet 0   isosorbide mononitrate (IMDUR) 30 MG 24 hr tablet Take 0.5 tablets (15 mg total) by mouth daily. 30 tablet 0   sacubitril-valsartan (ENTRESTO) 24-26 MG Take 1 tablet by mouth 2 (two) times daily. 60 tablet 2   spironolactone (ALDACTONE) 25 MG tablet Take 1 tablet (25 mg total) by mouth daily. 30 tablet 5   nitroGLYCERIN (NITROSTAT) 0.4 MG SL tablet Place 1 tablet (0.4 mg total) under the tongue every 5 (five) minutes as needed. (Patient taking differently: Place 0.4 mg under the tongue every 5 (five) minutes as needed for chest pain.) 25 tablet 3   potassium chloride SA (KLOR-CON M) 20 MEQ tablet Take 1 tablet (20 mEq total) by mouth daily. 30 tablet 5   No current facility-administered medications for this encounter.   Allergies  Allergen Reactions   Penicillins Rash    Has patient had a PCN reaction causing immediate rash, facial/tongue/throat swelling, SOB or lightheadedness with hypotension: NoNo Has patient had a PCN reaction causing severe rash involving mucus membranes or skin necrosis: NoNo Has patient had a PCN reaction that required hospitalization NoNo Has patient had a PCN reaction occurring within the last 10 years: NoNo If all of the above answers are "NO", then may proceed with Cephalosporin    Social History   Socioeconomic History   Marital status: Single    Spouse name: Not on file   Number of children: 3   Years of education: Not on file   Highest education level: High school graduate  Occupational  History   Not on file  Tobacco Use   Smoking status: Former    Current packs/day: 0.15    Average packs/day: 0.2 packs/day for 15.0 years (2.3 ttl pk-yrs)    Types: Cigarettes   Smokeless tobacco: Never   Tobacco comments:    Stopped smoking in May 2023.  Vaping Use   Vaping status: Never Used  Substance and Sexual Activity   Alcohol use: Not Currently   Drug use: No   Sexual activity: Not on file  Other Topics Concern   Not on file  Social History Narrative   Not on file   Social Determinants of Health   Financial Resource  Strain: Low Risk  (10/11/2022)   Overall Financial Resource Strain (CARDIA)    Difficulty of Paying Living Expenses: Not very hard  Food Insecurity: No Food Insecurity (10/09/2022)   Hunger Vital Sign    Worried About Running Out of Food in the Last Year: Never true    Ran Out of Food in the Last Year: Never true  Transportation Needs: No Transportation Needs (10/11/2022)   PRAPARE - Administrator, Civil Service (Medical): No    Lack of Transportation (Non-Medical): No  Physical Activity: Not on file  Stress: Not on file  Social Connections: Unknown (07/16/2021)   [facility]   Social Network    Social Network: Not on file  Intimate Partner Violence: Not At Risk (10/09/2022)   Humiliation, Afraid, Rape, and Kick questionnaire    Fear of Current or Ex-Partner: No    Emotionally Abused: No    Physically Abused: No    Sexually Abused: No   Family History  Problem Relation Age of Onset   Hypertension Mother    Heart disease Father    Hypertension Father    Hypertension Brother    Cancer Paternal Aunt    Hypertension Paternal Grandfather    Heart disease Paternal Grandfather    BP 126/82   Pulse (!) 53   Wt (!) 141.9 kg (312 lb 12.8 oz)   SpO2 94%   BMI 39.10 kg/m   Wt Readings from Last 3 Encounters:  10/26/22 (!) 141.9 kg (312 lb 12.8 oz)  10/12/22 (!) 141.7 kg (312 lb 6.4 oz)  06/29/22 (!) 142.4 kg (314 lb)    PHYSICAL EXAM: General:  NAD. No resp difficulty, walked into clinic HEENT: Normal Neck: Supple. No JVD. Carotids 2+ bilat; no bruits. No lymphadenopathy or thryomegaly appreciated. Cor: PMI nondisplaced. Tachy irregular rate (PVCs) & rhythm. No rubs, gallops or murmurs. Lungs: Clear Abdomen: Soft, obese, nontender, nondistended. No hepatosplenomegaly. No bruits or masses. Good bowel sounds. Extremities: No cyanosis, clubbing, rash, edema Neuro: Alert & oriented x 3, cranial nerves grossly intact. Moves all 4 extremities w/o difficulty. Affect pleasant.  ASSESSMENT & PLAN: Chronic Systolic Heart Failure - NICM, felt to be 2/2 uncontrolled HTN - Ltd Echo (12/21): EF 30-35%, moderate LVH, G1DD - Echo (6/23): EF 30-35%, G2DD, RV normal, mild to moderate MR - LHC (6/23): persistent thrombus in the left circumflex not amenable to PCI  - Echo (7/24): EF 20-25%, mild LVH, G1DD - Arrange cMRI to evaluate for infiltrative disease. LVH on ECG. - NYHA I, volume OK. ReDs 41% but suspect body habitus contributing to elevated reading. - Start spiro 25 mg daily, decrease KCL to 20 daily. - Continue Lasix 60 mg daily. - Continue Entresto 24/26 mg bid - Continue Coreg 6.25 mg bid. - Continue Farxiga 10 mg daily. - Continue Imdur 15 mg daily. Can stop next if BP drops - Labs today, BMET in 1 week. - Plan to repeat echo when GDMT titrated and he can demonstrate compliance. - If EF remains < 35%, refer to EP for ICD. With Class 1 symptoms, will need CPX first.    2. PVC - Compliant with CPAP - Place 2 week Zio to quantify - Check labs, Mag and TSH today  3. CAD - LHC (6/23): mid Cx 100% stenosed, heavy thrombus, not PCI amenable - No chest pain - Continue DAPT indefinitely - Continue statin  4. HTN - BP controlled - GDMT as above  5. HLD - LDL 137, goal <  55 - Continue atorvastatin 80 mg daily  6. OSA - Continue CPAP  NYHA I GDMT  Diuretic: Lasix 60 mg daily BB: Coreg 6.25  bid Ace/ARB/ARNI: Entresto 24/26 mg bid MRA: Start spiro 25 mg daily SGLT2i: Farxiga 10 mg daily.  Referred to HFSW (PCP, Medications, Transportation, ETOH Abuse, Drug Abuse, Insurance, Financial ): No Refer to Pharmacy: No Refer to Home Health: No Refer to Advanced Heart Failure Clinic: Yes, assign to Dr. Shirlee Latch Refer to General Cardiology: No, shared with Dr. Wyline Mood  Refer to AHF clinic. Follow up in 3 weeks with APP and 12 weeks with Dr. Shirlee Latch + echo.  Prince Rome, FNP-BC 10/26/22  Addendum 16:54: patient had pre-syncopal episode while getting labs drawn. BP initially 70's, CBG 106. BP up to 90's sitting down with legs raised. Given snack and drink. Likely vagally-mediated. - Will stop Imdur. I asked him to check BP daily, and notify clinic if he is symptomatic.

## 2022-10-24 NOTE — Progress Notes (Deleted)
HFU  HFrEF (LV EF 20-25%, global hypokinesis, normal RV, G1DD) Presented to ED with acute shortness of breath, orthopnea, abdominal swelling, chest tightness in the setting of recently running out of his lasix and eating more salty foods. Admitted for acute heart failure exacerbation.  Discharge medications include lasix 60 mg daily, carvedilol 6.25 mg BID, entresto 24/26 BID, farxiga 10 mg daily, imdur 15 mg daily; no spironolactone due to hypotension.   He does*** weigh himself at home and ***. Discharge weight was 312 lbs, today ***. He denies any symptoms of volume overload***.  Had OP cardiology visit today 8:45 AM. Plan:   K 40 mEq daily.   CAD s/p NSTEMI 08/2021 on DAPT Discharge regimen includes ASA 81 mg daily, plavix 75 mg daily, atorvastatin 80 mg daily.  HTN BP today ***.  Plan:Continue carvedilol 6.25 mg BID, furosemide 60 mg daily, imdur 15 mg daily, entresto 24-26 mg daily. Plan:   PMH NSTEMI Combined CHF HTN Prediabetes HLD OSA Obesity Congenital hiatal hernia  PSH   MEDS ASA 81 mg daily Atorvastatin 80 mg daily Carvedilol 6.25 mg BID Clopidogrel 75 mg daily Dapagliflozin propanediol 10 mg daily Furosemide 20 mg daily Isosorbide mononitrate 30 mg daily Nitroglycerin 0.4 SL every 5 minutes PRN chest pain Potassium chloride 40 mEq daily Sacubitril-valsartan 24-26 mg daily  ALLERGIES Penicillins  SOCHX Lives in [place] with his mom. Denies alcohol use, tobacco use, other substance use. Works for [employer]. Independent of ADLs and IADLs.   FAMHX F: CV disease, has had open heart surgery, first diagnosed in his 39s M: HTN

## 2022-10-26 ENCOUNTER — Ambulatory Visit (HOSPITAL_COMMUNITY)
Admit: 2022-10-26 | Discharge: 2022-10-26 | Disposition: A | Discharge status: Home / Self Care | Payer: BC Managed Care – PPO | Attending: Family Medicine | Admitting: Family Medicine

## 2022-10-26 ENCOUNTER — Encounter (HOSPITAL_COMMUNITY): Payer: Self-pay

## 2022-10-26 ENCOUNTER — Inpatient Hospital Stay (HOSPITAL_COMMUNITY)
Admission: RE | Admit: 2022-10-26 | Discharge: 2022-10-26 | Disposition: A | Discharge status: Home / Self Care | Payer: BC Managed Care – PPO | Source: Ambulatory Visit | Attending: Cardiology | Admitting: Cardiology

## 2022-10-26 ENCOUNTER — Other Ambulatory Visit (HOSPITAL_COMMUNITY): Payer: Self-pay | Admitting: Cardiology

## 2022-10-26 VITALS — BP 88/50 | HR 87 | Wt 312.8 lb

## 2022-10-26 DIAGNOSIS — Z91148 Patient's other noncompliance with medication regimen for other reason: Secondary | ICD-10-CM | POA: Insufficient documentation

## 2022-10-26 DIAGNOSIS — I110 Hypertensive heart disease with heart failure: Secondary | ICD-10-CM | POA: Insufficient documentation

## 2022-10-26 DIAGNOSIS — Z79899 Other long term (current) drug therapy: Secondary | ICD-10-CM | POA: Diagnosis not present

## 2022-10-26 DIAGNOSIS — I493 Ventricular premature depolarization: Secondary | ICD-10-CM | POA: Diagnosis not present

## 2022-10-26 DIAGNOSIS — E669 Obesity, unspecified: Secondary | ICD-10-CM | POA: Insufficient documentation

## 2022-10-26 DIAGNOSIS — G4733 Obstructive sleep apnea (adult) (pediatric): Secondary | ICD-10-CM | POA: Diagnosis not present

## 2022-10-26 DIAGNOSIS — I2510 Atherosclerotic heart disease of native coronary artery without angina pectoris: Secondary | ICD-10-CM | POA: Diagnosis not present

## 2022-10-26 DIAGNOSIS — E785 Hyperlipidemia, unspecified: Secondary | ICD-10-CM | POA: Diagnosis not present

## 2022-10-26 DIAGNOSIS — I428 Other cardiomyopathies: Secondary | ICD-10-CM | POA: Insufficient documentation

## 2022-10-26 DIAGNOSIS — I5022 Chronic systolic (congestive) heart failure: Secondary | ICD-10-CM

## 2022-10-26 DIAGNOSIS — I5042 Chronic combined systolic (congestive) and diastolic (congestive) heart failure: Secondary | ICD-10-CM | POA: Diagnosis not present

## 2022-10-26 DIAGNOSIS — I252 Old myocardial infarction: Secondary | ICD-10-CM | POA: Insufficient documentation

## 2022-10-26 DIAGNOSIS — E782 Mixed hyperlipidemia: Secondary | ICD-10-CM

## 2022-10-26 DIAGNOSIS — I10 Essential (primary) hypertension: Secondary | ICD-10-CM | POA: Diagnosis not present

## 2022-10-26 LAB — BASIC METABOLIC PANEL
Anion gap: 14 (ref 5–15)
BUN: 14 mg/dL (ref 6–20)
CO2: 23 mmol/L (ref 22–32)
Calcium: 9.4 mg/dL (ref 8.9–10.3)
Chloride: 102 mmol/L (ref 98–111)
Creatinine, Ser: 1.65 mg/dL — ABNORMAL HIGH (ref 0.61–1.24)
GFR, Estimated: 52 mL/min — ABNORMAL LOW (ref >60)
Glucose, Bld: 121 mg/dL — ABNORMAL HIGH (ref 70–99)
Potassium: 3.4 mmol/L — ABNORMAL LOW (ref 3.5–5.1)
Sodium: 139 mmol/L (ref 135–145)

## 2022-10-26 LAB — TSH: TSH: 4.007 u[IU]/mL (ref 0.350–4.500)

## 2022-10-26 LAB — BRAIN NATRIURETIC PEPTIDE: B Natriuretic Peptide: 41.3 pg/mL (ref 0.0–100.0)

## 2022-10-26 LAB — MAGNESIUM: Magnesium: 2.1 mg/dL (ref 1.7–2.4)

## 2022-10-26 MED ORDER — SPIRONOLACTONE 25 MG PO TABS: 25.0000 mg | ORAL_TABLET | Freq: Every day | ORAL | 5 refills | Status: DC

## 2022-10-26 MED ORDER — POTASSIUM CHLORIDE CRYS ER 20 MEQ PO TBCR: 20.0000 meq | EXTENDED_RELEASE_TABLET | Freq: Every day | ORAL | 5 refills | Status: AC

## 2022-10-26 NOTE — Progress Notes (Signed)
While patient was getting lab work drawn,patient c/o feeling dizzy. Patient hadn"t eaten. Patient placed in cardiac chair. Patient awake talking.fingerstick 106. Difficult to obtain manual b/p Obtained by automatic machine. Patient given something to eat.Prince Rome NP in to see patient

## 2022-10-26 NOTE — Addendum Note (Signed)
Encounter addended by: Chinita Pester, CMA on: 10/26/2022 4:52 PM  Actions taken: Diagnosis association updated, Order list changed, Medication long-term status modified, Clinical Note Signed

## 2022-10-26 NOTE — Progress Notes (Signed)
ReDS Vest / Clip - 10/26/22 1500       ReDS Vest / Clip   Station Marker D    Ruler Value 47    ReDS Value Range High volume overload    ReDS Actual Value 41

## 2022-10-26 NOTE — Addendum Note (Signed)
Encounter addended by: Suezanne Cheshire, RN on: 10/26/2022 5:08 PM  Actions taken: Clinical Note Signed, Vitals modified

## 2022-10-26 NOTE — Addendum Note (Signed)
Encounter addended by: Jacklynn Ganong, FNP on: 10/26/2022 4:55 PM  Actions taken: Clinical Note Signed

## 2022-10-26 NOTE — Patient Instructions (Addendum)
Labs done today. We will contact you only if your labs are abnormal.  START Spironolactone 25mg  (1 tablet) by mouth daily.  DECREASE Potassium to 30meq(1 tablet) by mouth daily.   STOP taking Imdur  No other medication changes were made. Please continue all current medications as prescribed.  Your physician has requested that you regularly monitor and record your blood pressure readings at home. Please use the same machine at the same time of day to check your readings and record them to bring to your follow-up visit.  Your provider has recommended that  you wear a Zio Patch for 14 days.  This monitor will record your heart rhythm for our review.  IF you have any symptoms while wearing the monitor please press the button.  If you have any issues with the patch or you notice a red or orange light on it please call the company at [phone].  Once you remove the patch please mail it back to the company as soon as possible so we can get the results.  Your physician recommends that you schedule a follow-up appointment in: 1 week for a lab only appointment, 3 weeks with our NP/PA Clinic and in 3 months with Dr. Shirlee Latch with an echo prior to your appointment.  Your physician has requested that you have an echocardiogram. Echocardiography is a painless test that uses sound waves to create images of your heart. It provides your doctor with information about the size and shape of your heart and how well your heart's chambers and valves are working. This procedure takes approximately one hour. There are no restrictions for this procedure. Please do NOT wear cologne, perfume, aftershave, or lotions (deodorant is allowed). Please arrive 15 minutes prior to your appointment time.  If you have any questions or concerns before your next appointment please send [place] a message through [place] or call our office at [phone].    TO LEAVE A MESSAGE FOR THE NURSE SELECT OPTION 2, PLEASE LEAVE A MESSAGE  INCLUDING: YOUR NAME DATE OF BIRTH CALL BACK NUMBER REASON FOR CALL**this is important as we prioritize the call backs  YOU WILL RECEIVE A CALL BACK THE SAME DAY AS LONG AS YOU CALL BEFORE 4:00 PM   Do the following things EVERYDAY: Weigh yourself in the morning before breakfast. Write it down and keep it in a log. Take your medicines as prescribed Eat low salt foods--Limit salt (sodium) to 2000 mg per day.  Stay as active as you can everyday Limit all fluids for the day to less than 2 liters   At the [facility], you and your health needs are our priority. As part of our continuing mission to provide you with exceptional heart care, we have created designated Provider Care Teams. These Care Teams include your primary Cardiologist (physician) and Advanced Practice Providers (APPs- Physician Assistants and Nurse Practitioners) who all work together to provide you with the care you need, when you need it.   You may see any of the following providers on your designated Care Team at your next follow up: Dr Arvilla Meres Dr Marca Ancona Dr. Marcos Eke, NP Robbie Lis, [place] [facility] [place], [place] Brynda Peon, NP Karle Plumber, PharmD   Please be sure to bring in all your medications bottles to every appointment.    Thank you for choosing [facility]

## 2022-10-28 ENCOUNTER — Other Ambulatory Visit (HOSPITAL_COMMUNITY): Payer: Self-pay

## 2022-10-28 ENCOUNTER — Telehealth (HOSPITAL_COMMUNITY): Payer: Self-pay

## 2022-10-28 ENCOUNTER — Other Ambulatory Visit: Payer: Self-pay

## 2022-10-28 MED ORDER — FUROSEMIDE 20 MG PO TABS
20.0000 mg | ORAL_TABLET | Freq: Every day | ORAL | 3 refills | Status: DC
  Filled 2022-10-28: qty 90, 90d supply, fill #0

## 2022-10-28 NOTE — Telephone Encounter (Signed)
-----   Message from Jacklynn Ganong sent at 10/28/2022  8:17 AM EDT ----- K is low, but starting spiro will help bring this up.  Kidney function mildly elevated, please decrease Lasix to 20 mg daily.  He has repeat labs arranged

## 2022-10-28 NOTE — Telephone Encounter (Signed)
Patient's lasix medication has been changed to 20 mg daily and med list has been updated to reflect this change. Pt aware, agreeable, and verbalized understanding.

## 2022-11-02 ENCOUNTER — Ambulatory Visit (HOSPITAL_COMMUNITY)
Admission: RE | Admit: 2022-11-02 | Discharge: 2022-11-02 | Disposition: A | Discharge status: Home / Self Care | Payer: BC Managed Care – PPO | Source: Ambulatory Visit | Attending: Family Medicine | Admitting: Family Medicine

## 2022-11-02 DIAGNOSIS — I5022 Chronic systolic (congestive) heart failure: Secondary | ICD-10-CM | POA: Diagnosis not present

## 2022-11-02 LAB — BASIC METABOLIC PANEL
Anion gap: 8 (ref 5–15)
BUN: 12 mg/dL (ref 6–20)
CO2: 24 mmol/L (ref 22–32)
Calcium: 9.2 mg/dL (ref 8.9–10.3)
Chloride: 105 mmol/L (ref 98–111)
Creatinine, Ser: 1.09 mg/dL (ref 0.61–1.24)
GFR, Estimated: >60 mL/min (ref >60)
Glucose, Bld: 120 mg/dL — ABNORMAL HIGH (ref 70–99)
Potassium: 4 mmol/L (ref 3.5–5.1)
Sodium: 137 mmol/L (ref 135–145)

## 2022-11-14 DIAGNOSIS — G4733 Obstructive sleep apnea (adult) (pediatric): Secondary | ICD-10-CM | POA: Diagnosis not present

## 2022-11-15 NOTE — Progress Notes (Incomplete)
ADVANCED HF CLINIC CONSULT NOTE  Referring Physician: Annett Fabian, MD Primary Care: Annett Fabian, MD Primary Cardiologist: Maisie Fus, MD  HPI: Joseph Ray is a 47 y.o. male with a hx of chronic combined CHF, HTN, CAD, obesity, and OSA.   Admitted 6/23 with nSTEMI. LHC showed persistent thrombus in left circumflex, not amenable to PCI. Treated medically with indefinite DAPT. Echo showed EF 30-35%.    Followed closely with Dr. Wyline Mood, with plans for repeat echo and referral to EP for ICD if EF not > 35%.    Admitted 7/24 with a/c HF, 2/2 medication and lifestyle noncompliance. Diuresed with IV lasix. Echo showed EF 20-25%, mild LVH, G1DD, normal RV, moderate MR. Did not pursue ischemic eval as he did not have CP or acute ECG changes. GDMT titrated and he was discharged home, weight 312 lbs.   Today he presents to [facility] for post-hospital HF follow up. Overall feeling fine. He walks 1 mile/day. He is not SOB with activity. He has occasional dizziness, and feels palpitations. Denies CP, edema, or PND/Orthopnea. Appetite ok. No fever or chills. Weight at home  310 pounds. Taking all medications. Limiting fluids to < 1/2 gal. He wears CPAP. BP home ~ 120s/70s. Stopped smoking tobacco/THC, ETOH > 1 year ago.    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, single, he has 3 boys   ECG (personally reviewed):ST with PVCs   ReDs: 41%   Labs (7/24): K 3.4, creatinine 1.12   Cardiac Testing    - Echo (7/24): EF 20-25%, mild LVH, G1DD   - LHC (6/23): persistent thrombus in the left circumflex not amenable to PCI,    - Echo (6/23): EF 30-35%, G2DD, RV normal, mild to moderate MR   - Ltd echo (12/21): EF 30-35%, moderate LVH, G1DD   Review of Systems: [y] = yes, [ ]  = no    General: Weight gain [ ] ; Weight loss [ ] ; Anorexia [ ] ; Fatigue [ ] ; Fever [ ] ; Chills [ ] ; Weakness [ ]   Cardiac: Chest pain/pressure [ ] ; Resting  SOB [ ] ; Exertional SOB [ ] ; Orthopnea [ ] ; Pedal Edema [ ] ; Palpitations [ ] ; Syncope [ ] ; Presyncope [ ] ; Paroxysmal nocturnal dyspnea[ ]   Pulmonary: Cough [ ] ; Wheezing[ ] ; Hemoptysis[ ] ; Sputum [ ] ; Snoring [name] ]  GI: Vomiting[ ] ; Dysphagia[ ] ; Melena[ ] ; Hematochezia [ ] ; Heartburn[ ] ; Abdominal pain [ ] ; Constipation [ ] ; Diarrhea [ ] ; BRBPR [ ]   GU: Hematuria[ ] ; Dysuria [ ] ; Nocturia[ ]   Vascular: Pain in legs with walking [ ] ; Pain in feet with lying flat [ ] ; Non-healing sores [ ] ; Stroke [ ] ; TIA [ ] ; Slurred speech [ ] ;  Neuro: Headaches[ ] ; Vertigo[ ] ; Seizures[ ] ; Paresthesias[ ] ;Blurred vision [ ] ; Diplopia [ ] ; Vision changes [ ]   Ortho/Skin: Arthritis [ ] ; Joint pain [ ] ; Muscle pain [ ] ; Joint swelling [ ] ; Back Pain [ ] ; Rash [ ]   Psych: Depression[ ] ; Anxiety[ ]   Heme: Bleeding problems [ ] ; Clotting disorders [ ] ; Anemia [ ]   Endocrine: Diabetes [y]; Thyroid dysfunction[ ]      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 Swallow whole. 30 tablet 12   atorvastatin (LIPITOR) 80 MG tablet Take 1 tablet (80 mg total) by mouth daily. 90 tablet 2   carvedilol (COREG) 6.25 MG tablet Take 1 tablet (6.25 mg total) by mouth 2 (two) times daily with a meal. 180 tablet 2   furosemide (LASIX) 20 MG tablet Take 1 tablet (20 mg total) by mouth daily. Please check you weight daily. If you (1) notice an increase of 2 pounds daily or 5 pounds in a week in your weight, (2) increase swelling in your legs (3) feeling short of  breath when sleeping or laying flat. You can take an additional dose. If you see these changes, please call us at the IM clinic and schedule an appointment.    Max daily dose 20 mg. 90 tablet 3   nitroGLYCERIN (NITROSTAT) 0.4 MG SL tablet Place 1 tablet (0.4 mg total) under the tongue every 5 (five) minutes as needed. (Patient taking differently: Place 0.4 mg under the tongue every 5 (five) minutes as needed for chest pain.) 25 tablet 3   potassium chloride SA (KLOR-CON M) 20 MEQ tablet Take 1 tablet (20 mEq total) by mouth daily. 30 tablet 5   sacubitril-valsartan (ENTRESTO) 24-26 MG Take 1 tablet by mouth 2 (two) times daily. 60 tablet 2   spironolactone (ALDACTONE) 25 MG tablet Take 1 tablet (25 mg total) by mouth daily. 30 tablet 5   No current facility-administered medications for this visit.    Allergies  Allergen Reactions   Penicillins Rash    Has patient had a PCN reaction causing immediate rash, facial/tongue/throat swelling, SOB or lightheadedness with hypotension: NoNo Has patient had a PCN reaction causing severe rash involving mucus membranes or skin necrosis: NoNo Has patient had a PCN reaction that required hospitalization NoNo Has patient had a PCN reaction occurring within the last 10 years: NoNo If all of the above answers are "NO", then may proceed with Cephalosporin       Social History   Socioeconomic History   Marital status: Single    Spouse name: Not on file   Number of children: 3   Years of education: Not on file   Highest education level: High school graduate  Occupational History   Not on file  Tobacco Use   Smoking status: Former    Current packs/day: 0.15    Average packs/day: 0.2 packs/day for 15.0 years (2.3 ttl pk-yrs)    Types: Cigarettes   Smokeless tobacco: Never   Tobacco comments:    Stopped smoking in May 2023.  Vaping Use   Vaping status: Never Used  Substance and Sexual Activity   Alcohol use: Not Currently   Drug use: No    Sexual activity: Not on file  Other Topics Concern   Not on file  Social History Narrative   Not on file   Social Determinants of Health   Financial Resource Strain: Low Risk  (10/11/2022)   Overall Financial Resource Strain (CARDIA)    Difficulty of Paying Living Expenses: Not very hard  Food Insecurity: No Food Insecurity (10/09/2022)   Hunger Vital Sign    Worried About Running Out of Food in the Last Year: Never true    Ran  Out of Food in the Last Year: Never true  Transportation Needs: No Transportation Needs (10/11/2022)   PRAPARE - Administrator, Civil Service (Medical): No    Lack of Transportation (Non-Medical): No  Physical Activity: Not on file  Stress: Not on file  Social Connections: Unknown (07/16/2021)   Received from [facility]   Social Network    Social Network: Not on file  Intimate Partner Violence: Not At Risk (10/09/2022)   Humiliation, Afraid, Rape, and Kick questionnaire    Fear of Current or Ex-Partner: No    Emotionally Abused: No    Physically Abused: No    Sexually Abused: No      Family History  Problem Relation Age of Onset   Hypertension Mother    Heart disease Father    Hypertension Father    Hypertension Brother    Cancer Paternal Aunt    Hypertension Paternal Grandfather    Heart disease Paternal Grandfather     There were no vitals filed for this visit.  PHYSICAL EXAM: General:  Well appearing. No respiratory difficulty HEENT: normal Neck: supple. no JVD. Carotids 2+ bilat; no bruits. No lymphadenopathy or thryomegaly appreciated. Cor: PMI nondisplaced. Regular rate & rhythm. No rubs, gallops or murmurs. Lungs: clear Abdomen: soft, nontender, nondistended. No hepatosplenomegaly. No bruits or masses. Good bowel sounds. Extremities: no cyanosis, clubbing, rash, edema Neuro: alert & oriented x 3, cranial nerves grossly intact. moves all 4 extremities w/o difficulty. Affect pleasant.  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 LVH on ECG. - NYHA I, volume OK. ReDs 41% but suspect body habitus contributing to elevated reading. - Start spiro 25 mg daily, decrease KCL to 20 daily. - Continue Lasix 60 mg daily. - Continue Entresto 24/26 mg bid - Continue Coreg 6.25 mg bid. - Continue Farxiga 10 mg daily. - Continue Imdur 15 mg daily. Can stop next if BP drops - Labs today, BMET in 1 week. - Plan to repeat echo when GDMT titrated and he can demonstrate compliance. - If EF remains < 35%, refer to EP for ICD. With Class 1 symptoms, will need CPX first.     2. PVC - Compliant with CPAP - Place 2 week Zio to quantify - Check labs, Mag and TSH today   3. CAD - LHC (6/23): mid Cx 100% stenosed, heavy thrombus, not PCI amenable - No chest pain - Continue DAPT indefinitely - Continue statin   4. HTN - BP controlled - GDMT as above   5. HLD - LDL 137, goal < 55 - Continue atorvastatin 80 mg daily   6. OSA - Continue CPAP   NYHA I GDMT  Diuretic: Lasix 60 mg daily BB: Coreg 6.25 bid Ace/ARB/ARNI: Entresto 24/26 mg bid MRA: Start spiro 25 mg daily SGLT2i: Farxiga 10 mg daily.   Referred to HFSW (PCP, Medications, Transportation, ETOH Abuse, Drug Abuse, Insurance, Financial ): No Refer to Pharmacy: No Refer to Home Health: No Refer to Advanced [facility] Clinic: Yes, assign to Dr. Shirlee Latch Refer to General Cardiology: No, shared with Dr. Wyline Mood   Refer to AHF clinic. Follow up in  3 weeks with APP and 12 weeks with Dr. Shirlee Latch + echo.   Prince Rome, FNP-BC 10/26/22   Addendum 16:54: patient had pre-syncopal episode while getting labs drawn. BP initially  70's, CBG 106. BP up to 90's sitting down with legs raised. Given snack and drink. Likely vagally-mediated. - Will stop Imdur. I asked him to check BP daily, and notify clinic if he is symptomatic.

## 2022-11-17 ENCOUNTER — Encounter (HOSPITAL_COMMUNITY): Payer: Self-pay

## 2022-11-17 ENCOUNTER — Ambulatory Visit (HOSPITAL_COMMUNITY)
Admission: RE | Admit: 2022-11-17 | Discharge: 2022-11-17 | Disposition: A | Discharge status: Home / Self Care | Payer: BC Managed Care – PPO | Source: Ambulatory Visit | Attending: Family Medicine | Admitting: Family Medicine

## 2022-11-17 VITALS — BP 142/98 | HR 88 | Wt 319.8 lb

## 2022-11-17 DIAGNOSIS — I493 Ventricular premature depolarization: Secondary | ICD-10-CM | POA: Insufficient documentation

## 2022-11-17 DIAGNOSIS — G4733 Obstructive sleep apnea (adult) (pediatric): Secondary | ICD-10-CM | POA: Insufficient documentation

## 2022-11-17 DIAGNOSIS — I252 Old myocardial infarction: Secondary | ICD-10-CM | POA: Diagnosis not present

## 2022-11-17 DIAGNOSIS — Z87891 Personal history of nicotine dependence: Secondary | ICD-10-CM | POA: Insufficient documentation

## 2022-11-17 DIAGNOSIS — E785 Hyperlipidemia, unspecified: Secondary | ICD-10-CM | POA: Insufficient documentation

## 2022-11-17 DIAGNOSIS — Z8249 Family history of ischemic heart disease and other diseases of the circulatory system: Secondary | ICD-10-CM | POA: Diagnosis not present

## 2022-11-17 DIAGNOSIS — Z6839 Body mass index (BMI) 39.0-39.9, adult: Secondary | ICD-10-CM | POA: Diagnosis not present

## 2022-11-17 DIAGNOSIS — E1169 Type 2 diabetes mellitus with other specified complication: Secondary | ICD-10-CM

## 2022-11-17 DIAGNOSIS — Z79899 Other long term (current) drug therapy: Secondary | ICD-10-CM | POA: Insufficient documentation

## 2022-11-17 DIAGNOSIS — I10 Essential (primary) hypertension: Secondary | ICD-10-CM

## 2022-11-17 DIAGNOSIS — E669 Obesity, unspecified: Secondary | ICD-10-CM | POA: Insufficient documentation

## 2022-11-17 DIAGNOSIS — I110 Hypertensive heart disease with heart failure: Secondary | ICD-10-CM | POA: Diagnosis not present

## 2022-11-17 DIAGNOSIS — I5022 Chronic systolic (congestive) heart failure: Secondary | ICD-10-CM | POA: Diagnosis not present

## 2022-11-17 DIAGNOSIS — Z7984 Long term (current) use of oral hypoglycemic drugs: Secondary | ICD-10-CM | POA: Diagnosis not present

## 2022-11-17 DIAGNOSIS — I2510 Atherosclerotic heart disease of native coronary artery without angina pectoris: Secondary | ICD-10-CM | POA: Diagnosis not present

## 2022-11-17 DIAGNOSIS — I428 Other cardiomyopathies: Secondary | ICD-10-CM | POA: Diagnosis not present

## 2022-11-17 DIAGNOSIS — E782 Mixed hyperlipidemia: Secondary | ICD-10-CM

## 2022-11-17 MED ORDER — CARVEDILOL 6.25 MG PO TABS: 9.3750 mg | ORAL_TABLET | Freq: Two times a day (BID) | ORAL | 2 refills | Status: DC

## 2022-11-17 NOTE — Progress Notes (Signed)
ReDS Vest / Clip - 11/17/22 1400       ReDS Vest / Clip   Station Marker D    Ruler Value 43    ReDS Value Range High volume overload    ReDS Actual Value 46

## 2022-11-17 NOTE — Patient Instructions (Addendum)
RedsClip done today.  No Labs done today.   INCREASE Carvedilol to 9.375mg  (1 & 1/2 tablets) by mouth 2 times daily.   No other medication changes were made. Please continue all current medications as prescribed.  You have been referred to Cardiac Rehab. They will contact you to schedule an appointment.   Your physician recommends that you schedule a follow-up appointment in: 10-14 days for a lab only appointment. Keep your pending follow up appointment with Dr. Shirlee Latch.   If you have any questions or concerns before your next appointment please send [place] a message through [place] or call our office at [phone].    TO LEAVE A MESSAGE FOR THE NURSE SELECT OPTION 2, PLEASE LEAVE A MESSAGE INCLUDING: YOUR NAME DATE OF BIRTH CALL BACK NUMBER REASON FOR CALL**this is important as we prioritize the call backs  YOU WILL RECEIVE A CALL BACK THE SAME DAY AS LONG AS YOU CALL BEFORE 4:00 PM   Do the following things EVERYDAY: Weigh yourself in the morning before breakfast. Write it down and keep it in a log. Take your medicines as prescribed Eat low salt foods--Limit salt (sodium) to 2000 mg per day.  Stay as active as you can everyday Limit all fluids for the day to less than 2 liters   At the [facility], you and your health needs are our priority. As part of our continuing mission to provide you with exceptional heart care, we have created designated Provider Care Teams. These Care Teams include your primary Cardiologist (physician) and Advanced Practice Providers (APPs- Physician Assistants and Nurse Practitioners) who all work together to provide you with the care you need, when you need it.   You may see any of the following providers on your designated Care Team at your next follow up: Dr Arvilla Meres Dr Marca Ancona Dr. Marcos Eke, NP Robbie Lis, [place] [facility] [place], [place] Brynda Peon, NP Karle Plumber, PharmD   Please  be sure to bring in all your medications bottles to every appointment.    Thank you for choosing [facility]

## 2022-11-22 DIAGNOSIS — I493 Ventricular premature depolarization: Secondary | ICD-10-CM | POA: Diagnosis not present

## 2022-11-24 NOTE — Addendum Note (Signed)
Encounter addended by: Crissie Figures, RN on: 11/24/2022 10:42 AM  Actions taken: Imaging Exam ended

## 2022-11-29 ENCOUNTER — Telehealth (HOSPITAL_COMMUNITY): Payer: Self-pay

## 2022-11-29 NOTE — Telephone Encounter (Addendum)
Patient aware of long term monitor results  ----- Message from Marca Ancona sent at 11/27/2022  6:39 PM EDT ----- Occasional PVCs.

## 2022-12-01 ENCOUNTER — Other Ambulatory Visit (HOSPITAL_COMMUNITY): Payer: BC Managed Care – PPO

## 2022-12-08 ENCOUNTER — Telehealth (HOSPITAL_COMMUNITY): Payer: Self-pay

## 2022-12-08 NOTE — Telephone Encounter (Signed)
Disability forms faxed to the [employer] on 12/08/22 at [phone]. My chart message sent to patient to inform him

## 2022-12-12 ENCOUNTER — Ambulatory Visit (HOSPITAL_COMMUNITY)
Admission: RE | Admit: 2022-12-12 | Discharge: 2022-12-12 | Disposition: A | Discharge status: Home / Self Care | Payer: BC Managed Care – PPO | Source: Ambulatory Visit | Attending: Internal Medicine | Admitting: Internal Medicine

## 2022-12-12 DIAGNOSIS — I5022 Chronic systolic (congestive) heart failure: Secondary | ICD-10-CM | POA: Diagnosis not present

## 2022-12-12 LAB — BASIC METABOLIC PANEL
Anion gap: 10 (ref 5–15)
BUN: 14 mg/dL (ref 6–20)
CO2: 24 mmol/L (ref 22–32)
Calcium: 9.2 mg/dL (ref 8.9–10.3)
Chloride: 104 mmol/L (ref 98–111)
Creatinine, Ser: 1.08 mg/dL (ref 0.61–1.24)
GFR, Estimated: >60 mL/min (ref >60)
Glucose, Bld: 175 mg/dL — ABNORMAL HIGH (ref 70–99)
Potassium: 3.9 mmol/L (ref 3.5–5.1)
Sodium: 138 mmol/L (ref 135–145)

## 2022-12-12 NOTE — Addendum Note (Signed)
Encounter addended by: Noralee Space, RN on: 12/12/2022 12:20 PM  Actions taken: Order list changed, Diagnosis association updated

## 2022-12-14 DIAGNOSIS — G4733 Obstructive sleep apnea (adult) (pediatric): Secondary | ICD-10-CM | POA: Diagnosis not present

## 2022-12-19 ENCOUNTER — Encounter (HOSPITAL_COMMUNITY): Payer: Self-pay

## 2022-12-19 ENCOUNTER — Telehealth: Payer: Self-pay | Admitting: *Deleted

## 2022-12-21 ENCOUNTER — Ambulatory Visit (HOSPITAL_COMMUNITY): Admission: RE | Admit: 2022-12-21 | Payer: BC Managed Care – PPO | Source: Ambulatory Visit

## 2022-12-21 ENCOUNTER — Telehealth (HOSPITAL_COMMUNITY): Payer: Self-pay

## 2022-12-21 NOTE — Telephone Encounter (Signed)
Refaxed medicaid form.  Placed back in medicaid folder.

## 2022-12-30 ENCOUNTER — Telehealth (HOSPITAL_COMMUNITY): Payer: Self-pay

## 2022-12-30 NOTE — Telephone Encounter (Signed)
Called patient to see if he was interested in participating in the Cardiac Rehab Program. Patient stated yes. Patient will come in for orientation on 01/03/23 @ 10:30AM and will attend the 1:45PM exercise class. Went over insurance, patient verbalized understanding.

## 2022-12-30 NOTE — Telephone Encounter (Signed)
Pt insurance is active and benefits verified through [employer]. Co-pay $0.00, DED $3,000.00/$3,000.00 met, out of pocket $7,350.00/$7,350.00 met, co-insurance 30%. No pre-authorization required. Passport, 12/30/22 @ 3:30PM, REF#[id number]   How many CR sessions are covered? (36 visits for TCR, 72 visits for ICR)72 Is this a lifetime maximum or an annual maximum?Annual Has the member used any of these services to date? No Is there a time limit (weeks/months) on start of program and/or program completion? No   2ndary insurance is active and benefits verified through Medicaid. Co-pay $0.00, DED $0.00/$0.00 met, out of pocket $0.00/$0.00 met, co-insurance 0%. No pre-authorization required. [employer]port, 12/30/22 @ 3:34PM, REF#[id number]

## 2023-01-02 ENCOUNTER — Telehealth (HOSPITAL_COMMUNITY): Payer: Self-pay

## 2023-01-02 NOTE — Telephone Encounter (Signed)
Called to confirm patient's Cardiac Rehab appointment tomorrow. Unable to complete nursing assessment at this time. Will call back around 3pm, per patient request.

## 2023-01-02 NOTE — Telephone Encounter (Signed)
Returned call to patient, LVM for call back

## 2023-01-03 ENCOUNTER — Ambulatory Visit (HOSPITAL_COMMUNITY): Payer: BC Managed Care – PPO

## 2023-01-03 ENCOUNTER — Telehealth (HOSPITAL_COMMUNITY): Payer: Self-pay | Admitting: *Deleted

## 2023-01-03 NOTE — Telephone Encounter (Signed)
Left message to call cardiac rehab.Shaynah Hund Walden Kinzi Frediani RN BSN  

## 2023-01-09 ENCOUNTER — Ambulatory Visit (HOSPITAL_COMMUNITY): Payer: BC Managed Care – PPO

## 2023-01-11 ENCOUNTER — Ambulatory Visit (HOSPITAL_COMMUNITY): Payer: BC Managed Care – PPO

## 2023-01-13 ENCOUNTER — Ambulatory Visit (HOSPITAL_COMMUNITY): Payer: BC Managed Care – PPO

## 2023-01-16 ENCOUNTER — Ambulatory Visit (HOSPITAL_COMMUNITY): Payer: BC Managed Care – PPO

## 2023-01-18 ENCOUNTER — Ambulatory Visit (HOSPITAL_COMMUNITY): Payer: BC Managed Care – PPO

## 2023-01-19 ENCOUNTER — Encounter (HOSPITAL_COMMUNITY): Payer: Self-pay

## 2023-01-20 ENCOUNTER — Ambulatory Visit (HOSPITAL_COMMUNITY): Payer: BC Managed Care – PPO

## 2023-01-23 ENCOUNTER — Telehealth (HOSPITAL_COMMUNITY): Payer: Self-pay | Admitting: *Deleted

## 2023-01-23 ENCOUNTER — Ambulatory Visit (HOSPITAL_COMMUNITY): Payer: BC Managed Care – PPO

## 2023-01-23 NOTE — Telephone Encounter (Signed)

## 2023-01-24 ENCOUNTER — Ambulatory Visit (HOSPITAL_COMMUNITY)
Admission: RE | Admit: 2023-01-24 | Discharge: 2023-01-24 | Disposition: A | Discharge status: Home / Self Care | Payer: BC Managed Care – PPO | Source: Ambulatory Visit | Attending: Cardiology | Admitting: Cardiology

## 2023-01-24 ENCOUNTER — Other Ambulatory Visit (HOSPITAL_COMMUNITY): Payer: Self-pay | Admitting: Cardiology

## 2023-01-24 DIAGNOSIS — I5022 Chronic systolic (congestive) heart failure: Secondary | ICD-10-CM

## 2023-01-24 MED ORDER — GADOBUTROL 1 MMOL/ML IV SOLN
10.0000 mL | Freq: Once | INTRAVENOUS | Status: AC | PRN
  Administered 2023-01-24: 10 mL via INTRAVENOUS

## 2023-01-25 ENCOUNTER — Ambulatory Visit (HOSPITAL_COMMUNITY): Payer: BC Managed Care – PPO

## 2023-01-25 ENCOUNTER — Ambulatory Visit (HOSPITAL_COMMUNITY): Admission: RE | Admit: 2023-01-25 | Payer: BC Managed Care – PPO | Source: Ambulatory Visit

## 2023-01-25 ENCOUNTER — Encounter (HOSPITAL_COMMUNITY): Payer: BC Managed Care – PPO | Admitting: Cardiology

## 2023-01-27 ENCOUNTER — Ambulatory Visit (HOSPITAL_COMMUNITY): Payer: BC Managed Care – PPO

## 2023-01-30 ENCOUNTER — Ambulatory Visit (HOSPITAL_COMMUNITY): Payer: BC Managed Care – PPO

## 2023-02-01 ENCOUNTER — Ambulatory Visit (HOSPITAL_COMMUNITY): Payer: BC Managed Care – PPO

## 2023-02-03 ENCOUNTER — Encounter (HOSPITAL_COMMUNITY): Payer: Self-pay

## 2023-02-03 ENCOUNTER — Ambulatory Visit (HOSPITAL_COMMUNITY): Payer: BC Managed Care – PPO

## 2023-02-03 IMAGING — DX DG CHEST 1V PORT
3 series · 3 of 3 positions shown · non-contrast
Comparison: June 13, 2020.

CLINICAL DATA: Dyspnea with exertion, chest pain.

EXAM:
PORTABLE CHEST 1 VIEW

[chest ap (1 of 3)]
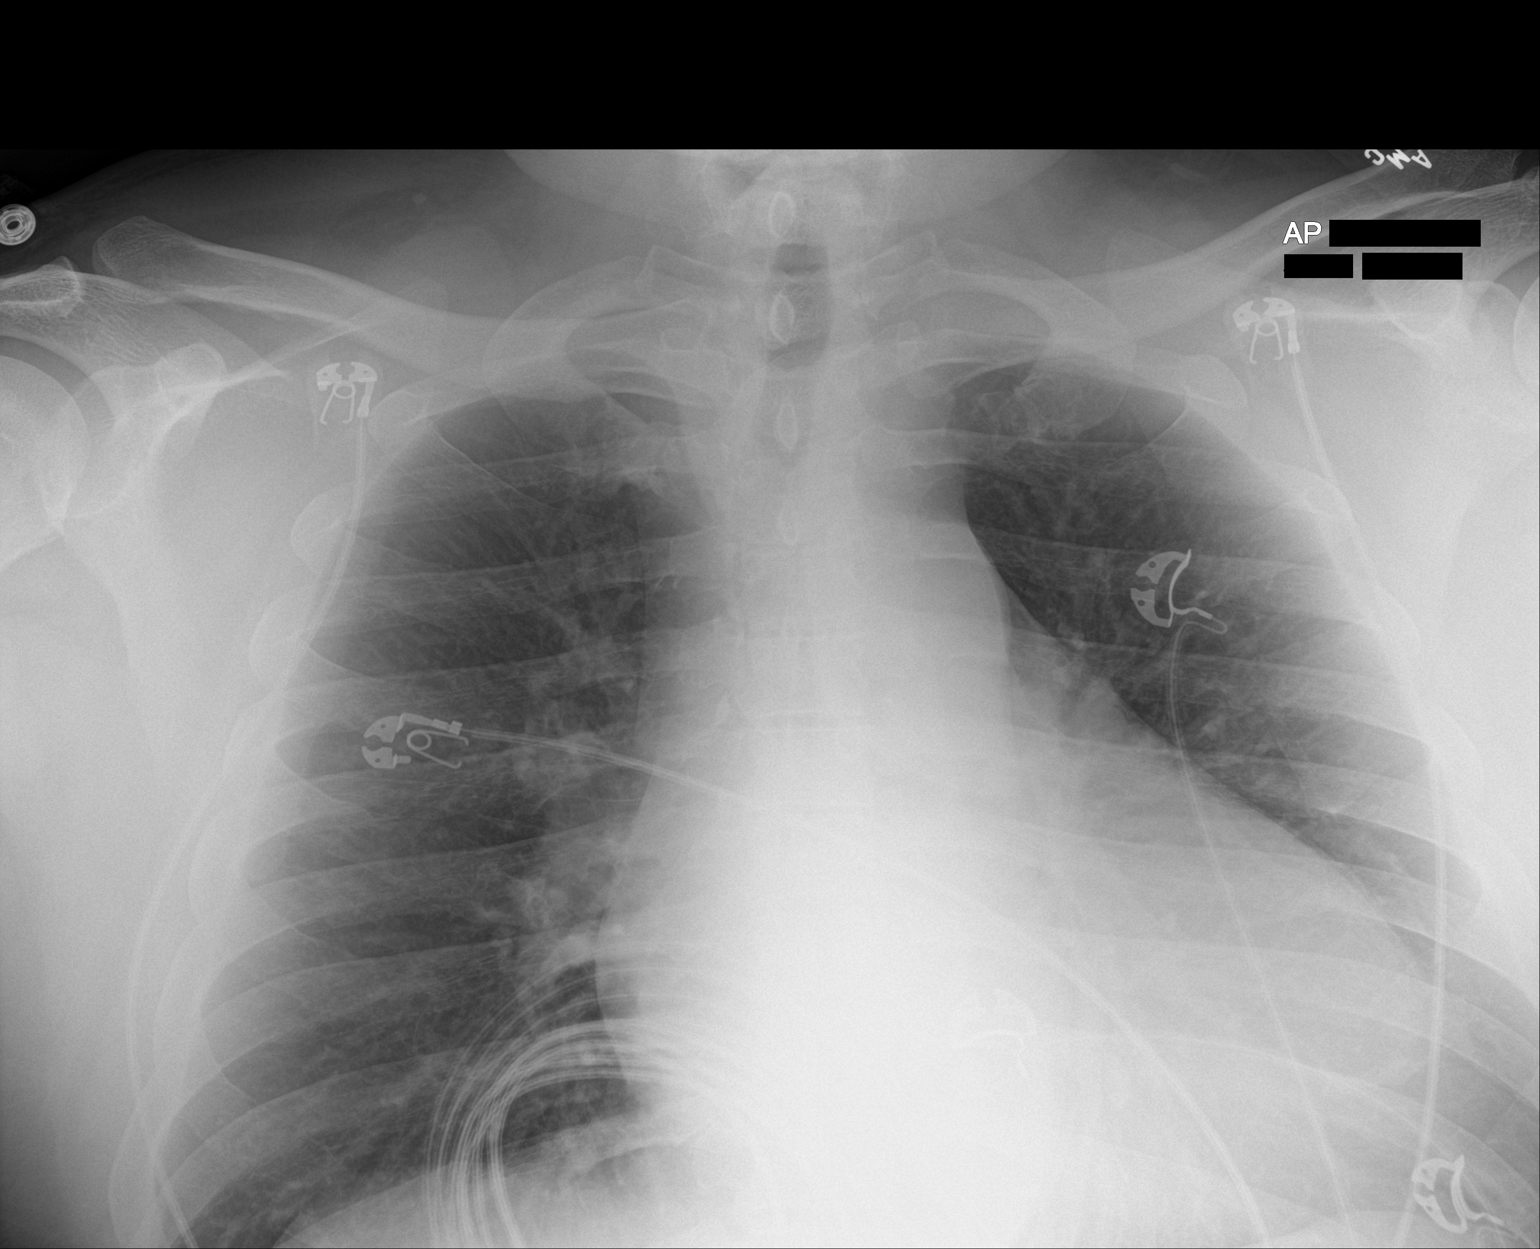

[chest ap (2 of 3)]
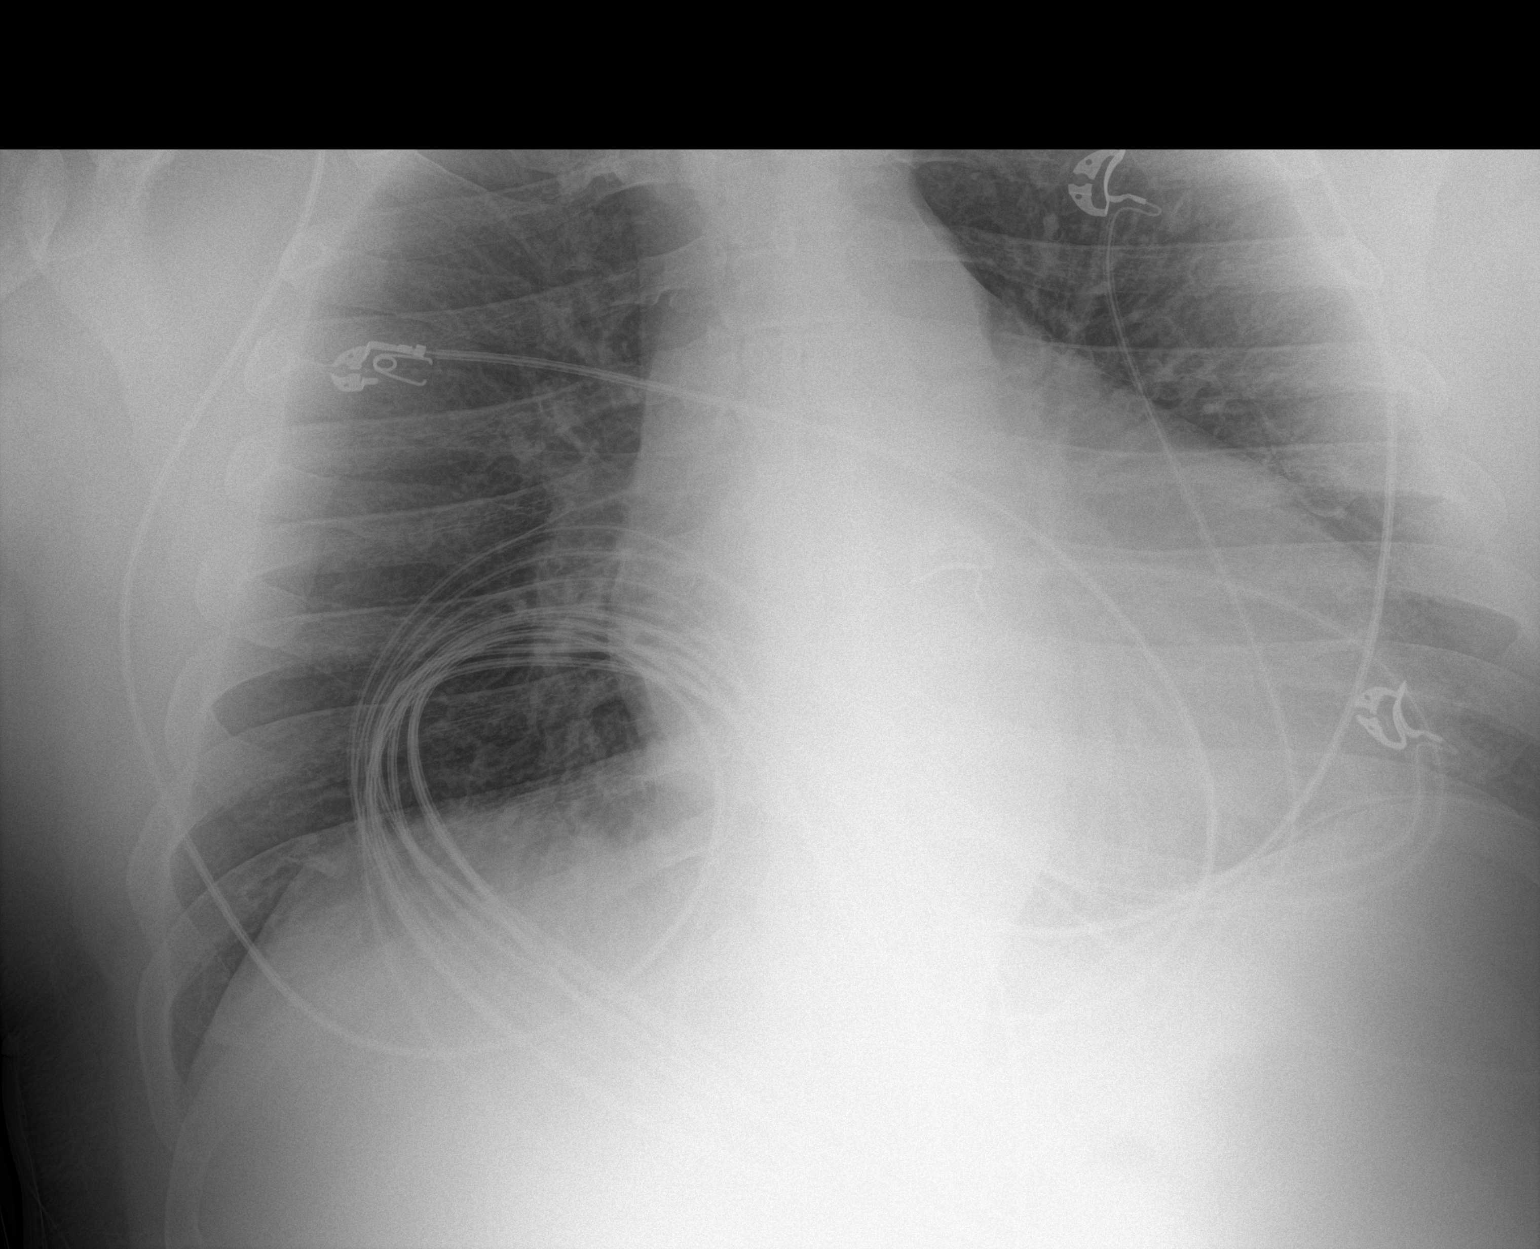

[chest ap (3 of 3)]
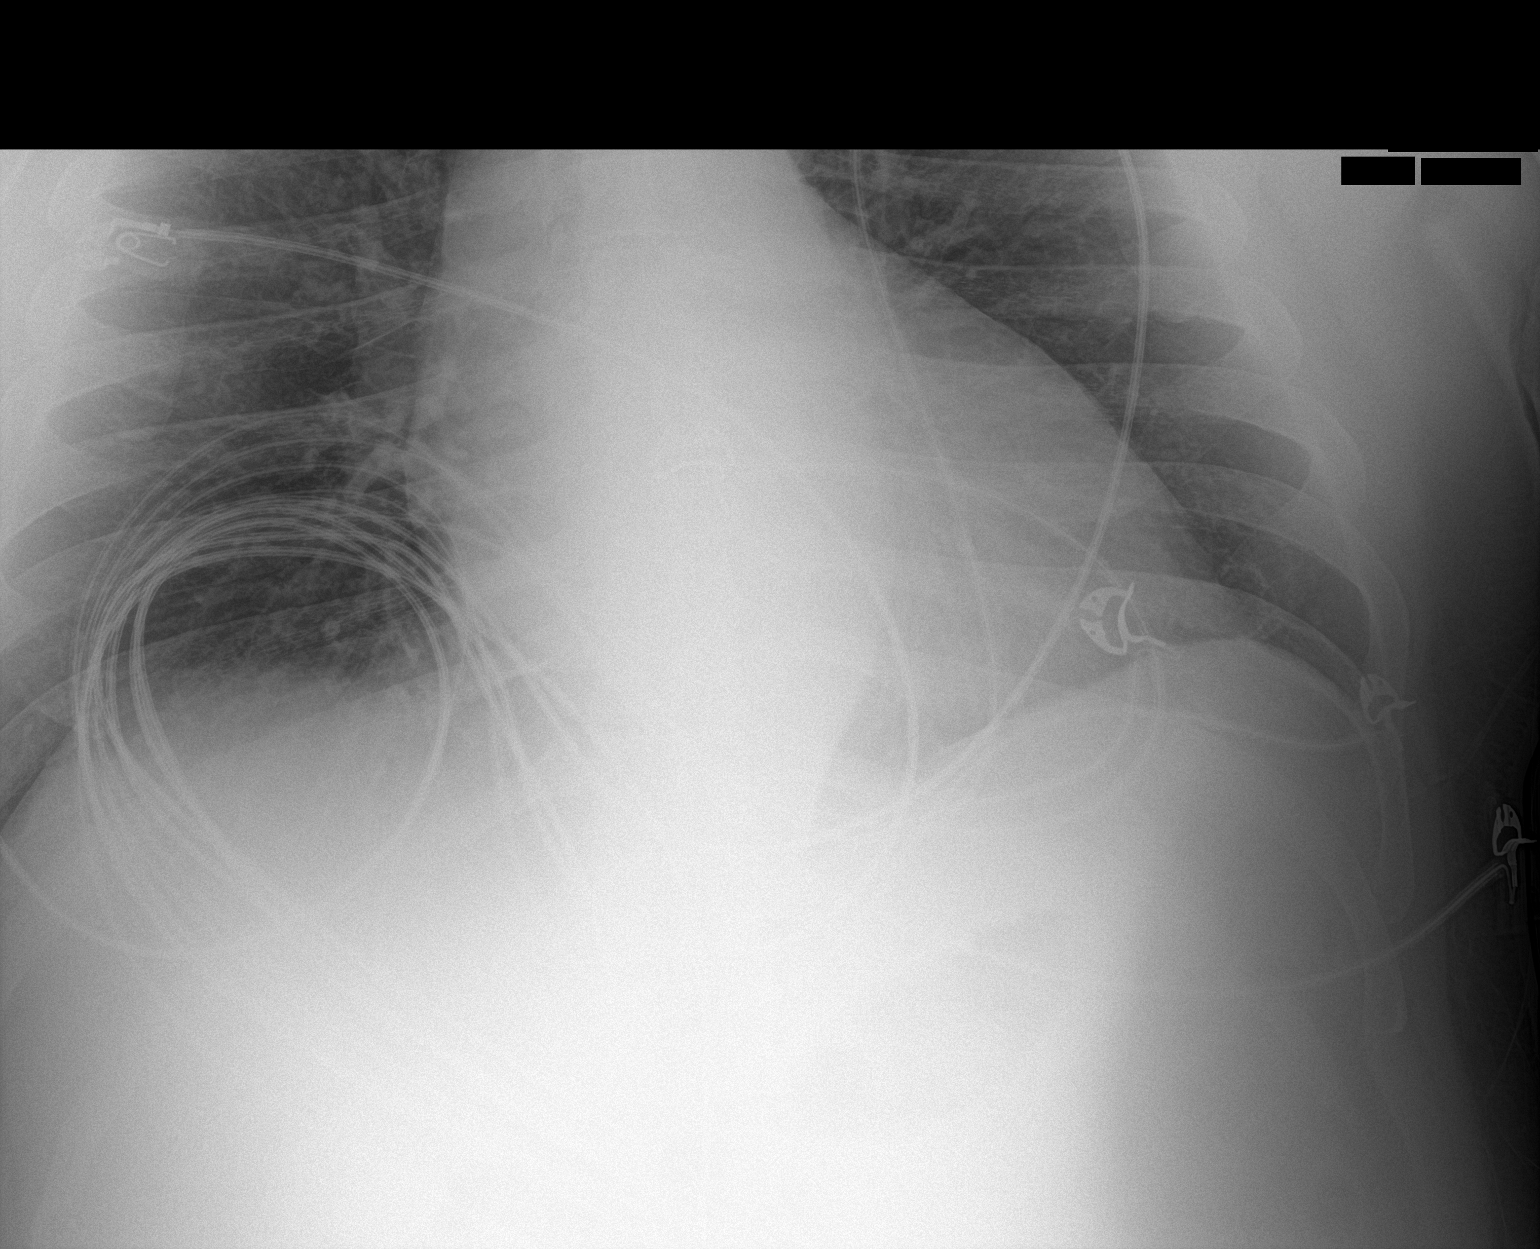

[3 of 3 positions shown; findings below may reference images not displayed]

FINDINGS: Stable cardiomediastinal silhouette. Both lungs are clear. The
visualized skeletal structures are unremarkable.
IMPRESSION: No active disease.

## 2023-02-06 ENCOUNTER — Ambulatory Visit (HOSPITAL_COMMUNITY): Payer: BC Managed Care – PPO

## 2023-02-08 ENCOUNTER — Ambulatory Visit (HOSPITAL_COMMUNITY): Payer: BC Managed Care – PPO

## 2023-02-08 ENCOUNTER — Telehealth: Payer: Self-pay | Admitting: Internal Medicine

## 2023-02-08 NOTE — Telephone Encounter (Signed)
Spoke w/pt regarding Short-term disability paperwork received [employer] on 02-06-23. Pt states this is referencing an extension from a previous claim (July 2024). Informed pt that 2 forms are needed for completion + $29 processing fee (cash, check, or money order) & that Dr. Wyline Mood will be back in office next week.  Will speak w/supervisor regarding necessary fee - pt states none of that process was done previously.  Paperwork left in Dr. Verna Czech mailbox.  Dorma Russell, 02-08-23

## 2023-02-10 ENCOUNTER — Ambulatory Visit (HOSPITAL_COMMUNITY): Payer: BC Managed Care – PPO

## 2023-02-13 ENCOUNTER — Ambulatory Visit (HOSPITAL_COMMUNITY): Payer: BC Managed Care – PPO

## 2023-02-15 ENCOUNTER — Ambulatory Visit (HOSPITAL_COMMUNITY): Payer: BC Managed Care – PPO

## 2023-02-17 ENCOUNTER — Telehealth (HOSPITAL_COMMUNITY): Payer: Self-pay

## 2023-02-17 ENCOUNTER — Ambulatory Visit (HOSPITAL_COMMUNITY): Payer: BC Managed Care – PPO

## 2023-02-17 NOTE — Telephone Encounter (Signed)
Called and spoke with pt in regards to rescheduling CR, pt stated that he was about to go in a meeting and would return CR phone call in about 30 mins.    Placed pt ppw back in reschedule bin, if pt does not f/u will close referral on 12/13.

## 2023-02-20 ENCOUNTER — Ambulatory Visit (HOSPITAL_COMMUNITY): Payer: BC Managed Care – PPO

## 2023-02-20 NOTE — Progress Notes (Signed)
ADVANCED HF CLINIC NOTE   Primary Care: Annett Fabian, MD Primary Cardiologist: Maisie Fus, MD HF Cardiologist: Dr. Shirlee Latch  HPI: Joseph Ray. [name] is a 47 y.o. male with a hx of chronic combined CHF, HTN, CAD, obesity, and OSA.   Admitted 6/23 with nSTEMI. LHC showed persistent thrombus in left circumflex, not amenable to PCI. Treated medically with indefinite DAPT. Echo showed EF 30-35%.    Followed closely with Dr. Wyline Mood, with plans for repeat echo and referral to EP for ICD if EF not > 35%.    Admitted 7/24 with a/c HF, 2/2 medication and lifestyle noncompliance. Diuresed with IV lasix. Echo showed EF 20-25%, mild LVH, G1DD, normal RV, moderate MR. Did not pursue ischemic eval as he did not have CP or acute ECG changes. GDMT titrated and he was discharged home, weight 312 lbs.   Seen in [facility] for post hospital follow up 8/24. Stable NYHA I symptoms, euvolemic. PVCs on ECG and Zio placed; GDMT titrated. Referred to AHF clinic.   cMRI (11/24) showed LVEF 18%, RVEF 45%, moderate MR, basel to mid inferolateral LGE but not enough to explain extent of CM.   Today he returns for HF follow up. Overall feeling fine. Denies increasing SOB, CP, dizziness, edema, or PND/Orthopnea. Appetite ok. No fever or chills. Weight at home 293 pounds. Taking all medications. Takes Lasix 2x/week. BP at home 123/74. Stopped smoking tobacco/THC, ETOH > 1 year ago. BP at home 123/72. Recently mailed back Zio patch. Unable to tolerate CPAP.  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  Labs (7/24): K 3.4, creatinine 1.12 Labs (8/24): K 4.0, creatinine 1.09 Labs (9/24): K 3.9, creatinine 1.08   Cardiac Testing   - cMRI (11/24) showed LVEF 18%, RVEF 45%, moderate MR, basel to mid inferolateral  LGE but not enough to explain extent of CM.    - Echo (7/24): EF 20-25%, mild LVH, G1DD   - LHC (6/23): persistent thrombus in the left circumflex not amenable to PCI    - Echo (6/23): EF 30-35%, G2DD, RV normal, mild to moderate MR   - Ltd echo (12/21): EF 30-35%, moderate LVH, G1DD     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 08/13/2021   Need for assessment for sleep apnea    NSTEMI (non-ST elevated myocardial infarction) (HCC)    Prediabetes    Current Outpatient Medications  Medication Sig Dispense Refill   aspirin EC 81 MG tablet Take 1 tablet (81 mg total) by mouth daily. Swallow whole. 30 tablet 12   atorvastatin (LIPITOR) 80 MG tablet Take 1 tablet (80 mg total) by mouth daily. 90 tablet 2   carvedilol (COREG) 6.25 MG tablet Take 1.5 tablets (9.375 mg total) by mouth 2 (two) times daily with a meal. 180 tablet 2   furosemide (LASIX) 20 MG tablet Take 1 tablet (20 mg total) by mouth daily. Please check you weight daily. If you (1) notice an increase of 2 pounds daily or 5 pounds in a week in your weight, (2) increase swelling in your  legs (3) feeling short of breath when sleeping or laying flat. You can take an additional dose. If you see these changes, please call us at the IM clinic and schedule an appointment.    Max daily dose 20 mg. 90 tablet 3   nitroGLYCERIN (NITROSTAT) 0.4 MG SL tablet Place 1 tablet (0.4 mg total) under the tongue every 5 (five) minutes as needed. (Patient taking differently: Place 0.4 mg under the tongue every 5 (five) minutes as needed for chest pain.) 25 tablet 3   potassium chloride SA (KLOR-CON M) 20 MEQ tablet Take 1 tablet (20 mEq total) by mouth daily. 30 tablet 5   sacubitril-valsartan (ENTRESTO) 24-26 MG Take 1 tablet by mouth  2 (two) times daily. 60 tablet 2   spironolactone (ALDACTONE) 25 MG tablet Take 1 tablet (25 mg total) by mouth daily. 30 tablet 5   No current facility-administered medications for this encounter.   Facility-Administered Medications Ordered in Other Encounters  Medication Dose Route Frequency Provider Last Rate Last Admin   perflutren lipid microspheres (DEFINITY) IV suspension  1-10 mL Intravenous PRN Laurey Morale, MD   5 mL at 02/22/23 1503   Allergies  Allergen Reactions   Penicillins Rash    Has patient had a PCN reaction causing immediate rash, facial/tongue/throat swelling, SOB or lightheadedness with hypotension: NoNo Has patient had a PCN reaction causing severe rash involving mucus membranes or skin necrosis: NoNo Has patient had a PCN reaction that required hospitalization NoNo Has patient had a PCN reaction occurring within the last 10 years: NoNo If all of the above answers are "NO", then may proceed with Cephalosporin    Social History   Socioeconomic History   Marital status: Single    Spouse name: Not on file   Number of children: 3   Years of education: Not on file   Highest education level: High school graduate  Occupational History   Not on file  Tobacco Use   Smoking status: Former    Current packs/day: 0.15    Average packs/day: 0.2 packs/day for 15.0 years (2.3 ttl pk-yrs)    Types: Cigarettes   Smokeless tobacco: Never   Tobacco comments:    Stopped smoking in May 2023.  Vaping Use   Vaping status: Never Used  Substance and Sexual Activity   Alcohol use: Not Currently   Drug use: No   Sexual activity: Not on file  Other Topics Concern   Not on file  Social History Narrative   Not on file   Social Determinants of Health   Financial Resource Strain: Low Risk  (10/11/2022)   Overall Financial Resource Strain (CARDIA)    Difficulty of Paying Living Expenses: Not very hard  Food Insecurity: No Food Insecurity (10/09/2022)   Hunger Vital Sign     Worried About Running Out of Food in the Last Year: Never true    Ran Out of Food in the Last Year: Never true  Transportation Needs: No Transportation Needs (10/11/2022)   PRAPARE - Administrator, Civil Service (Medical): No    Lack of Transportation (Non-Medical): No  Physical Activity: Not on file  Stress: Not on file  Social Connections: Unknown (07/16/2021)   Received from [facility]   Social Network    Social Network: Not on file  Intimate Partner Violence: Not At Risk (10/09/2022)   Humiliation, Afraid, Rape, and Kick questionnaire    Fear of Current or Ex-Partner: No    Emotionally Abused:  No    Physically Abused: No    Sexually Abused: No   Family History  Problem Relation Age of Onset   Hypertension Mother    Heart disease Father    Hypertension Father    Hypertension Brother    Cancer Paternal Aunt    Hypertension Paternal Grandfather    Heart disease Paternal Grandfather    BP (!) 144/98   Pulse 95   Wt 134.7 kg (297 lb)   SpO2 96%   BMI 37.12 kg/m   Wt Readings from Last 3 Encounters:  02/22/23 134.7 kg (297 lb)  11/17/22 (!) 145.1 kg (319 lb 12.8 oz)  10/26/22 (!) 141.9 kg (312 lb 12.8 oz)   PHYSICAL EXAM: General:  NAD. No resp difficulty HEENT: Normal Neck: Supple. No JVD. Carotids 2+ bilat; no bruits. No lymphadenopathy or thryomegaly appreciated. Cor: PMI nondisplaced. Regular rate & rhythm. No rubs, gallops or murmurs. Lungs: Clear Abdomen: Soft, nontender, nondistended. No hepatosplenomegaly. No bruits or masses. Good bowel sounds. Extremities: No cyanosis, clubbing, rash, edema Neuro: Alert & oriented x 3, cranial nerves grossly intact. Moves all 4 extremities w/o difficulty. Affect pleasant.  ASSESSMENT & PLAN: 1. Chronic Systolic Heart Failure: NICM, felt to be 2/2 uncontrolled HTN. Ltd Echo (12/21): EF 30-35%, moderate LVH, G1DD. Echo (6/23): EF 30-35%, G2DD, RV normal, mild to moderate MR. LHC (6/23): persistent  thrombus in the left circumflex not amenable to PCI. Echo (7/24): EF 20-25%, mild LVH, G1DD. cMRI (11/24) showed LVEF 18%, RVEF 45%, moderate MR, basel to mid inferolateral LGE but not enough to explain extent of CM. Echo today showed EF remains low at 25%.  Today, NYHA I. He is not volume overloaded.  - Restart Farxiga 10 mg daily. - Continue Entresto 24/26 mg bid. BMET and BNP today. - Continue spiro 25 mg daily. - Continue Lasix 20 mg daily. - EF remains < 35%, refer to EP for ICD consideration. With NYHA Class 1 symptoms, will need CPX first.   - He has been referred to CR.  2. PVCs: Previously compliant with CPAP. - 2 week Zio completed, awaiting results. - Continue Coreg. 3. CAD: LHC (6/23): mid Cx 100% stenosed, heavy thrombus, not PCI amenable - No chest pain - Continue DAPT indefinitely. - Continue statin.  4. HTN: BP elevated but generally well-controlled at home. - No change in BP-active meds today. - Continue to check BP at home and log. 5. HLD: LDL 137, goal < 55 - Continue atorvastatin 80 mg daily. Check lipids today. 6. OSA: Struggling with tolerating CPAP mask. - Follow up with ENT regarding candidacy for Inspire device.   Follow up in 2 months with Dr. Shirlee Latch.  Prince Rome, FNP-BC 02/22/23  He has STD paperwork to be faxed from his employer. OK to keep out of work until follow up with Dr. Shirlee Latch

## 2023-02-21 ENCOUNTER — Telehealth (HOSPITAL_COMMUNITY): Payer: Self-pay

## 2023-02-21 NOTE — Telephone Encounter (Signed)
Called to confirm/remind patient of their appointment at the [facility] on 02/22/23.   Patient reminded to bring all medications and/or complete list.  Confirmed patient has transportation. Gave directions, instructed to utilize valet parking.  Confirmed appointment prior to ending call.

## 2023-02-22 ENCOUNTER — Ambulatory Visit (HOSPITAL_COMMUNITY)
Admission: RE | Admit: 2023-02-22 | Discharge: 2023-02-22 | Disposition: A | Discharge status: Home / Self Care | Payer: BC Managed Care – PPO | Source: Ambulatory Visit | Attending: Cardiology | Admitting: Cardiology

## 2023-02-22 ENCOUNTER — Encounter: Payer: Self-pay | Admitting: *Deleted

## 2023-02-22 ENCOUNTER — Ambulatory Visit (HOSPITAL_BASED_OUTPATIENT_CLINIC_OR_DEPARTMENT_OTHER)
Admission: RE | Admit: 2023-02-22 | Discharge: 2023-02-22 | Disposition: A | Discharge status: Home / Self Care | Payer: BC Managed Care – PPO | Source: Ambulatory Visit | Attending: Cardiology | Admitting: Cardiology

## 2023-02-22 ENCOUNTER — Ambulatory Visit (HOSPITAL_COMMUNITY): Payer: BC Managed Care – PPO

## 2023-02-22 ENCOUNTER — Encounter (HOSPITAL_COMMUNITY): Payer: Self-pay

## 2023-02-22 VITALS — BP 144/98 | HR 95 | Wt 297.0 lb

## 2023-02-22 DIAGNOSIS — Z8616 Personal history of COVID-19: Secondary | ICD-10-CM | POA: Diagnosis not present

## 2023-02-22 DIAGNOSIS — I110 Hypertensive heart disease with heart failure: Secondary | ICD-10-CM | POA: Diagnosis not present

## 2023-02-22 DIAGNOSIS — G4733 Obstructive sleep apnea (adult) (pediatric): Secondary | ICD-10-CM | POA: Diagnosis not present

## 2023-02-22 DIAGNOSIS — Z79899 Other long term (current) drug therapy: Secondary | ICD-10-CM | POA: Insufficient documentation

## 2023-02-22 DIAGNOSIS — I493 Ventricular premature depolarization: Secondary | ICD-10-CM | POA: Insufficient documentation

## 2023-02-22 DIAGNOSIS — I252 Old myocardial infarction: Secondary | ICD-10-CM | POA: Diagnosis not present

## 2023-02-22 DIAGNOSIS — I2510 Atherosclerotic heart disease of native coronary artery without angina pectoris: Secondary | ICD-10-CM

## 2023-02-22 DIAGNOSIS — E785 Hyperlipidemia, unspecified: Secondary | ICD-10-CM | POA: Insufficient documentation

## 2023-02-22 DIAGNOSIS — I5022 Chronic systolic (congestive) heart failure: Secondary | ICD-10-CM

## 2023-02-22 DIAGNOSIS — Z006 Encounter for examination for normal comparison and control in clinical research program: Secondary | ICD-10-CM

## 2023-02-22 DIAGNOSIS — I428 Other cardiomyopathies: Secondary | ICD-10-CM | POA: Diagnosis not present

## 2023-02-22 DIAGNOSIS — Z8249 Family history of ischemic heart disease and other diseases of the circulatory system: Secondary | ICD-10-CM | POA: Diagnosis not present

## 2023-02-22 DIAGNOSIS — Z6837 Body mass index (BMI) 37.0-37.9, adult: Secondary | ICD-10-CM | POA: Insufficient documentation

## 2023-02-22 DIAGNOSIS — I10 Essential (primary) hypertension: Secondary | ICD-10-CM | POA: Diagnosis not present

## 2023-02-22 DIAGNOSIS — E669 Obesity, unspecified: Secondary | ICD-10-CM | POA: Insufficient documentation

## 2023-02-22 DIAGNOSIS — Z87891 Personal history of nicotine dependence: Secondary | ICD-10-CM | POA: Insufficient documentation

## 2023-02-22 DIAGNOSIS — E1169 Type 2 diabetes mellitus with other specified complication: Secondary | ICD-10-CM

## 2023-02-22 LAB — BASIC METABOLIC PANEL
Anion gap: 14 (ref 5–15)
BUN: 10 mg/dL (ref 6–20)
CO2: 20 mmol/L — ABNORMAL LOW (ref 22–32)
Calcium: 9.3 mg/dL (ref 8.9–10.3)
Chloride: 98 mmol/L (ref 98–111)
Creatinine, Ser: 1.24 mg/dL (ref 0.61–1.24)
GFR, Estimated: >60 mL/min (ref >60)
Glucose, Bld: 357 mg/dL — ABNORMAL HIGH (ref 70–99)
Potassium: 3.8 mmol/L (ref 3.5–5.1)
Sodium: 132 mmol/L — ABNORMAL LOW (ref 135–145)

## 2023-02-22 LAB — LIPID PANEL
Cholesterol: 126 mg/dL (ref 0–200)
HDL: 19 mg/dL — ABNORMAL LOW (ref >40)
LDL Cholesterol: 54 mg/dL (ref 0–99)
Total CHOL/HDL Ratio: 6.6 {ratio}
Triglycerides: 263 mg/dL — ABNORMAL HIGH (ref <150)
VLDL: 53 mg/dL — ABNORMAL HIGH (ref 0–40)

## 2023-02-22 LAB — ECHOCARDIOGRAM COMPLETE
Area-P 1/2: 4.36 cm2
Calc EF: 33.8 %
S' Lateral: 5.7 cm
Single Plane A2C EF: 31.6 %
Single Plane A4C EF: 37.7 %

## 2023-02-22 MED ORDER — PERFLUTREN LIPID MICROSPHERE
1.0000 mL | INTRAVENOUS | Status: DC | PRN
  Administered 2023-02-22: 5 mL via INTRAVENOUS

## 2023-02-22 MED ORDER — DAPAGLIFLOZIN PROPANEDIOL 10 MG PO TABS: 10.0000 mg | ORAL_TABLET | Freq: Every day | ORAL | 3 refills | Status: DC

## 2023-02-22 NOTE — Patient Instructions (Addendum)
Re-start Farxiga 10 mg daily - Rx sent to your pharmacy. Labs today - will call you if abnormal. Referral sent to Cardiology Electrophysiology for ICD (pacemaker/defibrillator) consideration. Their office should call you to schedule first appointment, if not, please call at the number listed below.  Return to see Dr. Shirlee Latch in 2 months - see below. Follow up with your primary care physician. You may call him at [phone]. You may email your disability paperwork to [email or web address]. If you have any question or concerns please call us at [phone].

## 2023-02-22 NOTE — Research (Signed)
SITE: [id number]     Subject #[id number]    Subprotocol: A  Inclusion Criteria  Patients who meet all of the following criteria are eligible for enrollment as study participants:  Yes No  Age > 47 years old X   Eligible to wear Holter Study X    Exclusion Criteria  Patients who meet any of these criteria are not eligible for enrollment as study participants: Yes No  1. Receiving any mechanical (respiratory or circulatory) or renal support therapy at Screening or during Visit #1.  X  2.  Any other conditions that in the opinion of the investigators are likely to prevent compliance with the study protocol or pose a safety concern if the subject participates in the study.  X  3. Poor tolerance, namely susceptible to severe skin allergies from ECG adhesive patch application.  X   Protocol: REV H                                     Residential Zip code*  [address] (First 3 digits ONLY)                                             [facility] Informed Consent   Subject Name: Joseph Ray  Subject met inclusion and exclusion criteria.  The informed consent form, study requirements and expectations were reviewed with the subject. Subject had opportunity to read consent and questions and concerns were addressed prior to the signing of the consent form.  The subject verbalized understanding of the trial requirements.  The subject agreed to participate in the EF-ACT trial and signed the informed consent at 310pm on 02/22/2023.  The informed consent was obtained prior to performance of any protocol-specific procedures for the subject.  A copy of the signed informed consent was given to the subject and a copy was placed in the subject's medical record.   Brunilda Payor          Current Outpatient Medications:    aspirin EC 81 MG tablet, Take 1 tablet (81 mg total) by mouth daily. Swallow whole., Disp: 30 tablet, Rfl: 12   atorvastatin (LIPITOR) 80 MG tablet, Take 1 tablet (80 mg total) by mouth daily., Disp: 90  tablet, Rfl: 2   carvedilol (COREG) 6.25 MG tablet, Take 1.5 tablets (9.375 mg total) by mouth 2 (two) times daily with a meal., Disp: 180 tablet, Rfl: 2   dapagliflozin propanediol (FARXIGA) 10 MG TABS tablet, Take 1 tablet (10 mg total) by mouth daily before breakfast., Disp: 90 tablet, Rfl: 3   furosemide (LASIX) 20 MG tablet, Take 1 tablet (20 mg total) by mouth daily. Please check you weight daily. If you (1) notice an increase of 2 pounds daily or 5 pounds in a week in your weight, (2) increase swelling in your legs (3) feeling short of breath when sleeping or laying flat. You can take an additional dose. If you see these changes, please call us at the IM clinic and schedule an appointment.    Max daily dose 20 mg., Disp: 90 tablet, Rfl: 3   nitroGLYCERIN (NITROSTAT) 0.4 MG SL tablet, Place 1 tablet (0.4 mg total) under the tongue every 5 (five) minutes as needed. (Patient taking differently: Place 0.4 mg under the tongue every 5 (five) minutes as  needed for chest pain.), Disp: 25 tablet, Rfl: 3   potassium chloride SA (KLOR-CON M) 20 MEQ tablet, Take 1 tablet (20 mEq total) by mouth daily., Disp: 30 tablet, Rfl: 5   sacubitril-valsartan (ENTRESTO) 24-26 MG, Take 1 tablet by mouth 2 (two) times daily., Disp: 60 tablet, Rfl: 2   spironolactone (ALDACTONE) 25 MG tablet, Take 1 tablet (25 mg total) by mouth daily., Disp: 30 tablet, Rfl: 5 No current facility-administered medications for this visit.  Facility-Administered Medications Ordered in Other Visits:    perflutren lipid microspheres (DEFINITY) IV suspension, 1-10 mL, Intravenous, PRN, Laurey Morale, MD, 5 mL at 02/22/23 1503

## 2023-02-23 LAB — HEMOGLOBIN A1C
Hgb A1c MFr Bld: >15.5 % — ABNORMAL HIGH (ref 4.8–5.6)
Mean Plasma Glucose: >398 mg/dL

## 2023-02-24 ENCOUNTER — Telehealth (HOSPITAL_COMMUNITY): Payer: Self-pay

## 2023-02-24 ENCOUNTER — Ambulatory Visit (HOSPITAL_COMMUNITY): Payer: BC Managed Care – PPO

## 2023-02-24 DIAGNOSIS — E1169 Type 2 diabetes mellitus with other specified complication: Secondary | ICD-10-CM

## 2023-02-24 DIAGNOSIS — R7309 Other abnormal glucose: Secondary | ICD-10-CM

## 2023-02-24 NOTE — Telephone Encounter (Signed)
Results forwarded to patients PCP  Spoke with patient regarding the following results. Patient made aware and patient verbalized understanding.   Endocrinology referral placed for elevated A1C  Also educated patient on diet changes and importance of glucose control.   Advised patient to call back to office with any issues, questions, or concerns. Patient verbalized understanding.

## 2023-02-24 NOTE — Telephone Encounter (Signed)
-----   Message from Marca Ancona sent at 02/23/2023  1:53 PM EST ----- Markedly higher.  Send to PCP.  Would recommend endocrinology referral.

## 2023-02-27 ENCOUNTER — Ambulatory Visit (HOSPITAL_COMMUNITY): Payer: BC Managed Care – PPO

## 2023-02-27 ENCOUNTER — Ambulatory Visit (INDEPENDENT_AMBULATORY_CARE_PROVIDER_SITE_OTHER): Payer: BC Managed Care – PPO | Admitting: Dietician

## 2023-02-27 ENCOUNTER — Encounter: Payer: Self-pay | Admitting: Student

## 2023-02-27 ENCOUNTER — Ambulatory Visit (INDEPENDENT_AMBULATORY_CARE_PROVIDER_SITE_OTHER): Payer: BC Managed Care – PPO | Admitting: Student

## 2023-02-27 VITALS — BP 137/87 | HR 97 | Ht 75.0 in | Wt 297.6 lb

## 2023-02-27 DIAGNOSIS — I5042 Chronic combined systolic (congestive) and diastolic (congestive) heart failure: Secondary | ICD-10-CM

## 2023-02-27 DIAGNOSIS — Z7984 Long term (current) use of oral hypoglycemic drugs: Secondary | ICD-10-CM

## 2023-02-27 DIAGNOSIS — E119 Type 2 diabetes mellitus without complications: Secondary | ICD-10-CM

## 2023-02-27 DIAGNOSIS — Z794 Long term (current) use of insulin: Secondary | ICD-10-CM

## 2023-02-27 DIAGNOSIS — E1165 Type 2 diabetes mellitus with hyperglycemia: Secondary | ICD-10-CM

## 2023-02-27 MED ORDER — BLOOD GLUCOSE MONITORING SUPPL DEVI: 1.0000 | Freq: Three times a day (TID) | 0 refills | Status: AC

## 2023-02-27 MED ORDER — LANCET DEVICE MISC: 1.0000 | Freq: Three times a day (TID) | 0 refills | Status: AC

## 2023-02-27 MED ORDER — DEXCOM G7 SENSOR MISC: 1.0000 [IU] | 4 refills | Status: DC

## 2023-02-27 MED ORDER — BLOOD GLUCOSE TEST VI STRP: 1.0000 | ORAL_STRIP | Freq: Three times a day (TID) | 0 refills | Status: AC

## 2023-02-27 MED ORDER — PEN NEEDLES 32G X 4 MM MISC: 1.0000 | Freq: Every evening | 1 refills | Status: DC

## 2023-02-27 MED ORDER — INSULIN GLARGINE 100 UNITS/ML SOLOSTAR PEN: 20.0000 [IU] | PEN_INJECTOR | Freq: Every day | SUBCUTANEOUS | 1 refills | Status: DC

## 2023-02-27 MED ORDER — LANCETS MISC. MISC: 1.0000 | Freq: Three times a day (TID) | 0 refills | Status: AC

## 2023-02-27 NOTE — Patient Instructions (Addendum)
Joseph Ray, Joseph Ray you for allowing me to take part in your care today.  Here are your instructions.  1.  Regarding your new onset diabetes, I am starting you on insulin.  Please inject 20 units nightly.  Please keep track of your blood sugars.  I am sending with all the supplies.  2.  I have referred you to an eye doctor, please await phone call to schedule.  3.  Please follow-up in 3 weeks, and we can talk about your insulin and see if we need to titrate this up.  4.  I referred you to our diabetic specialist, please wait for phone call to schedule this appointment.  5.  I am checking your urine to see if you are spilling protein into your urine, I will call you with the results.  Thank you, Dr. Allena Katz  If you have any other questions please contact the internal medicine clinic at [phone] If it is after hours, please call the [place] hospital at [phone] and then ask the person who picks up for the resident on call.

## 2023-02-27 NOTE — Assessment & Plan Note (Signed)
Patient presents to the clinic today for diabetes follow up. Patient recently had appointment at his cardiology office. cMRI (11/24) showed LVEF 18%, RVEF 45%, moderate MR, basel to mid inferolateral LGE but not enough to explain extent of CM. Patient has been referred to cardiac rehab.  On exam today, patient is euvolemic.  Patient reports adherence to his medications.  No acute concern for exacerbation of heart failure.  Plan: -Continue Coreg 9.375 mg twice daily -Continue Farxiga 10 mg daily -Continue Lasix 20 mg daily -Continue Entresto 24-26 point twice daily -Continue Aldactone 25 mg daily -Continue follow-up with cardiologist -Given cardiac MRI results, wonder if patient could benefit from ICD

## 2023-02-27 NOTE — Assessment & Plan Note (Addendum)
Patient presents to the clinic today after being referred from his cardiologist for having an elevated A1c at greater than 15.5. He reports that he has been having polyuria and polydipsia. He notes that in the past he has had prediabetes but he did not have diabetes. Per chart review, his most recent A1c 6.0 one year ago. Now patient had severely elevated A1c at greater than 15.5. He has a history of HFrEF and has been on farxgia, but he is not well controlled. Updated foot exam today and there is no evidence of ulcers. He has good sensation as well. Will start patient on insulin today and patient will get education with diabetes specialist.  Plan: -Start Lantus 20 units nightly -Farxiga 10 mg daily  -Give diabetic supplies for patient to check glucose at home  -Foot exam updated today -Obtain microalbumin creatinine ratio  -Refer to ophthalmology  -Refer to diabetic specialist and nutrition specialist

## 2023-02-27 NOTE — Progress Notes (Signed)
CC: Diabetes follow up   HPI:  Joseph Ray is a 47 y.o. male with a past medical history of NSTEMI on 06/23 and HFrEF who present for concern for diabetes. Please see assessment and plan for full HPI.   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 08/13/2021   Need for assessment for sleep apnea    NSTEMI (non-ST elevated myocardial infarction) (HCC)    Prediabetes      Current Outpatient Medications:    Blood Glucose Monitoring Suppl DEVI, 1 each by Does not apply route in the morning, at noon, and at bedtime. May substitute to any manufacturer covered by patient's insurance., Disp: 1 each, Rfl: 0   Continuous Glucose Sensor (DEXCOM G7 SENSOR) MISC, 1 Units by Does not apply route every 14 (fourteen) days., Disp: 1 each, Rfl: 4   Glucose Blood (BLOOD GLUCOSE TEST STRIPS) STRP, 1 each by In Vitro route in the morning, at noon, and at bedtime. May substitute to any manufacturer covered by patient's insurance., Disp: 100 strip, Rfl: 0   insulin glargine (LANTUS) 100 unit/mL SOPN, Inject 20 Units into the skin at bedtime., Disp: 15 mL, Rfl: 1   Insulin Pen Needle (PEN NEEDLES) 32G X 4 MM MISC, 1 Needle by Does not apply route at bedtime., Disp: 100 each, Rfl: 1   Lancet Device MISC, 1 each by Does not apply route in the morning, at noon, and at bedtime. May substitute to any manufacturer covered by patient's insurance., Disp: 1 each, Rfl: 0   Lancets Misc. MISC, 1 each by Does not apply route in the morning, at noon, and at bedtime. May substitute to any manufacturer covered by patient's insurance., Disp: 100 each, Rfl: 0   aspirin EC 81 MG tablet, Take 1 tablet (81 mg total) by mouth daily. Swallow whole., Disp: 30 tablet, Rfl: 12   atorvastatin (LIPITOR) 80 MG tablet,  Take 1 tablet (80 mg total) by mouth daily., Disp: 90 tablet, Rfl: 2   carvedilol (COREG) 6.25 MG tablet, Take 1.5 tablets (9.375 mg total) by mouth 2 (two) times daily with a meal., Disp: 180 tablet, Rfl: 2   dapagliflozin propanediol (FARXIGA) 10 MG TABS tablet, Take 1 tablet (10 mg total) by mouth daily before breakfast., Disp: 90 tablet, Rfl: 3   furosemide (LASIX) 20 MG tablet, Take 1 tablet (20 mg total) by mouth daily. Please check you weight daily. If you (1) notice an increase of 2 pounds daily or 5 pounds in a week in your weight, (2) increase swelling in your legs (3) feeling short of breath when sleeping or laying flat. You can take an additional dose. If you see these changes, please call us at the IM clinic and schedule an appointment.    Max daily dose 20 mg., Disp: 90 tablet, Rfl: 3   nitroGLYCERIN (NITROSTAT) 0.4 MG SL tablet, Place 1 tablet (0.4 mg total) under the tongue every 5 (five) minutes as needed. (Patient taking differently: Place 0.4 mg under the tongue every 5 (five) minutes as needed for chest pain.), Disp: 25 tablet, Rfl: 3   potassium chloride SA (KLOR-CON M) 20 MEQ tablet, Take 1 tablet (20 mEq total) by mouth daily., Disp: 30 tablet, Rfl: 5   sacubitril-valsartan (ENTRESTO) 24-26  MG, Take 1 tablet by mouth 2 (two) times daily., Disp: 60 tablet, Rfl: 2   spironolactone (ALDACTONE) 25 MG tablet, Take 1 tablet (25 mg total) by mouth daily., Disp: 30 tablet, Rfl: 5  Review of Systems:    Endocrine: Patient endorse polyuria and polydipsia   Physical Exam:  Vitals:   02/27/23 1543  BP: 137/87  Pulse: 97  Weight: 297 lb 9.6 oz (135 kg)  Height: 6\' 3"  (1.905 m)   General: Patient is sitting comfortably in the room  Cardio: Regular rate and rhythm, no murmurs, rubs or gallops. 2+ pulses to bilateral upper and lower extremities  Pulmonary: Clear to ausculation bilaterally with no rales, rhonchi, and crackles  Abdomen: Soft, nontender with normoactive bowel sounds with  no rebound or guarding  Extremities: Bilateral lower extremities with normal sensation, 2+ pedal pulses, and no signs of ulcers    Assessment & Plan:   Type 2 diabetes mellitus, without long-term current use of insulin (HCC) Patient presents to the clinic today after being referred from his cardiologist for having an elevated A1c at greater than 15.5. He reports that he has been having polyuria and polydipsia. He notes that in the past he has had prediabetes but he did not have diabetes. Per chart review, his most recent A1c 6.0 one year ago. Now patient had severely elevated A1c at greater than 15.5. He has a history of HFrEF and has been on farxgia, but he is not well controlled. Updated foot exam today and there is no evidence of ulcers. He has good sensation as well. Will start patient on insulin today and patient will get education with diabetes specialist.  Plan: -Start Lantus 20 units nightly -Farxiga 10 mg daily  -Give diabetic supplies for patient to check glucose at home  -Foot exam updated today -Obtain microalbumin creatinine ratio  -Refer to ophthalmology  -Refer to diabetic specialist and nutrition specialist   Combined systolic and diastolic congestive heart failure [facility]) Patient presents to the clinic today for diabetes follow up. Patient recently had appointment at his cardiology office. cMRI (11/24) showed LVEF 18%, RVEF 45%, moderate MR, basel to mid inferolateral LGE but not enough to explain extent of CM. Patient has been referred to cardiac rehab.  On exam today, patient is euvolemic.  Patient reports adherence to his medications.  No acute concern for exacerbation of heart failure.  Plan: -Continue Coreg 9.375 mg twice daily -Continue Farxiga 10 mg daily -Continue Lasix 20 mg daily -Continue Entresto 24-26 point twice daily -Continue Aldactone 25 mg daily -Continue follow-up with cardiologist -Given cardiac MRI results, wonder if patient could benefit from  ICD   Patient discussed with Dr.  Mercie Eon  Modena Slater, DO PGY-2 Internal Medicine Resident  Pager: [phone]

## 2023-02-28 ENCOUNTER — Telehealth: Payer: Self-pay

## 2023-02-28 ENCOUNTER — Encounter: Payer: Self-pay | Admitting: Cardiology

## 2023-02-28 ENCOUNTER — Telehealth: Payer: Self-pay | Admitting: Internal Medicine

## 2023-02-28 LAB — MICROALBUMIN / CREATININE URINE RATIO
Creatinine, Urine: 47.4 mg/dL
Microalb/Creat Ratio: 27 mg/g{creat} (ref 0–29)
Microalbumin, Urine: 12.7 ug/mL

## 2023-02-28 NOTE — Telephone Encounter (Signed)
Error

## 2023-02-28 NOTE — Telephone Encounter (Signed)
Attempted to call patient, no answer left message requesting a call back.  RN Attempted to call patient to confirm if he is taking Hydralazine Rx, per chart review this is not active on his medication list

## 2023-02-28 NOTE — Progress Notes (Signed)
Internal Medicine Clinic Attending  Case discussed with the resident at the time of the visit.  We reviewed the resident's history and exam and pertinent patient test results.  I agree with the assessment, diagnosis, and plan of care documented in the resident's note.    Patient with new diagnosis of T2DM, A1C>15 Agree with starting once daily long-acting insulin and arranging very close follow up

## 2023-02-28 NOTE — Telephone Encounter (Signed)
Pt c/o medication issue:  1. Name of Medication: Hydralazine 25 mg TID   2. How are you currently taking this medication (dosage and times per day)?   3. Are you having a reaction (difficulty breathing--STAT)?   4. What is your medication issue? [facility] is calling requesting medication be called in for 90 day instead of 30.

## 2023-02-28 NOTE — Patient Instructions (Signed)
Thank you for your visit today!  We talked about:   How to inject insulin using an insulin pen.  Where to inject insulin.  How to dispose of sharps.  You were given a handout about hypoglycemia(LOW blood sugar) .Please review this, it is unlikely, but insulin can cause you have a low blood sugar.    Goals to work on:   Take insulin every day about the same time. Check blood sugars daily. Schedule a follow up appointment with diabetes care and education specialist in 2-3 weeks.   Please feel free to call me anytime.  Lupita Leash [phone]

## 2023-02-28 NOTE — Progress Notes (Signed)
Lab Results  Component Value Date   HGBA1C >15.5 (H) 02/22/2023   HGBA1C 6.0 (H) 08/13/2021   HGBA1C 5.9 (H) 03/12/2020    Diabetes Self-Management Education  Visit Type: First/Initial  Appt. Start Time: 1615 Appt. End Time: 1645  02/28/2023  Mr. [name], identified by name and date of birth, is a 47 y.o. male with a diagnosis of Diabetes: Type 2.   ASSESSMENT  Estimated body mass index is 37.2 kg/m as calculated from the following:   Height as of an earlier encounter on 02/27/23: 6\' 3"  (1.905 m).   Weight as of an earlier encounter on 02/27/23: 297 lb 9.6 oz (135 kg). Wt Readings from Last 10 Encounters:  02/27/23 297 lb 9.6 oz (135 kg)  02/22/23 297 lb (134.7 kg)  11/17/22 (!) 319 lb 12.8 oz (145.1 kg)  10/26/22 (!) 312 lb 12.8 oz (141.9 kg)  10/12/22 (!) 312 lb 6.4 oz (141.7 kg)  06/29/22 (!) 314 lb (142.4 kg)  05/29/22 (!) 312 lb (141.5 kg)  04/20/22 (!) 312 lb 3.2 oz (141.6 kg)  04/04/22 (!) 315 lb (142.9 kg)  10/20/21 (!) 300 lb 3.2 oz (136.2 kg)      Diabetes Self-Management Education - 02/28/23 0900       Visit Information   Visit Type First/Initial      Initial Visit   Diabetes Type Type 2    Date Diagnosed 02/2023   newly diagnosed today   Are you currently following a meal plan? --   not assessed today   Are you taking your medications as prescribed? Yes      Health Coping   How would you rate your overall health? --   not assessed today     Psychosocial Assessment   Patient Belief/Attitude about Diabetes Motivated to manage diabetes    What is the hardest part about your diabetes right now, causing you the most concern, or is the most worrisome to you about your diabetes?   Taking/obtaining medications    Self-care barriers Lack of material resources    Self-management support Doctor's office;CDE visits    Patient Concerns Medication   asked to teach patient how to inject insulin, to begin 20 units glargine daily   Special Needs None     Preferred Learning Style Hands on    Learning Readiness Ready    How often do you need to have someone help you when you read instructions, pamphlets, or other written materials from your doctor or pharmacy? 2 - Rarely    What is the last grade level you completed in school? 12      Pre-Education Assessment   Patient understands monitoring blood glucose, interpreting and using results Needs Instruction    Patient understands prevention, detection, and treatment of acute complications. Needs Instruction      Complications   Last HgB A1C per patient/outside source 15.5 %    How often do you check your blood sugar? 0 times/day (not testing)    Fasting Blood glucose range (mg/dL) >160    Postprandial Blood glucose range (mg/dL) >109    Number of hypoglycemic episodes per month 0    Number of hyperglycemic episodes ( >200mg /dL): Daily    Can you tell when your blood sugar is high? Yes    What do you do if your blood sugar is high? came to doctor    Have you had a dilated eye exam in the past 12 months? No    Have you had  a dental exam in the past 12 months? No    Are you checking your feet? No      Dietary Intake   Breakfast not assessed today      Activity / Exercise   Activity / Exercise Type --   not assessed today     Patient Education   Previous Diabetes Education No    Medications Taught/reviewed insulin/injectables, injection, site rotation, insulin/injectables storage and needle disposal.;Reviewed patients medication for diabetes, action, purpose, timing of dose and side effects.   he gave himself a saline injection and demonstrated proper technique wih insulin pen using teachback   Acute complications Taught prevention, symptoms, and  treatment of hypoglycemia - the 15 rule.      Individualized Goals (developed by patient)   Medications take my medication as prescribed      Post-Education Assessment   Patient understands using medications safely. Comphrehends key points       Outcomes   Expected Outcomes Demonstrated interest in learning. Expect positive outcomes    Future DMSE 2 wks   3 weeks is okay too   Program Status Not Completed             Individualized Plan for Diabetes Self-Management Training:   Learning Objective:  Patient will have a greater understanding of diabetes self-management. Patient education plan is to attend individual and/or group sessions per assessed needs and concerns.   Plan:   Patient Instructions  Thank you for your visit today!  We talked about:   How to inject insulin using an insulin pen.  Where to inject insulin.  How to dispose of sharps.  You were given a handout about hypoglycemia(LOW blood sugar) .Please review this, it is unlikely, but insulin can cause you have a low blood sugar.    Goals to work on:   Take insulin every day about the same time. Check blood sugars daily. Schedule a follow up appointment with diabetes care and education specialist in 2-3 weeks.   Please feel free to call me anytime.  Lupita Leash [phone]   Expected Outcomes:  Demonstrated interest in learning. Expect positive outcomes  Education material provided: Diabetes Resources  If problems or questions, patient to contact team via:  Phone and Email  Future DSME appointment: 2 wks (3 weeks is okay too) Norm Parcel, RD 02/28/2023 9:16 AM.

## 2023-02-28 NOTE — Telephone Encounter (Signed)
Prior Authorization for patient (Dexcom G7 Sensor) came through on cover my meds was submitted with last office notes awaiting approval or denial.  JYN:[id number]

## 2023-03-01 ENCOUNTER — Ambulatory Visit (HOSPITAL_COMMUNITY): Payer: BC Managed Care – PPO

## 2023-03-01 MED ORDER — HYDRALAZINE HCL 25 MG PO TABS: 25.0000 mg | ORAL_TABLET | Freq: Three times a day (TID) | ORAL | 3 refills | Status: DC

## 2023-03-01 NOTE — Telephone Encounter (Signed)
Decision:Approved  [name] ([name]) PA Case ID #: [id number] Rx #: [id number] Need Help? Call us at [phone] Outcome Approved on December 17 by [facility] 2017 NCPDP Request Reference Number: [id number]. DEXCOM G7 MIS SENSOR is approved through 08/29/2023. For further questions, call [employer] at [phone]. Authorization Expiration Date: 08/29/2023 Drug Dexcom G7 Sensor ePA cloud logo Form [facility] Electronic Prior Authorization Form [phone] NCPDP) Original Claim Info 75

## 2023-03-01 NOTE — Telephone Encounter (Signed)
Patient identification verified by 2 forms. Marilynn Rail, RN    Called and spoke to patient  Patient states:   -takes hydralazine 25mg  TID   -is not taking Isordil   -also takes carvedilol and spironolactone  Informed patient:   -per 7/31 discharge summary was advised to discontinue hydralazine Rx   -message sent to Dr. Wyline Mood to confirm if refill appropriate  Patient verbalized understanding, no questions at this time

## 2023-03-01 NOTE — Telephone Encounter (Signed)
Maisie Fus, MD  You15 minutes ago (9:43 AM)   MB Yes that's fine.   You  Maisie Fus, MD; Myriam Jacobson, LPN17 minutes ago (9:41 AM)    Hi Dr. Wyline Mood Is it okay to refill Hydralazine 25mg  TID? Per hospital discharge summer 7/31 he was advised to discontinue but never di   ____________________________________________________ Patient identification verified by 2 forms. Marilynn Rail, RN    Called and spoke to patient  Informed patient:  -per Dr. Wyline Mood okay to continue Hydralazine 25mg  TID  -Rx sent to preferred pharmacy  Patient scheduled for OB 01/16 at 11:40am with Dr. Wyline Mood (overdue follow up)  Patient has no questions or concerns at this time

## 2023-03-02 ENCOUNTER — Telehealth (HOSPITAL_COMMUNITY): Payer: Self-pay

## 2023-03-02 NOTE — Telephone Encounter (Signed)
Paperwork faxed to [phone] to [employer]. Confirmation received.

## 2023-03-03 ENCOUNTER — Ambulatory Visit (HOSPITAL_COMMUNITY): Payer: BC Managed Care – PPO

## 2023-03-03 ENCOUNTER — Encounter: Payer: Self-pay | Admitting: Internal Medicine

## 2023-03-06 ENCOUNTER — Ambulatory Visit (HOSPITAL_COMMUNITY): Payer: BC Managed Care – PPO

## 2023-03-09 ENCOUNTER — Encounter: Payer: Self-pay | Admitting: Student

## 2023-03-10 ENCOUNTER — Ambulatory Visit (HOSPITAL_COMMUNITY): Payer: BC Managed Care – PPO

## 2023-03-13 ENCOUNTER — Ambulatory Visit (HOSPITAL_COMMUNITY): Payer: BC Managed Care – PPO

## 2023-03-13 LAB — HM DIABETES EYE EXAM

## 2023-03-14 ENCOUNTER — Telehealth (HOSPITAL_COMMUNITY): Payer: Self-pay

## 2023-03-14 NOTE — Telephone Encounter (Signed)
 No response from in regards to CR.  Closed referral

## 2023-03-17 ENCOUNTER — Ambulatory Visit (HOSPITAL_COMMUNITY): Payer: BC Managed Care – PPO

## 2023-03-20 ENCOUNTER — Ambulatory Visit: Payer: BC Managed Care – PPO | Admitting: Cardiovascular Disease

## 2023-03-20 ENCOUNTER — Ambulatory Visit (HOSPITAL_COMMUNITY): Payer: BC Managed Care – PPO

## 2023-03-22 ENCOUNTER — Ambulatory Visit (HOSPITAL_COMMUNITY): Payer: BC Managed Care – PPO

## 2023-03-24 ENCOUNTER — Ambulatory Visit (HOSPITAL_COMMUNITY): Payer: BC Managed Care – PPO

## 2023-03-27 ENCOUNTER — Other Ambulatory Visit: Payer: BC Managed Care – PPO

## 2023-03-27 ENCOUNTER — Ambulatory Visit (HOSPITAL_COMMUNITY): Payer: BC Managed Care – PPO

## 2023-03-29 ENCOUNTER — Ambulatory Visit (HOSPITAL_COMMUNITY): Payer: BC Managed Care – PPO

## 2023-03-30 ENCOUNTER — Ambulatory Visit: Payer: BC Managed Care – PPO | Attending: Internal Medicine | Admitting: Internal Medicine

## 2023-03-31 ENCOUNTER — Other Ambulatory Visit: Payer: Self-pay | Admitting: Internal Medicine

## 2023-03-31 DIAGNOSIS — I5021 Acute systolic (congestive) heart failure: Secondary | ICD-10-CM

## 2023-04-10 ENCOUNTER — Telehealth: Payer: Self-pay | Admitting: Internal Medicine

## 2023-04-10 NOTE — Telephone Encounter (Signed)
Spoke w/pt in regards to LT-Dis. paperwork received [employer]. Pt states that previous forms from [employer] (ST-Dis.) have been taken over by [employer] for LT-Dis.   Pt will come into office tomorrow, 04-11-23, to complete 2 forms (ROI & Billing) & pay $29 fee (cash, check, or money order). Pt is aware that Dr. Wyline Mood will return to office week of 04-17-23.  JB, 04-11-23

## 2023-04-11 ENCOUNTER — Ambulatory Visit: Payer: BC Managed Care – PPO | Attending: Internal Medicine | Admitting: Internal Medicine

## 2023-04-11 DIAGNOSIS — I5022 Chronic systolic (congestive) heart failure: Secondary | ICD-10-CM

## 2023-04-24 ENCOUNTER — Ambulatory Visit
Admission: RE | Admit: 2023-04-24 | Discharge: 2023-04-24 | Disposition: A | Discharge status: Home / Self Care | Payer: Self-pay | Source: Ambulatory Visit | Attending: Internal Medicine | Admitting: Internal Medicine

## 2023-04-24 DIAGNOSIS — I5021 Acute systolic (congestive) heart failure: Secondary | ICD-10-CM

## 2023-04-24 MED ORDER — IOPAMIDOL (ISOVUE-370) INJECTION 76%
200.0000 mL | Freq: Once | INTRAVENOUS | Status: AC | PRN
  Administered 2023-04-24: 75 mL via INTRAVENOUS

## 2023-04-26 ENCOUNTER — Encounter: Payer: Self-pay | Admitting: *Deleted

## 2023-05-01 ENCOUNTER — Encounter: Payer: Self-pay | Admitting: Student

## 2023-05-01 ENCOUNTER — Inpatient Hospital Stay (HOSPITAL_COMMUNITY): Admission: RE | Admit: 2023-05-01 | Payer: BC Managed Care – PPO | Source: Ambulatory Visit | Admitting: Cardiology

## 2023-05-02 ENCOUNTER — Ambulatory Visit: Payer: Managed Care, Other (non HMO) | Admitting: Student

## 2023-05-02 ENCOUNTER — Encounter: Payer: Self-pay | Admitting: Student

## 2023-05-02 VITALS — BP 125/102 | HR 79 | Ht 75.0 in | Wt 304.8 lb

## 2023-05-02 DIAGNOSIS — I5042 Chronic combined systolic (congestive) and diastolic (congestive) heart failure: Secondary | ICD-10-CM

## 2023-05-02 DIAGNOSIS — E119 Type 2 diabetes mellitus without complications: Secondary | ICD-10-CM | POA: Diagnosis not present

## 2023-05-02 DIAGNOSIS — G4733 Obstructive sleep apnea (adult) (pediatric): Secondary | ICD-10-CM

## 2023-05-02 DIAGNOSIS — Z7984 Long term (current) use of oral hypoglycemic drugs: Secondary | ICD-10-CM

## 2023-05-02 DIAGNOSIS — Z794 Long term (current) use of insulin: Secondary | ICD-10-CM | POA: Diagnosis not present

## 2023-05-02 LAB — GLUCOSE, CAPILLARY: Glucose-Capillary: 90 mg/dL (ref 70–99)

## 2023-05-02 LAB — POCT GLYCOSYLATED HEMOGLOBIN (HGB A1C): Hemoglobin A1C: 8.4 % — AB (ref 4.0–5.6)

## 2023-05-02 MED ORDER — ZEPBOUND 2.5 MG/0.5ML ~~LOC~~ SOAJ: 2.5000 mg | SUBCUTANEOUS | 0 refills | Status: AC

## 2023-05-02 MED ORDER — SACUBITRIL-VALSARTAN 24-26 MG PO TABS: 1.0000 | ORAL_TABLET | Freq: Two times a day (BID) | ORAL | 2 refills | Status: DC

## 2023-05-02 MED ORDER — FUROSEMIDE 20 MG PO TABS: 20.0000 mg | ORAL_TABLET | Freq: Every day | ORAL | 3 refills | Status: DC

## 2023-05-02 NOTE — Assessment & Plan Note (Signed)
He has a known history of heart failure.  He went to see his cardiologist this afternoon and states that the visit went well.  He is euvolemic on exam today.  No lower extremity edema, no fluid on the lungs.  He denies any shortness of breath or worsening orthopnea.  He is on GDMT including Coreg 9.375 mg twice daily, Farxiga 10 mg daily, Lasix 20 mg daily, Entresto 24-26 twice daily, and Aldactone 25 mg daily.   We will continue the above medications as prescribed.

## 2023-05-02 NOTE — Assessment & Plan Note (Signed)
He had a sleep study in March 2024, which showed severe obstructive sleep apnea (AHI = 74.8/h) with severe oxygen desaturations (Min O2 = 60.0%).  He tried to use CPAP, but found that he continually took the mask off during the night and has been unable to tolerate it.  He still reports snoring, daytime fatigue, and has hypertension.  I feel he would benefit from medication management of his severe obstructive sleep apnea, so I will send in a prescription for Zepbound.  This will additionally benefit him given his diabetes and obesity.  We will schedule a follow-up visit in 4 weeks to see how he is tolerating this medication.

## 2023-05-02 NOTE — Assessment & Plan Note (Addendum)
History of poorly controlled diabetes.  Last A1c was 15.5% in December.  He was started on 20 units of Lantus nightly.  Additionally, he has been taking Farxiga 10 mg daily.  Microalbumin/creatinine collected at his last visit was normal.  He states that this medication regimen has been going well.  He denies any episodes of hypoglycemia.  He says his polyuria and polydipsia has resolved and he in general feels much better.  His fasting blood sugars have usually been around 190.   Hemoglobin A1c today was 8.4%, much improved.  We are adding Zepbound for obstructive sleep apnea, but will otherwise continue on his current diabetes medication regimen and follow-up in 3 months.

## 2023-05-02 NOTE — Progress Notes (Signed)
    CC: Routine Follow Up for diabetes after last office visit 02/27/2023  HPI:  Joseph Ray is a 48 y.o. male with pertinent PMH of NSTEMI, HFrEF, diabetes who presents to the clinic for diabetes follow up. Please see assessment and plan below for further details.  Review of Systems:   Pertinent items noted in HPI and/or A&P.  Physical Exam:  Vitals:   05/02/23 1621 05/02/23 1639  BP: (!) 141/105 (!) 125/102  Pulse: 80 79  Weight: (!) 304 lb 12.8 oz (138.3 kg)   Height: 6\' 3"  (1.905 m)    Well-appearing man, in no distress Overall appears euvolemic; no lower extremity edema, no JVD, lungs clear to auscultation bilaterally Heart rate normal, no murmurs appreciated  Assessment & Plan:   Type 2 diabetes mellitus, without long-term current use of insulin (HCC) History of poorly controlled diabetes.  Last A1c was 15.5% in December.  He was started on 20 units of Lantus nightly.  Additionally, he has been taking Farxiga 10 mg daily.  Microalbumin/creatinine collected at his last visit was normal.  He states that this medication regimen has been going well.  He denies any episodes of hypoglycemia.  He says his polyuria and polydipsia has resolved and he in general feels much better.  His fasting blood sugars have usually been around 190.   Hemoglobin A1c today was 8.4%, much improved.  We are adding Zepbound for obstructive sleep apnea, but will otherwise continue on his current diabetes medication regimen and follow-up in 3 months.  Obstructive sleep apnea He had a sleep study in March 2024, which showed severe obstructive sleep apnea (AHI = 74.8/h) with severe oxygen desaturations (Min O2 = 60.0%).  He tried to use CPAP, but found that he continually took the mask off during the night and has been unable to tolerate it.  He still reports snoring, daytime fatigue, and has hypertension.  I feel he would benefit from medication management of his severe obstructive sleep apnea, so I will  send in a prescription for Zepbound.  This will additionally benefit him given his diabetes and obesity.  We will schedule a follow-up visit in 4 weeks to see how he is tolerating this medication.  Combined systolic and diastolic congestive heart failure (HCC) He has a known history of heart failure.  He went to see his cardiologist this afternoon and states that the visit went well.  He is euvolemic on exam today.  No lower extremity edema, no fluid on the lungs.  He denies any shortness of breath or worsening orthopnea.  He is on GDMT including Coreg 9.375 mg twice daily, Farxiga 10 mg daily, Lasix 20 mg daily, Entresto 24-26 twice daily, and Aldactone 25 mg daily.   We will continue the above medications as prescribed.   Patient seen with Dr. Reubin Milan, MD [facility] Internal Medicine Resident PGY-1 Clinic Phone: [phone] Pager: [phone]

## 2023-05-02 NOTE — Patient Instructions (Signed)
Thank you, Mr.Conroy C Mohammed for allowing [place] to provide your care today. Today we discussed your diabetes, high blood pressure, and sleep apnea.    I placed a order for the medication called Zepbound that we discussed to treat your sleep apnea and diabetes.  This is a once weekly medication.  For your diabetes, please continue taking your insulin 20 units daily.  We have checked hemoglobin A1c today.  I will call you soon as I have the results.  We will follow-up on your diabetes in 3 months.  I am glad your cardiology visit went well today.  Please keep taking all of your medications as prescribed.  I have ordered the following labs for you:   Lab Orders         Glucose, capillary         POC Hbg A1C      Follow up: 4 weeks  We look forward to seeing you next time. Please call our clinic at [phone] if you have any questions or concerns. The best time to call is Monday-Friday from 9am-4pm, but there is someone available 24/7. If after hours or the weekend, call the main hospital number and ask for the Internal Medicine Resident On-Call. If you need medication refills, please notify your pharmacy one week in advance and they will send [place] a request.   Thank you for trusting me with your care. Wishing you the best!   Annett Fabian, MD [facility]

## 2023-05-02 NOTE — Telephone Encounter (Signed)
Per Victorio Palm., Disability paperwork to be completed by Heart Failure clinic. DM contacted office - paperwork scanned into pt's chart via Media.  JB, 05-02-23

## 2023-05-03 ENCOUNTER — Telehealth: Payer: Self-pay

## 2023-05-03 NOTE — Progress Notes (Signed)
 Internal Medicine Clinic Attending  Case discussed with the resident physician at the time of the visit.  We reviewed the patient's history, exam, and pertinent patient test results.  I agree with the assessment, diagnosis, and plan of care documented in the resident's note.

## 2023-05-03 NOTE — Telephone Encounter (Signed)
Prior Authorization for patient (Zepbound 2.5MG /0.5ML pen-injectors) came through on cover my meds was submitted with last office notes and labs awaiting approval or denial.  XBJ:[id number]

## 2023-05-04 NOTE — Telephone Encounter (Signed)
[name] (Key: [id number]) PA Case ID #: [id number] Need Help? Call us at [phone] Outcome Approved on February 19 by [facility] 2017 NCPDP Request Reference Number: [id number]. ZEPBOUND INJ 2.5MG  is approved through 05/02/2024. For further questions, call [employer] at [phone]. Authorization Expiration Date: 05/02/2024 Drug Zepbound 2.5MG /0.5ML pen-injectors ePA cloud logo Form [facility] Electronic Prior Authorization Form [phone] NCPDP)

## 2023-05-05 ENCOUNTER — Telehealth (HOSPITAL_COMMUNITY): Payer: Self-pay | Admitting: Cardiology

## 2023-05-05 NOTE — Telephone Encounter (Signed)
Called patient at [phone] to remind patient of his appt with Dr. Shirlee Latch on Monday 05/08/23 at 11:00 AM.   Front office spoke to patient directly over the telephone and patient confirmed that he will be here for his appt on Monday 05/08/23 to see Dr. Shirlee Latch.

## 2023-05-08 ENCOUNTER — Encounter (HOSPITAL_COMMUNITY): Payer: Self-pay | Admitting: Cardiology

## 2023-05-08 ENCOUNTER — Ambulatory Visit (HOSPITAL_COMMUNITY)
Admission: RE | Admit: 2023-05-08 | Discharge: 2023-05-08 | Disposition: A | Discharge status: Home / Self Care | Payer: Managed Care, Other (non HMO) | Source: Ambulatory Visit | Attending: Cardiology | Admitting: Cardiology

## 2023-05-08 VITALS — BP 118/70 | HR 86 | Wt 296.2 lb

## 2023-05-08 DIAGNOSIS — Z6837 Body mass index (BMI) 37.0-37.9, adult: Secondary | ICD-10-CM | POA: Diagnosis not present

## 2023-05-08 DIAGNOSIS — I5042 Chronic combined systolic (congestive) and diastolic (congestive) heart failure: Secondary | ICD-10-CM | POA: Insufficient documentation

## 2023-05-08 DIAGNOSIS — Z7982 Long term (current) use of aspirin: Secondary | ICD-10-CM | POA: Insufficient documentation

## 2023-05-08 DIAGNOSIS — E669 Obesity, unspecified: Secondary | ICD-10-CM | POA: Insufficient documentation

## 2023-05-08 DIAGNOSIS — Z79899 Other long term (current) drug therapy: Secondary | ICD-10-CM | POA: Diagnosis not present

## 2023-05-08 DIAGNOSIS — I110 Hypertensive heart disease with heart failure: Secondary | ICD-10-CM | POA: Diagnosis not present

## 2023-05-08 DIAGNOSIS — I493 Ventricular premature depolarization: Secondary | ICD-10-CM | POA: Insufficient documentation

## 2023-05-08 DIAGNOSIS — G4733 Obstructive sleep apnea (adult) (pediatric): Secondary | ICD-10-CM | POA: Insufficient documentation

## 2023-05-08 DIAGNOSIS — Z7984 Long term (current) use of oral hypoglycemic drugs: Secondary | ICD-10-CM | POA: Diagnosis not present

## 2023-05-08 DIAGNOSIS — I5022 Chronic systolic (congestive) heart failure: Secondary | ICD-10-CM

## 2023-05-08 DIAGNOSIS — I428 Other cardiomyopathies: Secondary | ICD-10-CM | POA: Diagnosis not present

## 2023-05-08 DIAGNOSIS — I2510 Atherosclerotic heart disease of native coronary artery without angina pectoris: Secondary | ICD-10-CM | POA: Insufficient documentation

## 2023-05-08 DIAGNOSIS — I252 Old myocardial infarction: Secondary | ICD-10-CM | POA: Diagnosis not present

## 2023-05-08 LAB — BASIC METABOLIC PANEL
Anion gap: 9 (ref 5–15)
BUN: 19 mg/dL (ref 6–20)
CO2: 25 mmol/L (ref 22–32)
Calcium: 9.4 mg/dL (ref 8.9–10.3)
Chloride: 104 mmol/L (ref 98–111)
Creatinine, Ser: 1.36 mg/dL — ABNORMAL HIGH (ref 0.61–1.24)
GFR, Estimated: >60 mL/min (ref >60)
Glucose, Bld: 124 mg/dL — ABNORMAL HIGH (ref 70–99)
Potassium: 3.4 mmol/L — ABNORMAL LOW (ref 3.5–5.1)
Sodium: 138 mmol/L (ref 135–145)

## 2023-05-08 LAB — BRAIN NATRIURETIC PEPTIDE: B Natriuretic Peptide: 36.7 pg/mL (ref 0.0–100.0)

## 2023-05-08 MED ORDER — SACUBITRIL-VALSARTAN 49-51 MG PO TABS: 1.0000 | ORAL_TABLET | Freq: Two times a day (BID) | ORAL | 11 refills | Status: DC

## 2023-05-08 NOTE — Progress Notes (Signed)
 [facility] CLINIC NOTE   Primary Care: Annett Fabian, MD HF Cardiologist: Dr. Shirlee Latch  Chief complaint: CHF  HPI: Joseph Ray is a 48 y.o. male with a hx of chronic systolic CHF, HTN, CAD, obesity, and OSA.   Admitted 6/23 with NSTEMI. LHC 99% stenosed culprit LCx with thrombus, not amenable to PCI. Treated medically. Echo showed EF 30-35%.    Admitted 7/24 with CHF likely due to medication and lifestyle noncompliance. Diuresed with IV lasix. Echo showed EF 20-25%, mild LVH, G1DD, normal RV, moderate MR. Did not pursue ischemic evaluation as he did not have CP or acute ECG changes. GDMT titrated and he was discharged home, weight 312 lbs.   cMRI (11/24) showed LVEF 18%, RVEF 45%, moderate MR, basel to mid inferolateral LGE in a pattern suggestive of prior MI  but amount of LGE not enough to explain extent of CM.  Echo in 12/24 showed EF 25-30%, normal RV.   Today he returns for followup of CHF.  He is able to walk 2 miles without shortness of breath.  No chest pain. No lightheadedness.  No orthopnea/PND.  Overall feeling good symptomatically.  Weight is down 1 lb. He has been unable to tolerate CPAP.    ECG (personally reviewed): NSR, LVH  Labs (7/24): K 3.4, creatinine 1.12 Labs (8/24): K 4.0, creatinine 1.09 Labs (9/24): K 3.9, creatinine 1.08 Labs (12/24): LDL 54, K 3.8, creatinine 1.61   PMH: 1. OSA: Unable to tolerate CPAP.  2. HTN 3. CAD: NSTEMI in 6/23, LHC showed 99% stenosed LCx, not amenable to PCI => medical management.  4. 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  - Ltd echo (12/21): EF 30-35%, moderate LVH, G1DD - Echo (6/23): EF 30-35%, G2DD, RV normal, mild to moderate MR - Echo (7/24): EF 20-25%, mild LVH, G1DD - cMRI (11/24) showed LVEF 18%, RVEF 45%, moderate MR, basel to mid inferolateral LGE in a pattern suggestive of prior MI  but amount of LGE not enough to explain extent of CM.  Echo in 12/24 showed EF 25-30%, normal RV.  -  Echo (12/24): EF 25-30%, normal RV 5. PVCs: Zio monitor (9/24) with 3.4% PVCs.    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  No ETOH or smoking currently. No drugs.   Current Outpatient Medications  Medication Sig Dispense Refill   aspirin EC 81 MG tablet Take 1 tablet (81 mg total) by mouth daily. Swallow whole. 30 tablet 12   atorvastatin (LIPITOR) 80 MG tablet Take 1 tablet (80 mg total) by mouth daily. 90 tablet 2   Blood Glucose Monitoring Suppl DEVI 1 each by Does not apply route in the morning, at noon, and at bedtime. May substitute to any manufacturer covered by patient's insurance. 1 each 0   carvedilol (COREG) 6.25 MG tablet Take 1.5 tablets (9.375 mg total) by mouth 2 (two) times daily with a meal. 180 tablet 2   Continuous Glucose Sensor (DEXCOM G7 SENSOR) MISC 1 Units by Does not apply route every 14 (fourteen) days. 1 each 4   dapagliflozin propanediol (FARXIGA) 10 MG TABS tablet Take 1 tablet (10 mg total) by mouth daily before breakfast. 90 tablet 3   furosemide (LASIX) 20 MG tablet Take 1 tablet (20 mg total) by mouth daily. Please check you weight daily. If you (1) notice an increase of 2 pounds daily or 5 pounds in a week in your weight, (2)  increase swelling in your legs (3) feeling short of breath when sleeping or laying flat. You can take an additional dose. If you see these changes, please call us at the IM clinic and schedule an appointment. Max daily dose 20 mg. 90 tablet 3   insulin glargine (LANTUS) 100 unit/mL SOPN Inject 20 Units into the skin at bedtime. 15 mL 1   Insulin Pen Needle (PEN NEEDLES) 32G X 4 MM MISC 1 Needle by Does not apply route at bedtime. 100 each 1   nitroGLYCERIN (NITROSTAT) 0.4 MG SL tablet Place 1 tablet (0.4 mg total) under the tongue every 5 (five) minutes as needed. 25 tablet 3   potassium chloride SA (KLOR-CON M) 20 MEQ tablet Take 1 tablet (20 mEq total) by  mouth daily. 30 tablet 5   sacubitril-valsartan (ENTRESTO) 49-51 MG Take 1 tablet by mouth 2 (two) times daily. 60 tablet 11   spironolactone (ALDACTONE) 25 MG tablet Take 1 tablet (25 mg total) by mouth daily. 30 tablet 5   tirzepatide (ZEPBOUND) 2.5 MG/0.5ML Pen Inject 2.5 mg into the skin once a week for 28 days. (Patient not taking: Reported on 05/08/2023) 2 mL 0   No current facility-administered medications for this encounter.   Allergies  Allergen Reactions   Penicillins Rash    Has patient had a PCN reaction causing immediate rash, facial/tongue/throat swelling, SOB or lightheadedness with hypotension: NoNo Has patient had a PCN reaction causing severe rash involving mucus membranes or skin necrosis: NoNo Has patient had a PCN reaction that required hospitalization NoNo Has patient had a PCN reaction occurring within the last 10 years: NoNo If all of the above answers are "NO", then may proceed with Cephalosporin     BP 118/70   Pulse 86   Wt 134.4 kg (296 lb 3.2 oz)   SpO2 95%   BMI 37.02 kg/m   Wt Readings from Last 3 Encounters:  05/08/23 134.4 kg (296 lb 3.2 oz)  05/02/23 (!) 138.3 kg (304 lb 12.8 oz)  02/27/23 135 kg (297 lb 9.6 oz)   PHYSICAL EXAM: General: NAD Neck: No JVD, no thyromegaly or thyroid nodule.  Lungs: Clear to auscultation bilaterally with normal respiratory effort. CV: Nondisplaced PMI.  Heart regular S1/S2, no S3/S4, no murmur.  No peripheral edema.  No carotid bruit.  Normal pedal pulses.  Abdomen: Soft, nontender, no hepatosplenomegaly, no distention.  Skin: Intact without lesions or rashes.  Neurologic: Alert and oriented x 3.  Psych: Normal affect. Extremities: No clubbing or cyanosis.  HEENT: Normal.   ASSESSMENT & PLAN: 1. 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 Echo (12/21) with EF 30-35%, moderate LVH, G1DD. Echo (6/23) with EF 30-35%, G2DD, RV normal, mild to moderate MR. LHC 6/23  with 99% stenosed left circumflex not amenable to PCI. Echo (7/24) with EF 20-25%, mild LVH, G1DD. cMRI (11/24) showed LVEF 18%, RVEF 45%, moderate MR, basel to mid inferolateral LGE in a pattern suggestive of prior MI  but amount of LGE not enough to explain extent of CM.  Echo in 12/24 showed EF 25-30%, normal RV.  Echo in 12/24 showed EF remained low at 25-30%.  NYHA class I, not volume overloaded on exam.  - Continue Farxiga 10 mg daily. - Increase Entresto to 49/51 bid.  BMET and BNP today, BMET in 10 days. - He can stop hydralazine, apparently had headache with Imdur and unable to take.  - Continue spiro 25 mg daily. - Continue Lasix  20 mg daily. - Continue Coreg 9.375 mg bid.  - EF persistently < 35% with young age, think he should have ICD. We discussed this, he is not ready for ICD yet.  We will continue to titrate up GDMT and will repeat echo in 6 months.  2. PVCs: Zio monitor in 9/24 with 3.4% PVCs.  - Continue Coreg. 3. CAD: LHC in 6/23 showed 99% culprit LCx stenosis with heavy thrombus, not PCI amenable.  No chest pain.  - Continue ASA 81 daily.  - Continue statin. Good LDL in 12/24.  4. HTN: BP controlled.  5. OSA: Cannot tolerate CPAP.  - Follow up with ENT regarding candidacy for Inspire device. 6. Obesity: I will refer to pharmacy clinic to try to get him started on tirzepatide.    Follow up in 3 wks with HF pharmacist for med titration, see APP in 6 wks.   I spent 32 minutes reviewing data, interviewing patient, and organizing the orders/followup.   Marca Ancona 05/08/23

## 2023-05-08 NOTE — Patient Instructions (Signed)
 STOP Hydralazine  INCREASE Entresto to 49/51 mg Twice daily  Labs done today, your results will be available in MyChart, we will contact you for abnormal readings.  Repeat blood work in 10 days.  Please follow up with our heart failure pharmacist in 3 weeks.  Your physician recommends that you schedule a follow-up appointment in: 6 weeks.  If you have any questions or concerns before your next appointment please send [place] a message through [place] or call our office at [phone].    TO LEAVE A MESSAGE FOR THE NURSE SELECT OPTION 2, PLEASE LEAVE A MESSAGE INCLUDING: YOUR NAME DATE OF BIRTH CALL BACK NUMBER REASON FOR CALL**this is important as we prioritize the call backs  YOU WILL RECEIVE A CALL BACK THE SAME DAY AS LONG AS YOU CALL BEFORE 4:00 PM  At the [facility], you and your health needs are our priority. As part of our continuing mission to provide you with exceptional heart care, we have created designated Provider Care Teams. These Care Teams include your primary Cardiologist (physician) and Advanced Practice Providers (APPs- Physician Assistants and Nurse Practitioners) who all work together to provide you with the care you need, when you need it.   You may see any of the following providers on your designated Care Team at your next follow up: Dr Arvilla Meres Dr Marca Ancona Dr. Dorthula Nettles Dr. Clearnce Hasten Amy Filbert Schilder, NP Robbie Lis, [place] [facility], [place] Brynda Peon, NP [place] Lee, NP Clarisa Kindred, NP Karle Plumber, PharmD Enos Fling, PharmD   Please be sure to bring in all your medications bottles to every appointment.    Thank you for choosing [facility]

## 2023-05-22 ENCOUNTER — Other Ambulatory Visit (HOSPITAL_COMMUNITY): Payer: Managed Care, Other (non HMO)

## 2023-05-24 ENCOUNTER — Telehealth: Payer: Self-pay

## 2023-05-24 NOTE — Telephone Encounter (Signed)
 Patient called regarding rx for furosemide, patient is requesting to be switched back to 40 mg. Patient is requesting a call back to discuss.

## 2023-05-24 NOTE — Telephone Encounter (Signed)
 Pharmacy is aware we will not be prescribing phentermine. Per pharmacy the patient will need to call his insurance regarding enrolling in weight watchers and regarding him unable to take phentermine. Patient is aware he will his insurance call.

## 2023-05-24 NOTE — Telephone Encounter (Addendum)
 Received a fax from the pharmacy regarding rx for Zepbound, per patient he stated the pharmacy told him he does not meet the requirement to get the rx he also stated they told him he will have to start the weight watchers program through his job first. Patient stated he has signed up and will give the pharmacy a call to get a update on the process.  Pharmacy is requesting phentermine, patient is okay with starting an alternative medication.  [id number] [place], [place] - [address] AT [address]

## 2023-05-26 NOTE — Addendum Note (Signed)
 Encounter addended by: Howell Rucks, RDCS on: 05/26/2023 1:10 PM  Actions taken: Imaging Exam ended

## 2023-05-29 ENCOUNTER — Ambulatory Visit (HOSPITAL_COMMUNITY)
Admission: RE | Admit: 2023-05-29 | Discharge: 2023-05-29 | Disposition: A | Discharge status: Home / Self Care | Source: Ambulatory Visit | Attending: Cardiology | Admitting: Cardiology

## 2023-05-29 DIAGNOSIS — I5042 Chronic combined systolic (congestive) and diastolic (congestive) heart failure: Secondary | ICD-10-CM | POA: Insufficient documentation

## 2023-05-29 LAB — BASIC METABOLIC PANEL
Anion gap: 8 (ref 5–15)
BUN: 18 mg/dL (ref 6–20)
CO2: 23 mmol/L (ref 22–32)
Calcium: 8.9 mg/dL (ref 8.9–10.3)
Chloride: 106 mmol/L (ref 98–111)
Creatinine, Ser: 1.42 mg/dL — ABNORMAL HIGH (ref 0.61–1.24)
GFR, Estimated: >60 mL/min (ref >60)
Glucose, Bld: 97 mg/dL (ref 70–99)
Potassium: 3.8 mmol/L (ref 3.5–5.1)
Sodium: 137 mmol/L (ref 135–145)

## 2023-05-30 ENCOUNTER — Other Ambulatory Visit (HOSPITAL_COMMUNITY): Payer: Managed Care, Other (non HMO)

## 2023-05-30 ENCOUNTER — Other Ambulatory Visit (HOSPITAL_COMMUNITY): Payer: Self-pay

## 2023-06-05 ENCOUNTER — Other Ambulatory Visit: Payer: Self-pay | Admitting: Student

## 2023-06-13 ENCOUNTER — Telehealth (HOSPITAL_COMMUNITY): Payer: Self-pay

## 2023-06-13 NOTE — Telephone Encounter (Signed)
 Forms faxed to [employer] on 06/13/23 My chart message sent to patient

## 2023-06-16 ENCOUNTER — Telehealth (HOSPITAL_COMMUNITY): Payer: Self-pay | Admitting: *Deleted

## 2023-06-16 NOTE — Telephone Encounter (Signed)
 Called to confirm/remind patient of their appointment at the [facility] on 06/16/23***.   Appointment:   [x] Confirmed  [] Left mess   [] No answer/No voice mail  [] Phone not in service  Patient reminded to bring all medications and/or complete list.  Confirmed patient has transportation. Gave directions, instructed to utilize valet parking.

## 2023-06-19 ENCOUNTER — Encounter (HOSPITAL_COMMUNITY): Payer: Managed Care, Other (non HMO)

## 2023-06-21 ENCOUNTER — Ambulatory Visit: Payer: BC Managed Care – PPO | Admitting: Endocrinology

## 2023-07-13 ENCOUNTER — Other Ambulatory Visit (HOSPITAL_BASED_OUTPATIENT_CLINIC_OR_DEPARTMENT_OTHER): Payer: Self-pay | Admitting: Cardiology

## 2023-07-13 ENCOUNTER — Other Ambulatory Visit: Payer: Self-pay | Admitting: Internal Medicine

## 2023-07-13 ENCOUNTER — Other Ambulatory Visit (HOSPITAL_COMMUNITY): Payer: Self-pay | Admitting: Family Medicine

## 2023-08-01 ENCOUNTER — Telehealth: Payer: Self-pay | Admitting: *Deleted

## 2023-08-01 NOTE — Telephone Encounter (Signed)
 Jonelle Neri, DO  [place] [phone] hours ago (5:00 PM)    You would need to make an appointment with me to be seen for lab work.  Could you call the clinic at [phone].  Thank you.   Dr. Lydia Sams  This MyChart message has not been read.    You routed conversation to Imp Red; Imp Clinical17 hours ago (4:16 PM)    [name], [name]  Imp Clinical17 hours ago (4:04 PM)   CH Patient is requesting lab work only, but I do not see any future orders.  Can you please assist the patient with this?  Thanks,  Charsetta     Joseph Ray  P Imp Admin (supporting Jonelle Neri, DO)6 days ago    Appointment Request From: Joseph Ray   With Provider: Jonelle Neri [facility]]   Preferred Date Range: Any date 08/02/2023 or later   Preferred Times: Any Time   Reason for visit: Office Visit   Health Maintenance Topic:    Comments: Lab work

## 2023-08-01 NOTE — Telephone Encounter (Signed)
 I called pt who stated he knows he hasn't been here in a while and with the meds he's taking, He might need labs. Call transferred to front office - appt schedule w/PCP on 08/14/23.

## 2023-08-14 ENCOUNTER — Ambulatory Visit (INDEPENDENT_AMBULATORY_CARE_PROVIDER_SITE_OTHER): Admitting: Dietician

## 2023-08-14 ENCOUNTER — Ambulatory Visit (INDEPENDENT_AMBULATORY_CARE_PROVIDER_SITE_OTHER): Admitting: Student

## 2023-08-14 ENCOUNTER — Encounter: Payer: Self-pay | Admitting: Student

## 2023-08-14 VITALS — BP 137/100 | HR 81 | Temp 97.8°F | Ht 75.0 in | Wt 302.8 lb

## 2023-08-14 DIAGNOSIS — G4733 Obstructive sleep apnea (adult) (pediatric): Secondary | ICD-10-CM | POA: Diagnosis not present

## 2023-08-14 DIAGNOSIS — E119 Type 2 diabetes mellitus without complications: Secondary | ICD-10-CM

## 2023-08-14 DIAGNOSIS — I5042 Chronic combined systolic (congestive) and diastolic (congestive) heart failure: Secondary | ICD-10-CM

## 2023-08-14 DIAGNOSIS — Z6837 Body mass index (BMI) 37.0-37.9, adult: Secondary | ICD-10-CM

## 2023-08-14 DIAGNOSIS — Z7984 Long term (current) use of oral hypoglycemic drugs: Secondary | ICD-10-CM

## 2023-08-14 DIAGNOSIS — E669 Obesity, unspecified: Secondary | ICD-10-CM | POA: Diagnosis not present

## 2023-08-14 DIAGNOSIS — Z794 Long term (current) use of insulin: Secondary | ICD-10-CM

## 2023-08-14 DIAGNOSIS — Z7985 Long-term (current) use of injectable non-insulin antidiabetic drugs: Secondary | ICD-10-CM

## 2023-08-14 LAB — POCT GLYCOSYLATED HEMOGLOBIN (HGB A1C): Hemoglobin A1C: 6.9 % — AB (ref 4.0–5.6)

## 2023-08-14 LAB — GLUCOSE, CAPILLARY: Glucose-Capillary: 103 mg/dL — ABNORMAL HIGH (ref 70–99)

## 2023-08-14 MED ORDER — ZEPBOUND 2.5 MG/0.5ML ~~LOC~~ SOAJ: 2.5000 mg | SUBCUTANEOUS | 0 refills | Status: DC

## 2023-08-14 MED ORDER — MOUNJARO 2.5 MG/0.5ML ~~LOC~~ SOAJ: 2.5000 mg | SUBCUTANEOUS | 0 refills | Status: DC

## 2023-08-14 MED ORDER — FUROSEMIDE 20 MG PO TABS: 20.0000 mg | ORAL_TABLET | Freq: Every day | ORAL | 3 refills | Status: AC

## 2023-08-14 MED ORDER — INSULIN GLARGINE 100 UNITS/ML SOLOSTAR PEN
17.0000 [IU] | PEN_INJECTOR | Freq: Every day | SUBCUTANEOUS | 11 refills | Status: DC
Start: 2023-08-14 — End: 2023-09-04

## 2023-08-14 MED ORDER — DAPAGLIFLOZIN PROPANEDIOL 10 MG PO TABS: 10.0000 mg | ORAL_TABLET | Freq: Every day | ORAL | 3 refills | Status: DC

## 2023-08-14 NOTE — Patient Instructions (Signed)
 Thank you for your visit today!  We talked about:   Diabetes Remission: Remission has been shown to be due to normalization of the high fat levels inside liver and pancreas, and the only way to achieve this is by major weight loss. There are two main lifestyle changes that people have used to put their diabetes into remission: a low-carbohydrate diet, a low-calorie diet  Foot care, eye care, dental care, kidney care  Dietary changes to stay healthy with weight loss- eat your protein and veggies first  Eat more...  Whole grains: whole grain cereal, whole wheat crackers, whole wheat bread and pasta, old fashioned oats & brown rice Whole fruits-1 cup a day Vegetables- 2-3 cups a day Lowfat Protein: Beans, chicken, [place], yogurt, lean cuts of beef and pork, fish 2-3 times a week Nuts, Seeds and Soy: walnuts, peanuts, pecans, peanut butter, sunflower and pumpkin seeds Foods with omega-3 fatty acids- salmon, tuna, flax seeds, chia seeds Foods with polyphenols (berries, apples, citrus, dark chocolate, tea, coffee)  Eat Less... Refined carb foods (white bread, white rice, some cereals, cake, candy) ?Sugar-sweetened drinks ?Red meat  ?Processed meats (bacon, sausage, cold cuts, chips) ?Margarine, shortening, lard ?Ultra-processed foods (packaged snacks, poultry nuggets, instant noodles, candy, additives)   Goals to work on:   Check your feet daily Bruch your teeth twice a day, floss once a day and see a dentist 1-2x/year Get a diabetes eye exam once a year Get your kidney's checked once a year by checking your urine for protein. Do some resistance exercise 2x/week along with your walking- push ups, planks, pull ups, abdominals, squats, etc  Please feel free to call me anytime.  Joseph Ray [phone] (new number)

## 2023-08-14 NOTE — Patient Instructions (Signed)
 Thank you, Joseph Ray for allowing us  to provide your care today. Today we discussed your diabetes and heart failure.  Your hemoglobin A1c is 6.9% today, which is fantastic.  Keep up the good work.  I have ordered the following labs for you:   Lab Orders         BMP8+Anion Gap         Glucose, capillary         POC Hbg A1C      I will call you as soon as I have the results.   I have ordered the following medication/changed the following medications:   I have decreased your insulin  to 17 units daily  I have sent in a prescription for Zepbound  (tirzepatide ), which you should inject once weekly for the next 4 weeks.  Will follow-up in clinic after that to potentially increase the dose.  This medication is for your diabetes, heart failure, and obstructive sleep apnea.  It should help you lose weight as well.  If you are not able to pick up this prescription, please give our clinic a call.  Follow up: 4 weeks  We look forward to seeing you next time. Please call our clinic at [phone] if you have any questions or concerns. The best time to call is Monday-Friday from 9am-4pm, but there is someone available 24/7. If after hours or the weekend, call the main hospital number and ask for the Internal Medicine Resident On-Call. If you need medication refills, please notify your pharmacy one week in advance and they will send us  a request.   Thank you for trusting me with your care. Wishing you the best!   Dorthy Gavia, MD [facility]

## 2023-08-14 NOTE — Progress Notes (Signed)
 BP Readings from Last 3 Encounters:  08/14/23 (!) 137/100  05/08/23 118/70  05/02/23 (!) 125/102   Wt Readings from Last 10 Encounters:  08/14/23 (!) 302 lb 12.8 oz (137.3 kg)  05/08/23 296 lb 3.2 oz (134.4 kg)  05/02/23 (!) 304 lb 12.8 oz (138.3 kg)  02/27/23 297 lb 9.6 oz (135 kg)  02/22/23 297 lb (134.7 kg)  11/17/22 (!) 319 lb 12.8 oz (145.1 kg)  10/26/22 (!) 312 lb 12.8 oz (141.9 kg)  10/12/22 (!) 312 lb 6.4 oz (141.7 kg)  06/29/22 (!) 314 lb (142.4 kg)  05/29/22 (!) 312 lb (141.5 kg)   Diabetes Self-Management Education  Visit Type: Follow-up (1st visit after initial)  Appt. Start Time: 1355 Appt. End Time: 1435  08/14/2023  Joseph Ray, identified by name and date of birth, is a 48 y.o. male with a diagnosis of Diabetes:  .   ASSESSMENT  Estimated body mass index is 37.85 kg/m as calculated from the following:   Height as of an earlier encounter on 08/14/23: 6\' 3"  (1.905 m).   Weight as of an earlier encounter on 08/14/23: 302 lb 12.8 oz (137.3 kg).  Lab Results  Component Value Date   HGBA1C 6.9 (A) 08/14/2023   HGBA1C 8.4 (A) 05/02/2023   HGBA1C >15.5 (H) 02/22/2023   HGBA1C 6.0 (H) 08/13/2021   HGBA1C 5.9 (H) 03/12/2020       Diabetes Self-Management Education - 08/14/23 1500       Visit Information   Visit Type Follow-up   1st visit after initial     Health Coping   How would you rate your overall health? Excellent   "great" happy with lower a1c     Complications   Last HgB A1C per patient/outside source 6.9 %    How often do you check your blood sugar? 1-2 times/day    Fasting Blood glucose range (mg/dL) 409-811;91-478    Postprandial Blood glucose range (mg/dL) >295;621-308    Number of hypoglycemic episodes per month 0    Number of hyperglycemic episodes ( >200mg /dL): Rare    Can you tell when your blood sugar is high? Yes    What do you do if your blood sugar is high? takes a walk    Have you had a dilated eye exam in the past 12 months? Yes    [facility] [place]   Have you had a dental exam in the past 12 months? Yes   had two teeth pulled last fall , needs to call them for a cleaning   Are you checking your feet? Yes    How many days per week are you checking your feet? 5      Dietary Intake   Breakfast biggest meal    Lunch medium sized meal    Dinner smallest meal      Activity / Exercise   Activity / Exercise Type Light (walking / raking leaves);ADL's    How many days per week do you exercise? 60    How many minutes per day do you exercise? 4    Total minutes per week of exercise 240      Patient Education   Previous Diabetes Education Yes (please comment)   here   Healthy Eating Meal timing in regards to the patients' current diabetes medication.;Reviewed blood glucose goals for pre and post meals and how to evaluate the patients' food intake on their blood glucose level.    Medications Reviewed patients medication for  diabetes, action, purpose, timing of dose and side effects.    Monitoring Identified appropriate SMBG and/or A1C goals.;Daily foot exams;Yearly dilated eye exam   he states he is not interested in CGM   Acute complications Taught prevention, symptoms, and  treatment of hypoglycemia - the 15 rule.;Discussed and identified patients' prevention, symptoms, and treatment of hyperglycemia.    Chronic complications Retinopathy and reason for yearly dilated eye exams;Dental care;Assessed and discussed foot care and prevention of foot problems;Nephropathy, what it is, prevention of, the use of ACE, ARB's and early detection of through urine microalbumia.      Individualized Goals (developed by patient)   Medications take my medication as prescribed   his goal is to come off insulin  and get rid of diabetes     Patient Self-Evaluation of Goals - Patient rates self as meeting previously set goals (% of time)   Medications >75% (most of the time)      Post-Education Assessment   Patient understands incorporating  nutritional management into lifestyle. Comprehends key points    Patient understands using medications safely. Comphrehends key points    Patient understands monitoring blood glucose, interpreting and using results Comprehends key points    Patient understands prevention, detection, and treatment of acute complications. Comprehends key points    Patient understands prevention, detection, and treatment of chronic complications. Comprehends key points      Outcomes   Expected Outcomes Demonstrated interest in learning. Expect positive outcomes    Future DMSE 6 months    Program Status Completed      Subsequent Visit   Since your last visit have you continued or begun to take your medications as prescribed? Yes    Since your last visit have you had your blood pressure checked? Yes    Is your most recent blood pressure lower, unchanged, or higher since your last visit? Higher    Since your last visit have you experienced any weight changes? Gain    Weight Gain (lbs) 5    Since your last visit, are you checking your blood glucose at least once a day? Yes   uses a meter            Individualized Plan for Diabetes Self-Management Training:   Learning Objective:  Patient will have a greater understanding of diabetes self-management. Patient education plan is to attend individual and/or group sessions per assessed needs and concerns.   Plan:   Patient Instructions  Thank you for your visit today!  We talked about:   Diabetes Remission: Remission has been shown to be due to normalization of the high fat levels inside liver and pancreas, and the only way to achieve this is by major weight loss. There are two main lifestyle changes that people have used to put their diabetes into remission: a low-carbohydrate diet, a low-calorie diet  Foot care, eye care, dental care, kidney care  Dietary changes to stay healthy with weight loss- eat your protein and veggies first  Eat more...  Whole  grains: whole grain cereal, whole wheat crackers, whole wheat bread and pasta, old fashioned oats & brown rice Whole fruits-1 cup a day Vegetables- 2-3 cups a day Lowfat Protein: Beans, chicken, [place], yogurt, lean cuts of beef and pork, fish 2-3 times a week Nuts, Seeds and Soy: walnuts, peanuts, pecans, peanut butter, sunflower and pumpkin seeds Foods with omega-3 fatty acids- salmon, tuna, flax seeds, chia seeds Foods with polyphenols (berries, apples, citrus, dark chocolate, tea, coffee)  Eat Less.Aaron AasAaron Aas  Refined carb foods (white bread, white rice, some cereals, cake, candy) ?Sugar-sweetened drinks ?Red meat  ?Processed meats (bacon, sausage, cold cuts, chips) ?Margarine, shortening, lard ?Ultra-processed foods (packaged snacks, poultry nuggets, instant noodles, candy, additives)   Goals to work on:   Check your feet daily Bruch your teeth twice a day, floss once a day and see a dentist 1-2x/year Get a diabetes eye exam once a year Get your kidney's checked once a year by checking your urine for protein. Do some resistance exercise 2x/week along with your walking- push ups, planks, pull ups, abdominals, squats, etc  Please feel free to call me anytime.  Abe Abed [phone] (new number)      Expected Outcomes:  Demonstrated interest in learning. Expect positive outcomes  Education material provided: Diabetes Resources  If problems or questions, patient to contact team via:  Phone and Email  Future DSME appointment: 6 months Marlene Simas, RD 08/14/2023 3:42 PM.

## 2023-08-14 NOTE — Progress Notes (Signed)
 CC: routine follow up  HPI:  [name] is a 48 y.o. male living with a history stated below and presents today for the chief complaint stated above. Please see problem based assessment and plan for additional details.  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 Swallow whole. 30 tablet 12   atorvastatin  (LIPITOR ) 80 MG tablet TAKE 1 TABLET BY MOUTH ONCE DAILY 30 tablet 0   Blood Glucose Monitoring Suppl DEVI 1 each by Does not apply route in the morning, at noon, and at bedtime. May substitute to any manufacturer covered by patient's insurance. 1 each 0   carvedilol  (COREG ) 6.25 MG tablet Take 1.5 tablets (9.375 mg total) by mouth 2 (two) times daily with a meal. 180 tablet 2   Continuous Glucose Sensor (DEXCOM G7 SENSOR) MISC 1 Units by Does not apply route every 14 (fourteen) days. 1 each 4   nitroGLYCERIN  (NITROSTAT ) 0.4 MG SL tablet Place 1 tablet (0.4 mg total) under the tongue every 5 (five) minutes as needed. 25 tablet 3   potassium chloride  SA (KLOR-CON  M) 20 MEQ tablet Take 1 tablet (20 mEq total) by mouth daily. 30 tablet 5   sacubitril -valsartan  (ENTRESTO ) 49-51 MG Take 1 tablet by mouth 2 (two) times daily. 60 tablet 11   spironolactone  (ALDACTONE ) 25 MG tablet TAKE 1 TABLET BY MOUTH ONCE DAILY 60 tablet 1   No current facility-administered medications on file prior to  visit.    Family History  Problem Relation Age of Onset   Hypertension Mother    Heart disease Father    Hypertension Father    Hypertension Brother    Cancer Paternal Aunt    Hypertension Paternal Grandfather    Heart disease Paternal Grandfather     Social History   Socioeconomic History   Marital status: Single    Spouse name: Not on file   Number of children: 3   Years of education: Not on file   Highest education level: High school graduate  Occupational History   Not on file  Tobacco Use   Smoking status: Former    Current packs/day: 0.15    Average packs/day: 0.2 packs/day for 15.0 years (2.3 ttl pk-yrs)    Types: Cigarettes   Smokeless tobacco: Never   Tobacco comments:    Stopped smoking in May 2023.  Vaping Use   Vaping status: Never Used  Substance and Sexual Activity   Alcohol use: Not Currently   Drug use: No   Sexual activity: Not on file  Other Topics Concern   Not on file  Social History Narrative   Not on file   Social Drivers of Health   Financial Resource Strain: Low Risk  (10/11/2022)   Overall Financial Resource Strain (CARDIA)    Difficulty of Paying Living Expenses: Not very  hard  Food Insecurity: No Food Insecurity (10/09/2022)   Hunger Vital Sign    Worried About Running Out of Food in the Last Year: Never true    Ran Out of Food in the Last Year: Never true  Transportation Needs: No Transportation Needs (10/11/2022)   PRAPARE - Administrator, Civil Service (Medical): No    Lack of Transportation (Non-Medical): No  Physical Activity: Not on file  Stress: Not on file  Social Connections: Unknown (07/16/2021)   Received from [facility]   Social Network    Social Network: Not on file  Intimate Partner Violence: Not At Risk (10/09/2022)   Humiliation, Afraid, Rape, and Kick questionnaire    Fear of Current or Ex-Partner: No    Emotionally Abused: No    Physically Abused: No    Sexually Abused: No     Review of Systems: ROS negative except for what is noted on the assessment and plan.  Vitals:   08/14/23 1320 08/14/23 1347  BP: (!) 139/102 (!) 137/100  Pulse: 82 81  Temp: 97.8 F (36.6 C)   TempSrc: Oral   SpO2: 99%   Weight: (!) 302 lb 12.8 oz (137.3 kg)   Height: 6\' 3"  (1.905 m)     Physical Exam: Well-appearing middle-age man, sitting in chair no acute distress Heart rate and rhythm regular, no murmurs, euvolemic on exam, trace LE edema Lungs clear to auscultation bilaterally, normal respiratory rate and effort No focal neurological or musculoskeletal deficits appreciated No skin lesions or rashes  Assessment & Plan:   Patient discussed with Dr. Broadus Canes  Combined systolic and diastolic congestive heart failure (HCC) Appears euvolemic on exam today: No lower extremity edema, no JVD, no crackles heard in the lungs.  He denies any worsening shortness of breath or orthopnea.  He follows with Dr. Mitzie Anda in the advanced heart failure team regularly.  Most recent echo was in December 2024 and showed an ejection fraction of 25-30% with improvement of his mitral regurgitation.  His Entresto  was recently increased to 49/51 mg twice daily, which he seems to be tolerating well.Aaron Aas  He is still taking Coreg  9.375 mg twice daily, Farxiga  10 mg daily, Lasix  20 mg daily, and Aldactone  25 mg daily.   We will recheck a BMP today to evaluate his kidney function and electrolytes.  Will continue his GDMT as listed above. I have sent in refills per his request. Encouraged continued followed up with cardiology heart failure team.  Type 2 diabetes mellitus, without long-term current use of insulin  (HCC) History of poorly controlled diabetes however has been improving recently.  Hemoglobin A1c is 6.9% today down from 8.4% at last visit three months ago. His current medication regimen includes Lantus  20 units daily and Farxiga  10 mg daily.  He denies any worsening polyuria, polydipsia, or  polyneuropathy.  He has another eye doctor appointment this Friday to change his prescription, since his vision has improved with better glucose control.  He has not had any episodes of hypoglycemia.  He says he takes his blood sugar regularly and it usually runs in the 80s-90s fasting. He attributes this to continued efforts to cut out carbohydrates and maximally tolerated exercise several times a week.   We will decrease his Lantus  to 17 units daily given his low fasting blood sugars and add on a GLP-1, which will also help with his CHF, obesity, and OSA.  I have sent in a prescription for Mounjaro 2.5 mg weekly with  a follow up visit in 4 weeks to consider increasing the dose if he tolerates it.  Dorthy Gavia, MD [facility] Internal Medicine, PGY-1 Phone: [phone] Date 08/14/2023 Time 2:29 PM

## 2023-08-14 NOTE — Assessment & Plan Note (Addendum)
 History of poorly controlled diabetes however has been improving recently.  Hemoglobin A1c is 6.9% today down from 8.4% at last visit three months ago. His current medication regimen includes Lantus  20 units daily and Farxiga  10 mg daily.  He denies any worsening polyuria, polydipsia, or polyneuropathy.  He has another eye doctor appointment this Friday to change his prescription, since his vision has improved with better glucose control.  He has not had any episodes of hypoglycemia.  He says he takes his blood sugar regularly and it usually runs in the 80s-90s fasting. He attributes this to continued efforts to cut out carbohydrates and maximally tolerated exercise several times a week.   We will decrease his Lantus  to 17 units daily given his low fasting blood sugars and add on a GLP-1, which will also help with his CHF, obesity, and OSA.  I have sent in a prescription for Mounjaro 2.5 mg weekly with a follow up visit in 4 weeks to consider increasing the dose if he tolerates it.

## 2023-08-14 NOTE — Assessment & Plan Note (Addendum)
 Appears euvolemic on exam today: No lower extremity edema, no JVD, no crackles heard in the lungs.  He denies any worsening shortness of breath or orthopnea.  He follows with Dr. Mitzie Anda in the advanced heart failure team regularly.  Most recent echo was in December 2024 and showed an ejection fraction of 25-30% with improvement of his mitral regurgitation.  His Entresto  was recently increased to 49/51 mg twice daily, which he seems to be tolerating well.Joseph Ray  He is still taking Coreg  9.375 mg twice daily, Farxiga  10 mg daily, Lasix  20 mg daily, and Aldactone  25 mg daily.   We will recheck a BMP today to evaluate his kidney function and electrolytes.  Will continue his GDMT as listed above. I have sent in refills per his request. Encouraged continued followed up with cardiology heart failure team.

## 2023-08-14 NOTE — Progress Notes (Signed)
 Internal Medicine Clinic Attending  Case discussed with the resident at the time of the visit.  We reviewed the resident's history and exam and pertinent patient test results.  I agree with the assessment, diagnosis, and plan of care documented in the resident's note.

## 2023-08-14 NOTE — Addendum Note (Signed)
 Addended by: Dorthy Gavia on: 08/14/2023 04:35 PM   Modules accepted: Level of Service

## 2023-08-14 NOTE — Addendum Note (Signed)
 Addended by: Dorthy Gavia on: 08/14/2023 02:31 PM   Modules accepted: Orders

## 2023-08-15 ENCOUNTER — Ambulatory Visit: Payer: Self-pay | Admitting: Student

## 2023-08-15 ENCOUNTER — Other Ambulatory Visit (HOSPITAL_COMMUNITY): Payer: Self-pay

## 2023-08-15 LAB — BMP8+ANION GAP
Anion Gap: 14 mmol/L (ref 10.0–18.0)
BUN/Creatinine Ratio: 12 (ref 9–20)
BUN: 13 mg/dL (ref 6–24)
CO2: 19 mmol/L — ABNORMAL LOW (ref 20–29)
Calcium: 9.9 mg/dL (ref 8.7–10.2)
Chloride: 105 mmol/L (ref 96–106)
Creatinine, Ser: 1.07 mg/dL (ref 0.76–1.27)
Glucose: 86 mg/dL (ref 70–99)
Potassium: 4.4 mmol/L (ref 3.5–5.2)
Sodium: 138 mmol/L (ref 134–144)
eGFR: 86 mL/min/{1.73_m2} (ref >59)

## 2023-08-17 ENCOUNTER — Ambulatory Visit: Admitting: Endocrinology

## 2023-08-17 ENCOUNTER — Encounter: Payer: Self-pay | Admitting: *Deleted

## 2023-08-22 ENCOUNTER — Encounter: Payer: Self-pay | Admitting: Student

## 2023-08-25 ENCOUNTER — Other Ambulatory Visit (HOSPITAL_COMMUNITY): Payer: Self-pay | Admitting: Family Medicine

## 2023-08-25 ENCOUNTER — Other Ambulatory Visit (HOSPITAL_BASED_OUTPATIENT_CLINIC_OR_DEPARTMENT_OTHER): Payer: Self-pay | Admitting: Cardiology

## 2023-08-29 ENCOUNTER — Encounter: Payer: Self-pay | Admitting: Student

## 2023-08-31 ENCOUNTER — Other Ambulatory Visit: Payer: Self-pay | Admitting: Student

## 2023-08-31 ENCOUNTER — Encounter: Payer: Self-pay | Admitting: Student

## 2023-09-04 ENCOUNTER — Other Ambulatory Visit: Payer: Self-pay | Admitting: Student

## 2023-09-04 ENCOUNTER — Other Ambulatory Visit: Payer: Self-pay

## 2023-09-04 DIAGNOSIS — E119 Type 2 diabetes mellitus without complications: Secondary | ICD-10-CM

## 2023-09-04 MED ORDER — INSULIN GLARGINE 100 UNITS/ML SOLOSTAR PEN
17.0000 [IU] | PEN_INJECTOR | Freq: Every day | SUBCUTANEOUS | 11 refills | Status: DC
Start: 2023-09-04 — End: 2023-11-30

## 2023-09-04 MED ORDER — INSULIN GLARGINE 100 UNITS/ML SOLOSTAR PEN: 17.0000 [IU] | PEN_INJECTOR | Freq: Every day | SUBCUTANEOUS | 11 refills | Status: DC

## 2023-09-04 NOTE — Telephone Encounter (Signed)
 Dr.Zheng sent rx to [facility]. I called and spoke to the pharmacists Jocelyn, she stated that the medication is in route and is usually sent overnight, patient should receive the medication by tomorrow or Wednesday. Patient is aware.

## 2023-09-04 NOTE — Telephone Encounter (Signed)
 Please disregard. Medication refill has been addressed.

## 2023-09-04 NOTE — Telephone Encounter (Signed)
 I called the pharmacy and spoke to Perla, Perla stated that the patient is not able to get lantus  through a retail pharmacy. Perla stated the pharmacy will need to contact his insurance and figure out which mail order pharmacy his insurance is in network with I called and spoke to the patient he stated he has already spoken to his insurance and is not getting any information after being transferred to multiple representatives. I will call the patients insurance to see if I can figure out which mail order pharmacy his insurance is in network with. Patient is aware, I will follow up with him.

## 2023-09-04 NOTE — Telephone Encounter (Addendum)
 Unable to refill rx, rx keeps reverting back to print instead of normal.

## 2023-09-11 NOTE — Progress Notes (Incomplete)
 [facility] NOTE Primary Care: Amilibia, [name], DO HF Cardiologist: Dr. Rolan  HPI: Joseph Ray. Joseph Ray is a 48 y.o. male with a hx of chronic systolic CHF, HTN, CAD, obesity, and OSA.   Admitted 6/23 with NSTEMI. LHC 99% stenosed culprit LCx with thrombus, not amenable to PCI. Treated medically. Echo showed EF 30-35%.    Admitted 7/24 with CHF likely due to medication and lifestyle noncompliance. Diuresed with IV lasix . Echo showed EF 20-25%, mild LVH, G1DD, normal RV, moderate MR. Did not pursue ischemic evaluation as he did not have CP or acute ECG changes. GDMT titrated and he was discharged home, weight 312 lbs.   cMRI (11/24) showed LVEF 18%, RVEF 45%, moderate MR, basel to mid inferolateral LGE in a pattern suggestive of prior MI  but amount of LGE not enough to explain extent of CM.  Echo in 12/24 showed EF 25-30%, normal RV.   Today she returns for HF follow up. Overall feeling fine. He is not SOB with activity. Denies palpitations, abnormal bleeding, CP, dizziness, edema, or PND/Orthopnea. Appetite ok. No fever or chills. Weight at home 298 pounds. Taking all medications. Unable to tolerate CPAP. No ETOH x 2 years. BP at home 130s/80s.   ECG (personally reviewed): NSR, LVH 91 bpm 1 PVC  Labs (7/24): K 3.4, creatinine 1.12 Labs (8/24): K 4.0, creatinine 1.09 Labs (9/24): K 3.9, creatinine 1.08 Labs (12/24): LDL 54, K 3.8, creatinine 8.75 Labs (6/25): K 4.4, creatinine 1.07   PMH: 1. OSA: Unable to tolerate CPAP.  2. HTN 3. CAD: NSTEMI in 6/23, LHC showed 99% stenosed LCx, not amenable to PCI => medical management.  4. 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  - Ltd echo (12/21): EF 30-35%, moderate LVH, G1DD. - Echo (6/23): EF 30-35%, G2DD, RV normal, mild to moderate MR. - Echo (7/24): EF 20-25%, mild LVH, G1DD. - cMRI (11/24) showed LVEF 18%, RVEF 45%, moderate MR, basel to mid inferolateral LGE in a pattern suggestive of prior MI  but amount  of LGE not enough to explain extent of CM.  Echo in 12/24 showed EF 25-30%, normal RV.  - Echo (12/24): EF 25-30%, normal RV. 5. PVCs: Zio monitor (9/24) with 3.4% PVCs.    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  No ETOH or smoking currently. No drugs.   Current Outpatient Medications  Medication Sig Dispense Refill   Accu-Chek Softclix Lancets lancets USE AS DIRECTED EVERY MORNING, NOON, AND BEDTIME 100 each 1   aspirin  EC 81 MG tablet Take 1 tablet (81 mg total) by mouth daily. Swallow whole. 30 tablet 12   atorvastatin  (LIPITOR ) 80 MG tablet TAKE 1 TABLET BY MOUTH DAILY 30 tablet 0   Blood Glucose Monitoring Suppl DEVI 1 each by Does not apply route in the morning, at noon, and at bedtime. May substitute to any manufacturer covered by patient's insurance. 1 each 0   carvedilol  (COREG ) 6.25 MG tablet TAKE 1 AND 1/2 TABLETS BY MOUTH TWICE DAILY WITH MEALS 90 tablet 1   Continuous Glucose Sensor (DEXCOM G7 SENSOR) MISC 1 Units by Does not apply route every 14 (fourteen) days. 1 each 4   dapagliflozin  propanediol (FARXIGA ) 10 MG TABS tablet Take 1 tablet (10 mg total) by mouth daily before breakfast. 90 tablet 3   DROPLET PEN NEEDLES 32G X 4 MM MISC USE AT BEDTIME AS DIRECTED 100 each 1   furosemide  (LASIX )  20 MG tablet Take 1 tablet (20 mg total) by mouth daily. Please check you weight daily. If you (1) notice an increase of 2 pounds daily or 5 pounds in a week in your weight, (2) increase swelling in your legs (3) feeling short of breath when sleeping or laying flat. You can take an additional dose. If you see these changes, please call us  at the IM clinic and schedule an appointment. Max daily dose 20 mg. 90 tablet 3   insulin  glargine (LANTUS ) 100 unit/mL SOPN Inject 17 Units into the skin at bedtime. 15 mL 11   nitroGLYCERIN  (NITROSTAT ) 0.4 MG SL tablet Place 1 tablet (0.4 mg total) under the tongue every 5  (five) minutes as needed. 25 tablet 3   potassium chloride  SA (KLOR-CON  M) 20 MEQ tablet Take 1 tablet (20 mEq total) by mouth daily. 30 tablet 5   sacubitril -valsartan  (ENTRESTO ) 49-51 MG Take 1 tablet by mouth 2 (two) times daily. 60 tablet 11   spironolactone  (ALDACTONE ) 25 MG tablet TAKE 1 TABLET BY MOUTH ONCE DAILY 60 tablet 1   tirzepatide  (MOUNJARO ) 2.5 MG/0.5ML Pen Inject 2.5 mg into the skin once a week. 2 mL 0   No current facility-administered medications for this encounter.   Allergies  Allergen Reactions   Penicillins Rash    Has patient had a PCN reaction causing immediate rash, facial/tongue/throat swelling, SOB or lightheadedness with hypotension: NoNo Has patient had a PCN reaction causing severe rash involving mucus membranes or skin necrosis: NoNo Has patient had a PCN reaction that required hospitalization NoNo Has patient had a PCN reaction occurring within the last 10 years: NoNo If all of the above answers are NO, then may proceed with Cephalosporin    Wt Readings from Last 3 Encounters:  09/14/23 136.1 kg (300 lb)  08/14/23 (!) 137.3 kg (302 lb 12.8 oz)  05/08/23 134.4 kg (296 lb 3.2 oz)   BP (!) 144/85   Pulse 86   Wt 136.1 kg (300 lb)   SpO2 98%   BMI 37.50 kg/m   PHYSICAL EXAM: General:  NAD. No resp difficulty, walked into clinic HEENT: Normal Neck: Supple. No JVD. Cor: Regular rate & rhythm. No rubs, gallops or murmurs. Lungs: Clear Abdomen: Soft, obese, nontender, nondistended.  Extremities: No cyanosis, clubbing, rash, edema Neuro: Alert & oriented x 3, moves all 4 extremities w/o difficulty. Affect pleasant.  ASSESSMENT & PLAN: 1. 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 Echo (12/21) with EF 30-35%, moderate LVH, G1DD. Echo (6/23) with EF 30-35%, G2DD, RV normal, mild to moderate MR. LHC 6/23 with 99% stenosed left circumflex not amenable to PCI. Echo (7/24) with EF 20-25%, mild LVH, G1DD.  cMRI (11/24) showed LVEF 18%, RVEF 45%, moderate MR, basel to mid inferolateral LGE in a pattern suggestive of prior MI but amount of LGE not enough to explain extent of CM. Echo in 12/24 showed EF remained low at 25-30%.  NYHA class I, not volume overloaded on exam.  - Increase Entresto  to 97/103 mg bid. BMET/BNP today, repeat BMET in 10-14 days. - Continue Farxiga  10 mg daily. - Continue spironolactone  25 mg daily. - Continue Lasix  20 mg daily + 20 KCL daily. - Continue Coreg  9.375 mg bid.  - EF persistently < 35% with young age, think he should have ICD. We discussed this, he is not ready for ICD yet.  We will continue to titrate up GDMT. Repeat echo. 2. PVCs: Zio monitor in 9/24 with  3.4% PVCs. Asymptomatic, 1 PVC on ECG today.  - Continue Coreg . 3. CAD: LHC in 6/23 showed 99% culprit LCx stenosis with heavy thrombus, not PCI amenable.  No chest pain.  - Continue ASA 81 daily.  - Continue statin. Good LDL in 12/24.  4. HTN: BP elevated today. - Increase Entresto  to 97/103 mg bid as above 5. OSA: Cannot tolerate CPAP.  - Refer back to Sleep Medicine/Pulm to discuss options, ? Inspire device. 6. Obesity: Body mass index is 37.5 kg/m. - He has just started tirzepatide .    Update echo. Follow up in 3-4 months with Dr. Rolan.  Harlene HERO [facility] FNP-BC 09/14/23

## 2023-09-12 ENCOUNTER — Encounter: Payer: Self-pay | Admitting: Student

## 2023-09-13 ENCOUNTER — Telehealth (HOSPITAL_COMMUNITY): Payer: Self-pay

## 2023-09-13 NOTE — Telephone Encounter (Signed)
 Called to confirm/remind patient of their appointment at the [facility] on 09/14/23.   Appointment:   [x] Confirmed  [] Left mess   [] No answer/No voice mail  [] VM Full/unable to leave message  [] Phone not in service  Patient reminded to bring all medications and/or complete list.  Confirmed patient has transportation. Gave directions, instructed to utilize valet parking.

## 2023-09-14 ENCOUNTER — Encounter (HOSPITAL_COMMUNITY): Payer: Self-pay

## 2023-09-14 ENCOUNTER — Ambulatory Visit (HOSPITAL_COMMUNITY): Payer: Self-pay | Admitting: Family Medicine

## 2023-09-14 ENCOUNTER — Ambulatory Visit (HOSPITAL_COMMUNITY)
Admission: RE | Admit: 2023-09-14 | Discharge: 2023-09-14 | Disposition: A | Discharge status: Home / Self Care | Source: Ambulatory Visit | Attending: Family Medicine | Admitting: Family Medicine

## 2023-09-14 VITALS — BP 144/85 | HR 86 | Wt 300.0 lb

## 2023-09-14 DIAGNOSIS — I10 Essential (primary) hypertension: Secondary | ICD-10-CM

## 2023-09-14 DIAGNOSIS — Z79899 Other long term (current) drug therapy: Secondary | ICD-10-CM | POA: Insufficient documentation

## 2023-09-14 DIAGNOSIS — G4733 Obstructive sleep apnea (adult) (pediatric): Secondary | ICD-10-CM | POA: Diagnosis not present

## 2023-09-14 DIAGNOSIS — Z7984 Long term (current) use of oral hypoglycemic drugs: Secondary | ICD-10-CM | POA: Diagnosis not present

## 2023-09-14 DIAGNOSIS — I2510 Atherosclerotic heart disease of native coronary artery without angina pectoris: Secondary | ICD-10-CM | POA: Insufficient documentation

## 2023-09-14 DIAGNOSIS — E669 Obesity, unspecified: Secondary | ICD-10-CM | POA: Diagnosis not present

## 2023-09-14 DIAGNOSIS — Z6837 Body mass index (BMI) 37.0-37.9, adult: Secondary | ICD-10-CM | POA: Insufficient documentation

## 2023-09-14 DIAGNOSIS — I252 Old myocardial infarction: Secondary | ICD-10-CM | POA: Diagnosis not present

## 2023-09-14 DIAGNOSIS — I493 Ventricular premature depolarization: Secondary | ICD-10-CM | POA: Insufficient documentation

## 2023-09-14 DIAGNOSIS — I5022 Chronic systolic (congestive) heart failure: Secondary | ICD-10-CM | POA: Diagnosis present

## 2023-09-14 DIAGNOSIS — Z7982 Long term (current) use of aspirin: Secondary | ICD-10-CM | POA: Diagnosis not present

## 2023-09-14 DIAGNOSIS — I110 Hypertensive heart disease with heart failure: Secondary | ICD-10-CM | POA: Insufficient documentation

## 2023-09-14 LAB — BASIC METABOLIC PANEL WITH GFR
Anion gap: 10 (ref 5–15)
BUN: 10 mg/dL (ref 6–20)
CO2: 22 mmol/L (ref 22–32)
Calcium: 9.3 mg/dL (ref 8.9–10.3)
Chloride: 106 mmol/L (ref 98–111)
Creatinine, Ser: 0.98 mg/dL (ref 0.61–1.24)
GFR, Estimated: >60 mL/min
Glucose, Bld: 99 mg/dL (ref 70–99)
Potassium: 4.1 mmol/L (ref 3.5–5.1)
Sodium: 138 mmol/L (ref 135–145)

## 2023-09-14 LAB — BRAIN NATRIURETIC PEPTIDE: B Natriuretic Peptide: 43.7 pg/mL (ref 0.0–100.0)

## 2023-09-14 MED ORDER — ATORVASTATIN CALCIUM 80 MG PO TABS: 80.0000 mg | ORAL_TABLET | Freq: Every day | ORAL | 1 refills | Status: AC

## 2023-09-14 MED ORDER — SPIRONOLACTONE 25 MG PO TABS: 25.0000 mg | ORAL_TABLET | Freq: Every day | ORAL | 3 refills | Status: AC

## 2023-09-14 MED ORDER — DAPAGLIFLOZIN PROPANEDIOL 10 MG PO TABS: 10.0000 mg | ORAL_TABLET | Freq: Every day | ORAL | 3 refills | Status: DC

## 2023-09-14 MED ORDER — SACUBITRIL-VALSARTAN 97-103 MG PO TABS: 1.0000 | ORAL_TABLET | Freq: Two times a day (BID) | ORAL | 6 refills | Status: AC

## 2023-09-14 NOTE — Patient Instructions (Addendum)
 Thank you for coming in today  INCREASE Entresto  to 97/103 mg Twice daily  You have been referred to pulmonary they will call to  schedule an appointment  If you had labs drawn today, any labs that are abnormal the clinic will call you   Follow up appointments:as scheduled    Do the following things EVERYDAY: Weigh yourself in the morning before breakfast. Write it down and keep it in a log. Take your medicines as prescribed Eat low salt foods--Limit salt (sodium) to 2000 mg per day.  Stay as active as you can everyday Limit all fluids for the day to less than 2 liters   At the [facility], you and your health needs are our priority. As part of our continuing mission to provide you with exceptional heart care, we have created designated Provider Care Teams. These Care Teams include your primary Cardiologist (physician) and Advanced Practice Providers (APPs- Physician Assistants and Nurse Practitioners) who all work together to provide you with the care you need, when you need it.   You may see any of the following providers on your designated Care Team at your next follow up: Dr Toribio Fuel Dr Ezra Shuck Dr. Ria Gardenia Greig Lenetta, NP Caffie Shed, [place] [facility] [place], [place] Beckey Coe, NP Tinnie Redman, PharmD   Please be sure to bring in all your medications bottles to every appointment.    Thank you for choosing [facility]  If you have any questions or concerns before your next appointment please send us  a message through [place] or call our office at [phone].    TO LEAVE A MESSAGE FOR THE NURSE SELECT OPTION 2, PLEASE LEAVE A MESSAGE INCLUDING: YOUR NAME DATE OF BIRTH CALL BACK NUMBER REASON FOR CALL**this is important as we prioritize the call backs  YOU WILL RECEIVE A CALL BACK THE SAME DAY AS LONG AS YOU CALL BEFORE 4:00 PM

## 2023-09-14 NOTE — Addendum Note (Signed)
 Encounter addended by: Glena Harlene HERO, FNP on: 09/14/2023 1:37 PM  Actions taken: Clinical Note Signed

## 2023-09-19 ENCOUNTER — Other Ambulatory Visit: Payer: Self-pay | Admitting: *Deleted

## 2023-09-19 MED ORDER — MOUNJARO 2.5 MG/0.5ML ~~LOC~~ SOAJ: 2.5000 mg | SUBCUTANEOUS | 1 refills | Status: DC

## 2023-09-26 ENCOUNTER — Telehealth (HOSPITAL_COMMUNITY): Payer: Self-pay

## 2023-09-26 NOTE — Telephone Encounter (Signed)
 FMLA forms e-mailed to patient my chart message sent to inform patient

## 2023-09-28 ENCOUNTER — Ambulatory Visit (HOSPITAL_COMMUNITY)
Admission: RE | Admit: 2023-09-28 | Discharge: 2023-09-28 | Disposition: A | Discharge status: Home / Self Care | Source: Ambulatory Visit | Attending: Cardiology | Admitting: Cardiology

## 2023-09-28 DIAGNOSIS — I5022 Chronic systolic (congestive) heart failure: Secondary | ICD-10-CM | POA: Diagnosis present

## 2023-09-28 LAB — BASIC METABOLIC PANEL WITH GFR
Anion gap: 9 (ref 5–15)
BUN: 13 mg/dL (ref 6–20)
CO2: 25 mmol/L (ref 22–32)
Calcium: 9.2 mg/dL (ref 8.9–10.3)
Chloride: 105 mmol/L (ref 98–111)
Creatinine, Ser: 0.99 mg/dL (ref 0.61–1.24)
GFR, Estimated: >60 mL/min (ref >60)
Glucose, Bld: 97 mg/dL (ref 70–99)
Potassium: 3.8 mmol/L (ref 3.5–5.1)
Sodium: 139 mmol/L (ref 135–145)

## 2023-10-13 ENCOUNTER — Ambulatory Visit (HOSPITAL_COMMUNITY)

## 2023-11-06 ENCOUNTER — Ambulatory Visit (HOSPITAL_COMMUNITY): Attending: Family Medicine

## 2023-11-09 ENCOUNTER — Ambulatory Visit: Payer: Self-pay

## 2023-11-09 NOTE — Progress Notes (Signed)
 Called patient to inform of eye exam showing no diabetic retinopathy. Patient voiced understanding and will be making a follow up appointment next month to follow up Hgb A1c

## 2023-11-15 ENCOUNTER — Other Ambulatory Visit (HOSPITAL_BASED_OUTPATIENT_CLINIC_OR_DEPARTMENT_OTHER): Payer: Self-pay | Admitting: Cardiology

## 2023-11-30 ENCOUNTER — Encounter: Payer: Self-pay | Admitting: Endocrinology

## 2023-11-30 ENCOUNTER — Ambulatory Visit: Admitting: Endocrinology

## 2023-11-30 ENCOUNTER — Ambulatory Visit: Payer: Self-pay | Admitting: Endocrinology

## 2023-11-30 VITALS — BP 136/80 | HR 88 | Resp 20 | Ht 75.0 in | Wt 306.6 lb

## 2023-11-30 DIAGNOSIS — E1169 Type 2 diabetes mellitus with other specified complication: Secondary | ICD-10-CM

## 2023-11-30 DIAGNOSIS — Z794 Long term (current) use of insulin: Secondary | ICD-10-CM

## 2023-11-30 LAB — POCT GLYCOSYLATED HEMOGLOBIN (HGB A1C): Hemoglobin A1C: 6.3 % — AB (ref 4.0–5.6)

## 2023-11-30 MED ORDER — TIRZEPATIDE 5 MG/0.5ML ~~LOC~~ SOAJ: 5.0000 mg | SUBCUTANEOUS | 11 refills | Status: AC

## 2023-11-30 MED ORDER — INSULIN GLARGINE 100 UNITS/ML SOLOSTAR PEN: 15.0000 [IU] | PEN_INJECTOR | Freq: Every day | SUBCUTANEOUS | 4 refills | Status: AC

## 2023-11-30 MED ORDER — DAPAGLIFLOZIN PROPANEDIOL 10 MG PO TABS: 10.0000 mg | ORAL_TABLET | Freq: Every day | ORAL | 3 refills | Status: AC

## 2023-11-30 MED ORDER — DEXCOM G7 SENSOR MISC: 1.0000 | 3 refills | Status: AC

## 2023-11-30 NOTE — Progress Notes (Addendum)
 Outpatient Endocrinology Note Joseph Calloway Andrus, MD   Patient's Name: Joseph Ray    DOB: April 14, 1975    MRN: [id number]                                                    REASON OF VISIT: New consult for type 2 diabetes mellitus  REFERRING PROVIDER: Rolan Ezra RAMAN, MD  PCP: Amilibia, [name], DO  HISTORY OF PRESENT ILLNESS:   Joseph Ray is a 48 y.o. old male with past medical history listed below, is here for new consult for type 2 diabetes mellitus.   Pertinent Diabetes History: Patient is referred to endocrinology for evaluation and management of type 2 diabetes mellitus.  Initial consult on September 30 May 2023.  Patient was diagnosed with type 2 diabetes mellitus in December 2024, hemoglobin A1c was > 15.5% at the time of diagnosis.  He has improved significantly on diet, he started to exercise and was also started on insulin  therapy after the diagnosis.  He has significant improvement of diabetes control over few months.  Hemoglobin A1c today 6.3%.  He had prediabetes prior to diagnosis of diabetes mellitus and was on metformin.  Mounjaro  was added in February 2025.  History of DKA or diabetes related hospitalizations: none  Previous diabetes education: Yes   Family h/o diabetes mellitus: grand mother DM   No personal history of pancreatitis and / or family history of medullary thyroid  carcinoma or MEN 2B syndrome.   Chronic Diabetes Complications : Retinopathy: no. Last ophthalmology exam was done on 02/2023, following with ophthalmology regularly.  Nephropathy: no Peripheral neuropathy: no Coronary artery disease: yes Stroke: no  Relevant comorbidities and cardiovascular risk factors: Obesity: yes Body mass index is 38.32 kg/m.  Hypertension: Yes  Hyperlipidemia : Yes, on statin   Current / Home Diabetic regimen includes:  Lantus  17 units daily. Mounjaro  2.5 mg weekly.  Farxiga  100 mg daily.  Prior diabetic medications:  Glycemic data:   No glucose data  to review.  He has glucometer and has not been checking much lately.  Hypoglycemia: Patient has no hypoglycemic episodes. Patient has hypoglycemia awareness.  Factors modifying glucose control: 1.  Diabetic diet assessment: 3 meals a day. Limit carbohydrates, stopped regular sodas. Zero sugar drinks. Stopped sweets.   2.  Staying active or exercising: walking, work out.   3.  Medication compliance: compliant all of the time.  Interval history: Patient presented to establish endocrinology care for type 2 diabetes mellitus.  He was diagnosed in December.  He has significant improvement in diabetes control, congratulated him.  REVIEW OF SYSTEMS As per history of present illness.   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 08/13/2021   Need for assessment for sleep apnea    NSTEMI (non-ST elevated myocardial infarction) (HCC)    Prediabetes     PAST SURGICAL HISTORY: Past Surgical History:  Procedure Laterality Date   ACHILLES TENDON SURGERY Left 03/25/2013   Procedure: LEFT ACHILLES TENDON REPAIR;  Surgeon: Dempsey JINNY Sensor, MD;  Location: [facility];  Service: Orthopedics;  Laterality:  Left;   LEFT HEART CATH AND CORONARY ANGIOGRAPHY N/A 08/16/2021   Procedure: LEFT HEART CATH AND CORONARY ANGIOGRAPHY;  Surgeon: Dann Candyce RAMAN, MD;  Location: [facility] INVASIVE CV LAB;  Service: Cardiovascular;  Laterality: N/A;    ALLERGIES: Allergies  Allergen Reactions   Penicillins Rash    Has patient had a PCN reaction causing immediate rash, facial/tongue/throat swelling, SOB or lightheadedness with hypotension: NoNo Has patient had a PCN reaction causing severe rash involving mucus membranes or skin necrosis: NoNo Has patient had a PCN  reaction that required hospitalization NoNo Has patient had a PCN reaction occurring within the last 10 years: NoNo If all of the above answers are NO, then may proceed with Cephalosporin     FAMILY HISTORY:  Family History  Problem Relation Age of Onset   Hypertension Mother    Heart disease Father    Hypertension Father    Hypertension Brother    Cancer Paternal Aunt    Hypertension Paternal Grandfather    Heart disease Paternal Grandfather     SOCIAL HISTORY: Social History   Socioeconomic History   Marital status: Single    Spouse name: Not on file   Number of children: 3   Years of education: Not on file   Highest education level: High school graduate  Occupational History   Not on file  Tobacco Use   Smoking status: Former    Current packs/day: 0.15    Average packs/day: 0.2 packs/day for 15.0 years (2.3 ttl pk-yrs)    Types: Cigarettes   Smokeless tobacco: Never   Tobacco comments:    Stopped smoking in May 2023.  Vaping Use   Vaping status: Never Used  Substance and Sexual Activity   Alcohol use: Not Currently   Drug use: No   Sexual activity: Not on file  Other Topics Concern   Not on file  Social History Narrative   Not on file   Social Drivers of Health   Financial Resource Strain: Low Risk  (10/11/2022)   Overall Financial Resource Strain (CARDIA)    Difficulty of Paying Living Expenses: Not very hard  Food Insecurity: No Food Insecurity (10/09/2022)   Hunger Vital Sign    Worried About Running Out of Food in the Last Year: Never true    Ran Out of Food in the Last Year: Never true  Transportation Needs: No Transportation Needs (10/11/2022)   PRAPARE - Administrator, Civil Service (Medical): No    Lack of Transportation (Non-Medical): No  Physical Activity: Not on file  Stress: Not on file  Social Connections: Unknown (07/16/2021)   Received [facility]   Social Network    Social Network: Not on file    MEDICATIONS:   Current Outpatient Medications  Medication Sig Dispense Refill   Accu-Chek Softclix Lancets lancets USE AS DIRECTED EVERY MORNING, NOON, AND BEDTIME 100 each 1   aspirin  EC 81 MG tablet Take 1 tablet (81 mg total) by mouth daily. Swallow whole. 30 tablet 12   atorvastatin  (LIPITOR ) 80 MG tablet Take 1 tablet (80 mg total) by mouth daily. 90 tablet 1   Blood Glucose Monitoring Suppl DEVI 1 each by Does not apply route in the morning, at noon, and at bedtime. May substitute to any manufacturer covered by patient's insurance. 1 each 0   carvedilol  (COREG ) 6.25 MG tablet TAKE 1 AND 1/2 TABLETS BY MOUTH TWICE DAILY WITH MEALS 90 tablet 1   Continuous Glucose Sensor (DEXCOM G7 SENSOR)  MISC 1 Device by Does not apply route continuous. 9 each 3   DROPLET PEN NEEDLES 32G X 4 MM MISC USE AT BEDTIME AS DIRECTED 100 each 1   furosemide  (LASIX ) 20 MG tablet Take 1 tablet (20 mg total) by mouth daily. Please check you weight daily. If you (1) notice an increase of 2 pounds daily or 5 pounds in a week in your weight, (2) increase swelling in your legs (3) feeling short of breath when sleeping or laying flat. You can take an additional dose. If you see these changes, please call us  at the IM clinic and schedule an appointment. Max daily dose 20 mg. 90 tablet 3   nitroGLYCERIN  (NITROSTAT ) 0.4 MG SL tablet Place 1 tablet (0.4 mg total) under the tongue every 5 (five) minutes as needed. 25 tablet 3   potassium chloride  SA (KLOR-CON  M) 20 MEQ tablet Take 1 tablet (20 mEq total) by mouth daily. 30 tablet 5   sacubitril -valsartan  (ENTRESTO ) 97-103 MG Take 1 tablet by mouth 2 (two) times daily. 60 tablet 6   spironolactone  (ALDACTONE ) 25 MG tablet Take 1 tablet (25 mg total) by mouth daily. 90 tablet 3   tirzepatide  (MOUNJARO ) 5 MG/0.5ML Pen Inject 5 mg into the skin once a week. 2 mL 11   dapagliflozin  propanediol (FARXIGA ) 10 MG TABS tablet Take 1 tablet (10 mg total) by mouth daily before breakfast. 90 tablet 3    insulin  glargine (LANTUS ) 100 unit/mL SOPN Inject 15 Units into the skin at bedtime. 15 mL 4   No current facility-administered medications for this visit.    PHYSICAL EXAM: Vitals:   11/30/23 0931  BP: 136/80  Pulse: 88  Resp: 20  SpO2: 95%  Weight: (!) 306 lb 9.6 oz (139.1 kg)  Height: 6' 3 (1.905 m)   Body mass index is 38.32 kg/m.  Wt Readings from Last 3 Encounters:  11/30/23 (!) 306 lb 9.6 oz (139.1 kg)  09/14/23 300 lb (136.1 kg)  08/14/23 (!) 302 lb 12.8 oz (137.3 kg)    General: Well developed, well nourished male in no apparent distress.  HEENT: AT/[place], no external lesions.  Eyes: Conjunctiva clear and no icterus. Neck: Neck supple  Lungs: Respirations not labored Neurologic: Alert, oriented, normal speech Extremities / Skin: Dry.  Psychiatric: Does not appear depressed or anxious  Diabetic Foot Exam - Simple   No data filed    LABS Reviewed Lab Results  Component Value Date   HGBA1C 6.3 (A) 11/30/2023   HGBA1C 6.9 (A) 08/14/2023   HGBA1C 8.4 (A) 05/02/2023   No results found for: FRUCTOSAMINE Lab Results  Component Value Date   CHOL 126 02/22/2023   HDL 19 (L) 02/22/2023   LDLCALC 54 02/22/2023   TRIG 263 (H) 02/22/2023   CHOLHDL 6.6 02/22/2023   Lab Results  Component Value Date   MICRALBCREAT 27 02/27/2023   Lab Results  Component Value Date   CREATININE 0.99 09/28/2023   No results found for: GFR  ASSESSMENT / PLAN  1. Type 2 diabetes mellitus with other specified complication, with long-term current use of insulin  (HCC)   2. Controlled type 2 diabetes mellitus with other specified complication, without long-term current use of insulin  (HCC)     Diabetes Mellitus type 2, complicated by CAD. - Diabetic status / severity: Controlled, significantly improved.  Lab Results  Component Value Date   HGBA1C 6.3 (A) 11/30/2023    - Hemoglobin A1c goal : <6.5%  Discussed about type 2 diabetes mellitus  and potential chronic  complications including diabetic retinopathy, neuropathy and nephropathy.  Discussed about importance of controlling blood sugar to prevent chronic complications.  Congratulated him for the significant improvement in diabetes control, encouraged him to stay on his lifestyle modification with diet plan and exercise.  Will gradually increase Mounjaro  and decrease insulin  therapy over time.  - Medications:   I) decrease Lantus  from 17 to 15 units daily. II) increase Mounjaro  from 2.5 to 5 mg weekly.  Patient is asked to call our clinic in 1 month if he feels comfortable in regard to GI issues, we will gradually increase the dose in between episodes. III) continue Farxiga  10 mg daily.  - Home glucose testing: At least in the morning fasting and sometime at bedtime.  Sent prescription for Dexcom G7.  Encouraged to use CGM. - Discussed/ Gave Hypoglycemia treatment plan.  # Consult : not required at this time.   # Annual urine for microalbuminuria/ creatinine ratio, no microalbuminuria currently. Last  Lab Results  Component Value Date   MICRALBCREAT 27 02/27/2023    # Foot check nightly.  # Annual dilated diabetic eye exams.   - Diet: Make healthy diabetic food choices - Life style / activity / exercise: Discussed.  2. Blood pressure  -  BP Readings from Last 1 Encounters:  11/30/23 136/80    - Control is in target.  - No change in current plans.  3. Lipid status / Hyperlipidemia - Last  Lab Results  Component Value Date   LDLCALC 54 02/22/2023   - Continue atorvastatin  80 mg daily.  Diagnoses and all orders for this visit:  Type 2 diabetes mellitus with other specified complication, with long-term current use of insulin  (HCC) -     POCT glycosylated hemoglobin (Hb A1C) -     tirzepatide  (MOUNJARO ) 5 MG/0.5ML Pen; Inject 5 mg into the skin once a week. -     Continuous Glucose Sensor (DEXCOM G7 SENSOR) MISC; 1 Device by Does not apply route continuous. -     dapagliflozin   propanediol (FARXIGA ) 10 MG TABS tablet; Take 1 tablet (10 mg total) by mouth daily before breakfast.  Controlled type 2 diabetes mellitus with other specified complication, without long-term current use of insulin  (HCC) -     insulin  glargine (LANTUS ) 100 unit/mL SOPN; Inject 15 Units into the skin at bedtime.    DISPOSITION Follow up in clinic in 3  months suggested.   All questions answered and patient verbalized understanding of the plan.  Joseph Miyanna Wiersma, MD [facility] Endocrinology [facility] [address] [place], [address] [place], [place] [address] Phone # [phone]  At least part of this note was generated using voice recognition software. Inadvertent word errors may have occurred, which were not recognized during the proofreading process.

## 2023-11-30 NOTE — Patient Instructions (Signed)
 Diabetes regimen:  Lantus  decrease to 15 units daily. Increase Mounjaro  to 29m g weekly. Farxiga  10mg  daily.

## 2023-12-19 ENCOUNTER — Telehealth: Payer: Self-pay

## 2023-12-19 NOTE — Telephone Encounter (Signed)
 Pt is scheduled to see Almarie Ferrari, NP tomorrow for a cpap f/u. I was unable to find pt in [facility]. I called and spoke to Parks from [facility]. She was unable to find a download due to pt not paying for his CPAP and they have tried several time to contact pt regarding this. The last time being in January of 2025.   I called and spoke to the pt. Pt states he is using the machine but would like to discuss inspire treatment. Pt is unsure if he has a SD card but will bring this in if he does. NFN

## 2023-12-20 ENCOUNTER — Ambulatory Visit: Admitting: Primary Care

## 2023-12-20 DIAGNOSIS — G4733 Obstructive sleep apnea (adult) (pediatric): Secondary | ICD-10-CM

## 2024-01-01 ENCOUNTER — Other Ambulatory Visit: Payer: Self-pay
